# Patient Record
Sex: Male | Born: 1965 | Race: White | Hispanic: No | Marital: Single | Smoking: Former smoker
Health system: Southern US, Community
[De-identification: ages and names within clinical notes are randomized; demographics above are authoritative.]

## PROBLEM LIST (undated history)

## (undated) DIAGNOSIS — B353 Tinea pedis: Secondary | ICD-10-CM

## (undated) DIAGNOSIS — G4730 Sleep apnea, unspecified: Secondary | ICD-10-CM

## (undated) DIAGNOSIS — E785 Hyperlipidemia, unspecified: Secondary | ICD-10-CM

## (undated) DIAGNOSIS — J309 Allergic rhinitis, unspecified: Secondary | ICD-10-CM

## (undated) DIAGNOSIS — J439 Emphysema, unspecified: Secondary | ICD-10-CM

## (undated) DIAGNOSIS — B070 Plantar wart: Secondary | ICD-10-CM

## (undated) DIAGNOSIS — T7840XA Allergy, unspecified, initial encounter: Secondary | ICD-10-CM

## (undated) DIAGNOSIS — I10 Essential (primary) hypertension: Secondary | ICD-10-CM

## (undated) DIAGNOSIS — N401 Enlarged prostate with lower urinary tract symptoms: Secondary | ICD-10-CM

## (undated) DIAGNOSIS — D1801 Hemangioma of skin and subcutaneous tissue: Secondary | ICD-10-CM

## (undated) DIAGNOSIS — Z8619 Personal history of other infectious and parasitic diseases: Secondary | ICD-10-CM

## (undated) DIAGNOSIS — J45909 Unspecified asthma, uncomplicated: Secondary | ICD-10-CM

## (undated) DIAGNOSIS — C801 Malignant (primary) neoplasm, unspecified: Secondary | ICD-10-CM

## (undated) DIAGNOSIS — M6740 Ganglion, unspecified site: Secondary | ICD-10-CM

## (undated) HISTORY — DX: Personal history of other infectious and parasitic diseases: Z86.19

## (undated) HISTORY — PX: WISDOM TOOTH EXTRACTION: SHX21

## (undated) HISTORY — DX: Hyperlipidemia, unspecified: E78.5

## (undated) HISTORY — DX: Hemangioma of skin and subcutaneous tissue: D18.01

## (undated) HISTORY — DX: Benign prostatic hyperplasia with lower urinary tract symptoms: N40.1

## (undated) HISTORY — DX: Allergy, unspecified, initial encounter: T78.40XA

## (undated) HISTORY — DX: Sleep apnea, unspecified: G47.30

## (undated) HISTORY — DX: Unspecified asthma, uncomplicated: J45.909

## (undated) HISTORY — DX: Allergic rhinitis, unspecified: J30.9

## (undated) HISTORY — DX: Tinea pedis: B35.3

## (undated) HISTORY — DX: Plantar wart: B07.0

## (undated) HISTORY — DX: Ganglion, unspecified site: M67.40

---

## 1999-04-07 ENCOUNTER — Inpatient Hospital Stay (HOSPITAL_COMMUNITY): Admission: EM | Admit: 1999-04-07 | Discharge: 1999-04-09 | Payer: Self-pay | Admitting: Emergency Medicine

## 1999-04-07 ENCOUNTER — Encounter: Payer: Self-pay | Admitting: Emergency Medicine

## 2005-08-17 ENCOUNTER — Encounter: Admission: RE | Admit: 2005-08-17 | Discharge: 2005-08-17 | Payer: Self-pay | Admitting: Family Medicine

## 2005-09-09 ENCOUNTER — Encounter: Admission: RE | Admit: 2005-09-09 | Discharge: 2005-09-09 | Payer: Self-pay | Admitting: Family Medicine

## 2006-05-02 ENCOUNTER — Ambulatory Visit: Payer: Self-pay | Admitting: Family Medicine

## 2006-09-22 ENCOUNTER — Ambulatory Visit: Payer: Self-pay | Admitting: Internal Medicine

## 2006-11-08 ENCOUNTER — Ambulatory Visit: Payer: Self-pay | Admitting: Internal Medicine

## 2006-11-08 LAB — CONVERTED CEMR LAB
ALT: 28 units/L (ref 0–40)
AST: 20 units/L (ref 0–37)
Albumin: 4.3 g/dL (ref 3.5–5.2)
Alkaline Phosphatase: 53 units/L (ref 39–117)
BUN: 14 mg/dL (ref 6–23)
Basophils Absolute: 0 10*3/uL (ref 0.0–0.1)
Basophils Relative: 0.1 % (ref 0.0–1.0)
Bilirubin, Direct: 0.1 mg/dL (ref 0.0–0.3)
CO2: 30 meq/L (ref 19–32)
Calcium: 9.2 mg/dL (ref 8.4–10.5)
Chloride: 105 meq/L (ref 96–112)
Creatinine, Ser: 1 mg/dL (ref 0.4–1.5)
Eosinophils Absolute: 0.1 10*3/uL (ref 0.0–0.6)
Eosinophils Relative: 1.4 % (ref 0.0–5.0)
GFR calc Af Amer: 107 mL/min
GFR calc non Af Amer: 88 mL/min
Glucose, Bld: 85 mg/dL (ref 70–99)
HCT: 42.4 % (ref 39.0–52.0)
Hemoglobin: 14.9 g/dL (ref 13.0–17.0)
Lymphocytes Relative: 21 % (ref 12.0–46.0)
MCHC: 35.2 g/dL (ref 30.0–36.0)
MCV: 97.3 fL (ref 78.0–100.0)
Monocytes Absolute: 1.4 10*3/uL — ABNORMAL HIGH (ref 0.2–0.7)
Monocytes Relative: 17.2 % — ABNORMAL HIGH (ref 3.0–11.0)
Neutro Abs: 4.7 10*3/uL (ref 1.4–7.7)
Neutrophils Relative %: 60.3 % (ref 43.0–77.0)
PSA: 0.6 ng/mL (ref 0.10–4.00)
Platelets: 251 10*3/uL (ref 150–400)
Potassium: 4.1 meq/L (ref 3.5–5.1)
RBC: 4.36 M/uL (ref 4.22–5.81)
RDW: 12.3 % (ref 11.5–14.6)
Sodium: 139 meq/L (ref 135–145)
TSH: 3.1 microintl units/mL (ref 0.35–5.50)
Total Bilirubin: 0.8 mg/dL (ref 0.3–1.2)
Total Protein: 6.7 g/dL (ref 6.0–8.3)
WBC: 7.9 10*3/uL (ref 4.5–10.5)

## 2006-11-17 ENCOUNTER — Ambulatory Visit: Payer: Self-pay | Admitting: Internal Medicine

## 2007-02-05 DIAGNOSIS — Z8619 Personal history of other infectious and parasitic diseases: Secondary | ICD-10-CM

## 2007-02-05 DIAGNOSIS — J45909 Unspecified asthma, uncomplicated: Secondary | ICD-10-CM

## 2007-02-05 DIAGNOSIS — J4530 Mild persistent asthma, uncomplicated: Secondary | ICD-10-CM | POA: Insufficient documentation

## 2007-02-05 DIAGNOSIS — F411 Generalized anxiety disorder: Secondary | ICD-10-CM | POA: Insufficient documentation

## 2007-02-05 HISTORY — DX: Personal history of other infectious and parasitic diseases: Z86.19

## 2007-02-05 HISTORY — DX: Unspecified asthma, uncomplicated: J45.909

## 2007-03-01 ENCOUNTER — Telehealth: Payer: Self-pay | Admitting: Internal Medicine

## 2007-03-29 ENCOUNTER — Ambulatory Visit: Payer: Self-pay | Admitting: Internal Medicine

## 2007-05-22 ENCOUNTER — Ambulatory Visit: Payer: Self-pay | Admitting: Internal Medicine

## 2007-05-22 DIAGNOSIS — N138 Other obstructive and reflux uropathy: Secondary | ICD-10-CM | POA: Insufficient documentation

## 2007-05-22 DIAGNOSIS — N401 Enlarged prostate with lower urinary tract symptoms: Secondary | ICD-10-CM | POA: Insufficient documentation

## 2007-05-22 HISTORY — DX: Benign prostatic hyperplasia with lower urinary tract symptoms: N40.1

## 2007-05-22 HISTORY — DX: Other obstructive and reflux uropathy: N13.8

## 2007-08-16 ENCOUNTER — Encounter: Payer: Self-pay | Admitting: Internal Medicine

## 2007-08-17 ENCOUNTER — Ambulatory Visit: Payer: Self-pay | Admitting: Internal Medicine

## 2007-08-17 DIAGNOSIS — L723 Sebaceous cyst: Secondary | ICD-10-CM | POA: Insufficient documentation

## 2007-09-17 ENCOUNTER — Encounter: Payer: Self-pay | Admitting: Internal Medicine

## 2007-11-14 ENCOUNTER — Ambulatory Visit: Payer: Self-pay | Admitting: Internal Medicine

## 2007-12-27 ENCOUNTER — Telehealth: Payer: Self-pay | Admitting: Internal Medicine

## 2008-01-10 ENCOUNTER — Ambulatory Visit: Payer: Self-pay | Admitting: Internal Medicine

## 2008-01-10 LAB — CONVERTED CEMR LAB
ALT: 36 units/L (ref 0–53)
AST: 22 units/L (ref 0–37)
Albumin: 4.3 g/dL (ref 3.5–5.2)
Alkaline Phosphatase: 59 units/L (ref 39–117)
BUN: 20 mg/dL (ref 6–23)
Basophils Absolute: 0.1 10*3/uL (ref 0.0–0.1)
Basophils Relative: 0.9 % (ref 0.0–3.0)
Bilirubin Urine: NEGATIVE
Bilirubin, Direct: 0.1 mg/dL (ref 0.0–0.3)
Blood in Urine, dipstick: NEGATIVE
CO2: 30 meq/L (ref 19–32)
Calcium: 9.4 mg/dL (ref 8.4–10.5)
Chloride: 104 meq/L (ref 96–112)
Cholesterol: 193 mg/dL (ref 0–200)
Creatinine, Ser: 1.1 mg/dL (ref 0.4–1.5)
Eosinophils Absolute: 0.1 10*3/uL (ref 0.0–0.7)
Eosinophils Relative: 1.6 % (ref 0.0–5.0)
GFR calc Af Amer: 95 mL/min
GFR calc non Af Amer: 79 mL/min
Glucose, Bld: 88 mg/dL (ref 70–99)
Glucose, Urine, Semiquant: NEGATIVE
HCT: 46 % (ref 39.0–52.0)
HDL: 41.2 mg/dL (ref >39.0)
Hemoglobin: 16 g/dL (ref 13.0–17.0)
Ketones, urine, test strip: NEGATIVE
LDL Cholesterol: 137 mg/dL — ABNORMAL HIGH (ref 0–99)
Lymphocytes Relative: 28.1 % (ref 12.0–46.0)
MCHC: 34.7 g/dL (ref 30.0–36.0)
MCV: 100.2 fL — ABNORMAL HIGH (ref 78.0–100.0)
Monocytes Absolute: 1 10*3/uL (ref 0.1–1.0)
Monocytes Relative: 15.4 % — ABNORMAL HIGH (ref 3.0–12.0)
Neutro Abs: 3.3 10*3/uL (ref 1.4–7.7)
Neutrophils Relative %: 54 % (ref 43.0–77.0)
Nitrite: NEGATIVE
PSA: 0.45 ng/mL (ref 0.10–4.00)
Platelets: 293 10*3/uL (ref 150–400)
Potassium: 4.1 meq/L (ref 3.5–5.1)
Protein, U semiquant: NEGATIVE
RBC: 4.59 M/uL (ref 4.22–5.81)
RDW: 12.7 % (ref 11.5–14.6)
Sodium: 141 meq/L (ref 135–145)
Specific Gravity, Urine: 1.015
TSH: 1.76 microintl units/mL (ref 0.35–5.50)
Total Bilirubin: 1 mg/dL (ref 0.3–1.2)
Total CHOL/HDL Ratio: 4.7
Total Protein: 6.7 g/dL (ref 6.0–8.3)
Triglycerides: 76 mg/dL (ref 0–149)
Urobilinogen, UA: 1
VLDL: 15 mg/dL (ref 0–40)
WBC: 6.3 10*3/uL (ref 4.5–10.5)
pH: 7

## 2008-01-17 ENCOUNTER — Ambulatory Visit: Payer: Self-pay | Admitting: Internal Medicine

## 2008-03-27 ENCOUNTER — Ambulatory Visit: Payer: Self-pay | Admitting: Internal Medicine

## 2008-04-15 ENCOUNTER — Ambulatory Visit: Payer: Self-pay | Admitting: Internal Medicine

## 2008-04-15 LAB — CONVERTED CEMR LAB
Cholesterol: 184 mg/dL (ref 0–200)
HDL: 42.1 mg/dL (ref >39.0)
LDL Cholesterol: 130 mg/dL — ABNORMAL HIGH (ref 0–99)
Total CHOL/HDL Ratio: 4.4
Triglycerides: 62 mg/dL (ref 0–149)
VLDL: 12 mg/dL (ref 0–40)

## 2008-04-21 ENCOUNTER — Ambulatory Visit: Payer: Self-pay | Admitting: Internal Medicine

## 2008-04-21 DIAGNOSIS — I10 Essential (primary) hypertension: Secondary | ICD-10-CM | POA: Insufficient documentation

## 2008-04-21 DIAGNOSIS — E785 Hyperlipidemia, unspecified: Secondary | ICD-10-CM

## 2008-04-21 HISTORY — DX: Hyperlipidemia, unspecified: E78.5

## 2008-08-13 ENCOUNTER — Ambulatory Visit: Payer: Self-pay | Admitting: Internal Medicine

## 2008-08-13 LAB — CONVERTED CEMR LAB
ALT: 36 units/L (ref 0–53)
AST: 23 units/L (ref 0–37)
Albumin: 4.1 g/dL (ref 3.5–5.2)
Alkaline Phosphatase: 53 units/L (ref 39–117)
Bilirubin, Direct: 0.2 mg/dL (ref 0.0–0.3)
Cholesterol: 164 mg/dL (ref 0–200)
HDL: 41.2 mg/dL (ref >39.0)
LDL Cholesterol: 112 mg/dL — ABNORMAL HIGH (ref 0–99)
Total Bilirubin: 0.7 mg/dL (ref 0.3–1.2)
Total CHOL/HDL Ratio: 4
Total Protein: 6.4 g/dL (ref 6.0–8.3)
Triglycerides: 56 mg/dL (ref 0–149)
VLDL: 11 mg/dL (ref 0–40)

## 2008-08-20 ENCOUNTER — Telehealth: Payer: Self-pay | Admitting: Internal Medicine

## 2008-08-20 ENCOUNTER — Ambulatory Visit: Payer: Self-pay | Admitting: Internal Medicine

## 2009-01-16 ENCOUNTER — Ambulatory Visit: Payer: Self-pay | Admitting: Internal Medicine

## 2009-01-16 LAB — CONVERTED CEMR LAB
ALT: 29 units/L (ref 0–53)
AST: 19 units/L (ref 0–37)
Albumin: 4.3 g/dL (ref 3.5–5.2)
Alkaline Phosphatase: 55 units/L (ref 39–117)
BUN: 16 mg/dL (ref 6–23)
Basophils Absolute: 0 10*3/uL (ref 0.0–0.1)
Basophils Relative: 0 % (ref 0.0–3.0)
Bilirubin Urine: NEGATIVE
Bilirubin, Direct: 0 mg/dL (ref 0.0–0.3)
Blood in Urine, dipstick: NEGATIVE
CO2: 33 meq/L — ABNORMAL HIGH (ref 19–32)
Calcium: 9.3 mg/dL (ref 8.4–10.5)
Chloride: 106 meq/L (ref 96–112)
Cholesterol: 177 mg/dL (ref 0–200)
Creatinine, Ser: 1.1 mg/dL (ref 0.4–1.5)
Eosinophils Absolute: 0.1 10*3/uL (ref 0.0–0.7)
Eosinophils Relative: 0.9 % (ref 0.0–5.0)
GFR calc non Af Amer: 78.27 mL/min (ref >60)
Glucose, Bld: 92 mg/dL (ref 70–99)
Glucose, Urine, Semiquant: NEGATIVE
HCT: 44.2 % (ref 39.0–52.0)
HDL: 42.6 mg/dL (ref >39.00)
Hemoglobin: 15.4 g/dL (ref 13.0–17.0)
Ketones, urine, test strip: NEGATIVE
LDL Cholesterol: 118 mg/dL — ABNORMAL HIGH (ref 0–99)
Lymphocytes Relative: 24.3 % (ref 12.0–46.0)
Lymphs Abs: 1.5 10*3/uL (ref 0.7–4.0)
MCHC: 34.8 g/dL (ref 30.0–36.0)
MCV: 98.8 fL (ref 78.0–100.0)
Monocytes Absolute: 0.8 10*3/uL (ref 0.1–1.0)
Monocytes Relative: 12.9 % — ABNORMAL HIGH (ref 3.0–12.0)
Neutro Abs: 3.8 10*3/uL (ref 1.4–7.7)
Neutrophils Relative %: 61.9 % (ref 43.0–77.0)
Nitrite: NEGATIVE
PSA: 0.25 ng/mL (ref 0.10–4.00)
Platelets: 235 10*3/uL (ref 150.0–400.0)
Potassium: 4.3 meq/L (ref 3.5–5.1)
Protein, U semiquant: NEGATIVE
RBC: 4.47 M/uL (ref 4.22–5.81)
RDW: 12 % (ref 11.5–14.6)
Sodium: 144 meq/L (ref 135–145)
Specific Gravity, Urine: 1.02
TSH: 2.88 microintl units/mL (ref 0.35–5.50)
Total Bilirubin: 1 mg/dL (ref 0.3–1.2)
Total CHOL/HDL Ratio: 4
Total Protein: 6.8 g/dL (ref 6.0–8.3)
Triglycerides: 83 mg/dL (ref 0.0–149.0)
Urobilinogen, UA: 0.2
VLDL: 16.6 mg/dL (ref 0.0–40.0)
WBC Urine, dipstick: NEGATIVE
WBC: 6.2 10*3/uL (ref 4.5–10.5)
pH: 7

## 2009-01-23 ENCOUNTER — Ambulatory Visit: Payer: Self-pay | Admitting: Internal Medicine

## 2009-01-23 DIAGNOSIS — B070 Plantar wart: Secondary | ICD-10-CM | POA: Insufficient documentation

## 2009-01-23 HISTORY — DX: Plantar wart: B07.0

## 2009-02-24 ENCOUNTER — Ambulatory Visit: Payer: Self-pay | Admitting: Internal Medicine

## 2009-02-24 DIAGNOSIS — J309 Allergic rhinitis, unspecified: Secondary | ICD-10-CM

## 2009-02-24 HISTORY — DX: Allergic rhinitis, unspecified: J30.9

## 2009-03-25 ENCOUNTER — Ambulatory Visit: Payer: Self-pay | Admitting: Internal Medicine

## 2009-03-25 DIAGNOSIS — M6740 Ganglion, unspecified site: Secondary | ICD-10-CM | POA: Insufficient documentation

## 2009-03-25 HISTORY — DX: Ganglion, unspecified site: M67.40

## 2009-04-20 ENCOUNTER — Ambulatory Visit: Payer: Self-pay | Admitting: Internal Medicine

## 2009-06-30 ENCOUNTER — Ambulatory Visit: Payer: Self-pay | Admitting: Internal Medicine

## 2009-06-30 ENCOUNTER — Telehealth: Payer: Self-pay | Admitting: Internal Medicine

## 2009-06-30 DIAGNOSIS — D1801 Hemangioma of skin and subcutaneous tissue: Secondary | ICD-10-CM

## 2009-06-30 HISTORY — DX: Hemangioma of skin and subcutaneous tissue: D18.01

## 2009-08-03 ENCOUNTER — Ambulatory Visit: Payer: Self-pay | Admitting: Internal Medicine

## 2009-08-17 ENCOUNTER — Ambulatory Visit: Payer: Self-pay | Admitting: Internal Medicine

## 2009-08-18 ENCOUNTER — Encounter: Payer: Self-pay | Admitting: Internal Medicine

## 2009-09-07 ENCOUNTER — Ambulatory Visit: Payer: Self-pay | Admitting: Internal Medicine

## 2009-10-13 ENCOUNTER — Ambulatory Visit: Payer: Self-pay | Admitting: Internal Medicine

## 2009-11-02 ENCOUNTER — Ambulatory Visit: Payer: Self-pay | Admitting: Internal Medicine

## 2009-11-02 DIAGNOSIS — B353 Tinea pedis: Secondary | ICD-10-CM | POA: Insufficient documentation

## 2009-11-02 HISTORY — DX: Tinea pedis: B35.3

## 2009-11-30 ENCOUNTER — Ambulatory Visit: Payer: Self-pay | Admitting: Internal Medicine

## 2009-12-25 ENCOUNTER — Telehealth: Payer: Self-pay | Admitting: Internal Medicine

## 2010-01-01 ENCOUNTER — Ambulatory Visit: Payer: Self-pay | Admitting: Internal Medicine

## 2010-01-18 ENCOUNTER — Ambulatory Visit: Payer: Self-pay | Admitting: Internal Medicine

## 2010-01-18 LAB — CONVERTED CEMR LAB
ALT: 29 units/L (ref 0–53)
AST: 21 units/L (ref 0–37)
Albumin: 4.1 g/dL (ref 3.5–5.2)
Alkaline Phosphatase: 57 units/L (ref 39–117)
BUN: 19 mg/dL (ref 6–23)
Basophils Absolute: 0 10*3/uL (ref 0.0–0.1)
Basophils Relative: 0.5 % (ref 0.0–3.0)
Bilirubin, Direct: 0.1 mg/dL (ref 0.0–0.3)
Blood in Urine, dipstick: NEGATIVE
CO2: 32 meq/L (ref 19–32)
Calcium: 9 mg/dL (ref 8.4–10.5)
Chloride: 104 meq/L (ref 96–112)
Cholesterol: 184 mg/dL (ref 0–200)
Creatinine, Ser: 1.1 mg/dL (ref 0.4–1.5)
Eosinophils Absolute: 0.1 10*3/uL (ref 0.0–0.7)
Eosinophils Relative: 1.1 % (ref 0.0–5.0)
GFR calc non Af Amer: 77.08 mL/min (ref >60)
Glucose, Bld: 86 mg/dL (ref 70–99)
Glucose, Urine, Semiquant: NEGATIVE
HCT: 42.4 % (ref 39.0–52.0)
HDL: 47.1 mg/dL (ref >39.00)
Hemoglobin: 14.6 g/dL (ref 13.0–17.0)
Ketones, urine, test strip: NEGATIVE
LDL Cholesterol: 125 mg/dL — ABNORMAL HIGH (ref 0–99)
Lymphocytes Relative: 26.4 % (ref 12.0–46.0)
Lymphs Abs: 1.7 10*3/uL (ref 0.7–4.0)
MCHC: 34.6 g/dL (ref 30.0–36.0)
MCV: 99.3 fL (ref 78.0–100.0)
Monocytes Absolute: 0.9 10*3/uL (ref 0.1–1.0)
Monocytes Relative: 14.3 % — ABNORMAL HIGH (ref 3.0–12.0)
Neutro Abs: 3.7 10*3/uL (ref 1.4–7.7)
Neutrophils Relative %: 57.7 % (ref 43.0–77.0)
Nitrite: NEGATIVE
PSA: 0.53 ng/mL (ref 0.10–4.00)
Platelets: 253 10*3/uL (ref 150.0–400.0)
Potassium: 3.9 meq/L (ref 3.5–5.1)
Protein, U semiquant: NEGATIVE
RBC: 4.27 M/uL (ref 4.22–5.81)
RDW: 13.5 % (ref 11.5–14.6)
Sodium: 143 meq/L (ref 135–145)
Specific Gravity, Urine: >=1.03
TSH: 3.38 microintl units/mL (ref 0.35–5.50)
Total Bilirubin: 0.4 mg/dL (ref 0.3–1.2)
Total CHOL/HDL Ratio: 4
Total Protein: 6.5 g/dL (ref 6.0–8.3)
Triglycerides: 58 mg/dL (ref 0.0–149.0)
Urobilinogen, UA: 1
VLDL: 11.6 mg/dL (ref 0.0–40.0)
WBC Urine, dipstick: NEGATIVE
WBC: 6.4 10*3/uL (ref 4.5–10.5)
pH: 5.5

## 2010-01-21 LAB — CONVERTED CEMR LAB

## 2010-01-25 ENCOUNTER — Ambulatory Visit: Payer: Self-pay | Admitting: Internal Medicine

## 2010-07-11 LAB — CONVERTED CEMR LAB

## 2010-07-13 ENCOUNTER — Encounter: Payer: Self-pay | Admitting: Internal Medicine

## 2010-07-13 NOTE — Progress Notes (Signed)
Summary: additional lab test  Phone Note Call from Patient Call back at [phone]   Caller: Patient Call For: dr Lovell Sheehan Summary of Call: pt is sch for cpx labs on 01-10-08 pt would like to have hiv test included with labs.Can I sch ? Initial call taken by: Heron Sabins,  December 27, 2007 1:31 PM  Follow-up for Phone Call        yes per dr Lovell Sheehan Follow-up by: Willy Eddy, LPN,  December 27, 2007 1:59 PM  Additional Follow-up for Phone Call Additional follow up Details #1::        pt is aware Additional Follow-up by: Heron Sabins,  December 28, 2007 7:59 AM    0

## 2010-07-13 NOTE — Progress Notes (Signed)
Summary: additional lab test  Phone Note Call from Patient Call back at Home Phone [phone]   Caller: Patient Call For: jenkins Summary of Call: pt is sch for cpx labs 01-16-2009 pt would like hiv added to bloodwork can i add? Initial call taken by: Heron Sabins,  August 20, 2008 4:11 PM  Follow-up for Phone Call        yes you may per dr Lovell Sheehan Follow-up by: Willy Eddy, LPN,  August 20, 2008 4:21 PM  Additional Follow-up for Phone Call Additional follow up Details #1::        pt is aware Additional Follow-up by: Heron Sabins,  August 20, 2008 5:11 PM

## 2010-07-13 NOTE — Assessment & Plan Note (Signed)
Summary: 4 week fup//ccm   Vital Signs:  Patient profile:   45 year old male Height:      68 inches Weight:      161 pounds BMI:     24.57 Temp:     98.2 degrees F oral Pulse rate:   72 / minute Resp:     14 per minute BP sitting:   130 / 80  (left arm)  Vitals Entered By: Willy Eddy, LPN (November 30, 2009 12:10 PM) CC: check feet   CC:  check feet.  History of Present Illness: wart therapy  Preventive Screening-Counseling & Management  Alcohol-Tobacco     Smoking Status: current  Current Problems (verified): 1)  Dermatophytosis of Foot  (ICD-110.4) 2)  Hemangioma of Skin and Subcutaneous Tissue  (ICD-228.01) 3)  Ganglion Cyst, Wrist, Left  (ICD-727.41) 4)  Allergic Rhinitis Cause Unspecified  (ICD-477.9) 5)  Plantar Wart, Right  (ICD-078.12) 6)  Hyperlipidemia, Borderline  (ICD-272.4) 7)  Physical Examination  (ICD-V70.0) 8)  Uri  (ICD-465.9) 9)  Sebaceous Cyst, Scalp  (ICD-706.2) 10)  Benign Prostatic Hypertrophy, With Urinary Obstruction  (ICD-600.01) 11)  Hepatitis B, Hx of  (ICD-V12.09) 12)  Depression  (ICD-311) 13)  Asthma  (ICD-493.90)  Current Medications (verified): 1)  Wellbutrin Sr 150 Mg Tb12 (Bupropion Hcl) .... Take 2 Tablet By Mouth Once A Day 2)  Xanax 0.25 Mg Tabs (Alprazolam) .... 1/2 Tab As Needed 3)  Eq Ibuprofen 200 Mg  Caps (Ibuprofen) .... As Needed 4)  Aldara 5 % Crea (Imiquimod) .... To Research 5)  Xyzal 5 Mg Tabs (Levocetirizine Dihydrochloride) .... One  By Mouth Two Times A Day As Needed Sever Allergies ( Please Give Generic Drug) 6)  Lotrisone 1-0.05 % Lotn (Clotrimazole-Betamethasone) .... Generic Apply To Foot Bid  Allergies (verified): No Known Drug Allergies   Impression & Recommendations:  Problem # 1:  PLANTAR WART, RIGHT (ICD-078.12)  the lesion was identifies as a        plantar r warts  and 40 seconds of cryotherapy with the liguid nitrogen gun was apllied to the site. The pt tolerated the procedure and post  procedure care was discussed  Orders: No Charge Patient Arrived (NCPA0) (NCPA0) Wart Destruct <14 (17110)  Complete Medication List: 1)  Wellbutrin Sr 150 Mg Tb12 (Bupropion hcl) .... Take 2 tablet by mouth once a day 2)  Xanax 0.25 Mg Tabs (Alprazolam) .... 1/2 tab as needed 3)  Eq Ibuprofen 200 Mg Caps (Ibuprofen) .... As needed 4)  Aldara 5 % Crea (Imiquimod) .... To research 5)  Xyzal 5 Mg Tabs (Levocetirizine dihydrochloride) .... One  by mouth two times a day as needed sever allergies ( please give generic drug) 6)  Lotrisone 1-0.05 % Lotn (Clotrimazole-betamethasone) .... Generic apply to foot bid  Patient Instructions: 1)  Please schedule a follow-up appointment in 1 month.

## 2010-07-13 NOTE — Assessment & Plan Note (Signed)
Summary: 1 month rov/njr/check finger/add flu shot//ccm   Vital Signs:  Patient profile:   45 year old male Height:      68 inches Weight:      156 pounds BMI:     23.81 Temp:     98.2 degrees F oral Pulse rate:   72 / minute Resp:     14 per minute BP sitting:   110 / 80  (left arm)  Vitals Entered By: Willy Eddy, LPN (March 25, 2009 10:04 AM)  CC:  cryo therapy toe-pt tetanus vaccine was given in 2008-son has only been seen by dr Lovell Sheehan one time and there is no tetanus shot showing ) checked  registry and he was not listed.  History of Present Illness: Presents for follow up visit for medication management for asthma and lipids as well as acute complaits of ganglion cyst on wrist and a recurrent wart. New problems as noted. Use of medications inclused allergy and asthma rescue meds was stable   Follow-Up Visit      This is a 45 year old man who presents for Follow-up visit.  The patient denies chest pain, palpitations, dizziness, syncope, low blood sugar symptoms, high blood sugar symptoms, edema, SOB, DOE, PND, and orthopnea.  The patient reports taking meds as prescribed and dietary compliance.  When questioned about possible medication side effects, the patient notes none.    Problems Prior to Update: 1)  Ganglion Cyst, Wrist, Left  (ICD-727.41) 2)  Allergic Rhinitis Cause Unspecified  (ICD-477.9) 3)  Plantar Wart, Right  (ICD-078.12) 4)  Hyperlipidemia, Borderline  (ICD-272.4) 5)  Physical Examination  (ICD-V70.0) 6)  Uri  (ICD-465.9) 7)  Sebaceous Cyst, Scalp  (ICD-706.2) 8)  Benign Prostatic Hypertrophy, With Urinary Obstruction  (ICD-600.01) 9)  Hepatitis B, Hx of  (ICD-V12.09) 10)  Depression  (ICD-311) 11)  Asthma  (ICD-493.90)  Medications Prior to Update: 1)  Wellbutrin Sr 150 Mg Tb12 (Bupropion Hcl) .... Take 2 Tablet By Mouth Once A Day 2)  Xanax 0.25 Mg Tabs (Alprazolam) .... 1/2 Tab As Needed 3)  Avodart 0.5 Mg  Caps (Dutasteride) .... One By  Mouth Daily 4)  Eq Ibuprofen 200 Mg  Caps (Ibuprofen) .... As Needed 5)  Allegra 180 Mg Tabs (Fexofenadine Hcl) .Marland Kitchen.. 1 Tablet By Mouth Once Daily  Current Medications (verified): 1)  Wellbutrin Sr 150 Mg Tb12 (Bupropion Hcl) .... Take 2 Tablet By Mouth Once A Day 2)  Xanax 0.25 Mg Tabs (Alprazolam) .... 1/2 Tab As Needed 3)  Avodart 0.5 Mg  Caps (Dutasteride) .... One By Mouth Daily 4)  Eq Ibuprofen 200 Mg  Caps (Ibuprofen) .... As Needed  Allergies (verified): No Known Drug Allergies  Past History:  Family History: Last updated: 01/17/2008 Family History of Arthritis Family History Hypertension Family History of Prostate CA 1st degree relative <50 Family History of Stroke F 1st degree relative <60 pancreatic cancer  Social History: Last updated: 11/14/2007 Occupation: Divorced Current Smoker---episodic  Risk Factors: Smoking Status: current (02/05/2007)  Past medical, surgical, family and social histories (including risk factors) reviewed, and no changes noted (except as noted below).  Past Medical History: Reviewed history from 01/17/2008 and no changes required. 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 Denies surgical history  Family History: Reviewed history from 01/17/2008 and no changes required. Family History of Arthritis Family History Hypertension Family History of Prostate CA 1st degree  relative <50 Family History of Stroke F 1st degree relative <60 pancreatic cancer  Social History: Reviewed history from 11/14/2007 and no changes required. Occupation: Divorced Current Smoker---episodic  Review of Systems  The patient denies anorexia, fever, weight loss, weight gain, vision loss, decreased hearing, hoarseness, chest pain, syncope, dyspnea on exertion, peripheral edema, prolonged cough, headaches, hemoptysis, abdominal pain, melena,  hematochezia, severe indigestion/heartburn, hematuria, incontinence, genital sores, muscle weakness, suspicious skin lesions, transient blindness, difficulty walking, depression, unusual weight change, abnormal bleeding, enlarged lymph nodes, angioedema, breast masses, and testicular masses.         Flu Vaccine Consent Questions     Do you have a history of severe allergic reactions to this vaccine? no    Any prior history of allergic reactions to egg and/or gelatin? no    Do you have a sensitivity to the preservative Thimersol? no    Do you have a past history of Guillan-Barre Syndrome? no    Do you currently have an acute febrile illness? no    Have you ever had a severe reaction to latex? no    Vaccine information given and explained to patient? yes    Are you currently pregnant? no    Lot [id number]   Exp Date:09/10/2009   Manufacturer: [employer]    Site Given  Left Deltoid IM   Physical Exam  General:  Well-developed,well-nourished,in no acute distress; alert,appropriate and cooperative throughout examination Head:  normocephalic and no abnormalities observed.   Eyes:  pupils equal and pupils round.   Ears:  R ear normal and L ear normal.   Nose:  nasal dischargemucosal pallor, mucosal erythema, mucosal edema, and airflow obstruction.   Neck:  No deformities, masses, or tenderness noted. Lungs:  normal respiratory effort and no wheezes.   Heart:  normal rate and regular rhythm.   Abdomen:  soft and non-tender.   Msk:  normal ROM, joint tenderness, and joint swelling.  at site of cyst  Neurologic:  alert & oriented X3 and cranial nerves II-XII intact.     Impression & Recommendations:  Problem # 1:  PLANTAR WART, RIGHT (ICD-078.12) wart on right foot recurrent Orders:the lesion was identifies as a wart          and 40 seconds of cryotherapy with the liguid nitrogen gun was apllied to the site. The pt tolerated the procedure and post procedure care was discussed  Wart  Destruct <14 (17110)  Problem # 2:  GANGLION CYST, WRIST, LEFT (ICD-727.41)  Informed consent obtained and the site prepped with betadine, using a  18 guage nedle and syringe the cyst was drained  Orders: Joint Aspirate / Injection, Small (81191)  Problem # 3:  ASTHMA (ICD-493.90) stable reveiwd use of rescue inhailer  Problem # 4:  ALLERGIC RHINITIS CAUSE UNSPECIFIED (ICD-477.9) stable  Complete Medication List: 1)  Wellbutrin Sr 150 Mg Tb12 (Bupropion hcl) .... Take 2 tablet by mouth once a day 2)  Xanax 0.25 Mg Tabs (Alprazolam) .... 1/2 tab as needed 3)  Avodart 0.5 Mg Caps (Dutasteride) .... One by mouth daily 4)  Eq Ibuprofen 200 Mg Caps (Ibuprofen) .... As needed  Other Orders: Flu Vaccine 75yrs + (47829) Administration Flu vaccine - MCR (F6213)  Patient Instructions: 1)  Please schedule a follow-up appointment in 3 weeks.

## 2010-07-13 NOTE — Assessment & Plan Note (Signed)
Summary: 3-4 wk plantar wart therapy/njr   Vital Signs:  Patient profile:   45 year old male Weight:      154 pounds Temp:     98.0 degrees F Pulse rate:   77 / minute BP sitting:   110 / 70  CC:  URI symptoms.  History of Present Illness: Allergies with significnat vasomotor allergies  URI Symptoms      This is a 45 year old man who presents with URI symptoms.  The patient reports nasal congestion and clear nasal discharge.  The patient denies fever, low-grade fever (<100.5 degrees), fever of 100.5-103 degrees, fever of 103.1-104 degrees, fever to >104 degrees, stiff neck, dyspnea, wheezing, rash, vomiting, diarrhea, use of an antipyretic, and response to antipyretic.  The patient also reports itchy watery eyes, itchy throat, and sneezing.  The patient denies the following risk factors for Strep sinusitis: unilateral facial pain, unilateral nasal discharge, poor response to decongestant, double sickening, tooth pain, Strep exposure, tender adenopathy, and absence of cough.    Problems Prior to Update: 1)  Plantar Wart, Right  (ICD-078.12) 2)  Hyperlipidemia, Borderline  (ICD-272.4) 3)  Physical Examination  (ICD-V70.0) 4)  Uri  (ICD-465.9) 5)  Sebaceous Cyst, Scalp  (ICD-706.2) 6)  Benign Prostatic Hypertrophy, With Urinary Obstruction  (ICD-600.01) 7)  Hepatitis B, Hx of  (ICD-V12.09) 8)  Depression  (ICD-311) 9)  Asthma  (ICD-493.90)  Medications Prior to Update: 1)  Wellbutrin Sr 150 Mg Tb12 (Bupropion Hcl) .... Take 2 Tablet By Mouth Once A Day 2)  Xanax 0.25 Mg Tabs (Alprazolam) .... 1/2 Tab As Needed 3)  Avodart 0.5 Mg  Caps (Dutasteride) .... One By Mouth Daily 4)  Eq Ibuprofen 200 Mg  Caps (Ibuprofen) .... As Needed  Current Medications (verified): 1)  Wellbutrin Sr 150 Mg Tb12 (Bupropion Hcl) .... Take 2 Tablet By Mouth Once A Day 2)  Xanax 0.25 Mg Tabs (Alprazolam) .... 1/2 Tab As Needed 3)  Avodart 0.5 Mg  Caps (Dutasteride) .... One By Mouth Daily 4)  Eq Ibuprofen  200 Mg  Caps (Ibuprofen) .... As Needed  Allergies (verified): No Known Drug Allergies  Past History:  Family History: Last updated: 01/17/2008 Family History of Arthritis Family History Hypertension Family History of Prostate CA 1st degree relative <50 Family History of Stroke F 1st degree relative <60 pancreatic cancer  Social History: Last updated: 11/14/2007 Occupation: Divorced Current Smoker---episodic  Risk Factors: Smoking Status: current (02/05/2007)  Past medical, surgical, family and social histories (including risk factors) reviewed, and no changes noted (except as noted below).  Past Medical History: Reviewed history from 01/17/2008 and no changes required. 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 Denies surgical history  Family History: Reviewed history from 01/17/2008 and no changes required. Family History of Arthritis Family History Hypertension Family History of Prostate CA 1st degree relative <50 Family History of Stroke F 1st degree relative <60 pancreatic cancer  Social History: Reviewed history from 11/14/2007 and no changes required. Occupation: Divorced Current Smoker---episodic  Review of Systems  The patient denies anorexia, fever, weight loss, weight gain, vision loss, decreased hearing, hoarseness, chest pain, syncope, dyspnea on exertion, peripheral edema, prolonged cough, headaches, hemoptysis, abdominal pain, melena, hematochezia, hematuria, incontinence, genital sores, muscle weakness, suspicious skin lesions, transient blindness, difficulty walking, depression, unusual weight change, abnormal bleeding, enlarged lymph nodes, angioedema, and breast masses.    Physical Exam  General:  Well-developed,well-nourished,in no acute distress; alert,appropriate and cooperative throughout examination Ears:  External ear exam  shows no significant lesions or deformities.  Otoscopic examination reveals clear canals, tympanic membranes are intact bilaterally without bulging, retraction, inflammation or discharge. Hearing is grossly normal bilaterally. Nose:  nasal dischargemucosal pallor, mucosal erythema, mucosal edema, and airflow obstruction.   Mouth:  Oral mucosa and oropharynx without lesions or exudates.  Teeth in good repair. Neck:  No deformities, masses, or tenderness noted. Lungs:  Normal respiratory effort, chest expands symmetrically. Lungs are clear to auscultation, no crackles or wheezes. Heart:  Normal rate and regular rhythm. S1 and S2 normal without gallop, murmur, click, rub or other extra sounds. Skin:  wart on great to of rght fot   Impression & Recommendations:  Problem # 1:  ALLERGIC RHINITIS CAUSE UNSPECIFIED (ICD-477.9) Assessment Unchanged increased seasonla allergies Discussed use of allergy medications and environmental measures.   His updated medication list for this problem includes:    Allegra 180 Mg Tabs (Fexofenadine hcl) .Marland Kitchen... 1 tablet by mouth once daily  Problem # 2:  PLANTAR WART, RIGHT (ICD-078.12)  the lesion was identifies as a   planter wart       and 40 seconds of cryotherapy with the liguid nitrogen gun was apllied to the site. The pt tolerated the procedure and post procedure care was discussed  Orders: Cryotherapy/Destruction benign or premalignant lesion (1st lesion)  (17000)  Complete Medication List: 1)  Wellbutrin Sr 150 Mg Tb12 (Bupropion hcl) .... Take 2 tablet by mouth once a day 2)  Xanax 0.25 Mg Tabs (Alprazolam) .... 1/2 tab as needed 3)  Avodart 0.5 Mg Caps (Dutasteride) .... One by mouth daily 4)  Eq Ibuprofen 200 Mg Caps (Ibuprofen) .... As needed 5)  Allegra 180 Mg Tabs (Fexofenadine hcl) .Marland Kitchen.. 1 tablet by mouth once daily  Patient Instructions: 1)  Please schedule a follow-up appointment in 1 month. Prescriptions: ALLEGRA 180 MG TABS (FEXOFENADINE  HCL) 1 tablet by mouth once daily  #30 x 11   Entered and Authorized by:   Stacie Glaze MD   Signed by:   Stacie Glaze MD on 02/24/2009   Method used:   Electronically to        [facility] [address] [phone]* (retail)       [address]       [place], [place]  [address]       Ph: [phone] or [phone]       Fax: [phone]   RxID:   [id number]

## 2010-07-13 NOTE — Progress Notes (Signed)
Summary: ADDITIONAL LABS  Phone Note Call from Patient Call back at Home Phone [phone]   Caller: Patient Call For: Stacie Glaze MD Complaint: Urinary/GYN Problems Summary of Call: Pt is sch for cpx labs on 01-18-2010 pt would like psa and hiv added to lab work can I sch? Initial call taken by: Heron Sabins,  June 30, 2009 10:45 AM  Follow-up for Phone Call        yes Follow-up by: Willy Eddy, LPN,  June 30, 2009 10:46 AM  Additional Follow-up for Phone Call Additional follow up Details #1::        LABS HAS BEEN ADDED Additional Follow-up by: Heron Sabins,  June 30, 2009 11:00 AM

## 2010-07-13 NOTE — Assessment & Plan Note (Signed)
Summary: cpx/njr   Vital Signs:  Patient profile:   45 year old male Height:      68 inches Weight:      156 pounds BMI:     23.81 Temp:     98.3 degrees F oral Pulse rate:   76 / minute Resp:     12 per minute BP sitting:   120 / 80  (left arm)  Vitals Entered By: Willy Eddy, LPN (January 25, 2010 8:42 AM) CC: cpx Is Patient Diabetic? No   CC:  cpx.  History of Present Illness: The pt was asked about all immunizations, health maint. services that are appropriate to their age and was given guidance on diet exercize  and weight management   Preventive Screening-Counseling & Management  Alcohol-Tobacco     Smoking Status: current     Packs/Day: <1 pack a week     Tobacco Counseling: to quit use of tobacco products  Comments: pt thinks that episodic smoking is OK since he smokes less than one pack a week  Current Problems (verified): 1)  Dermatophytosis of Foot  (ICD-110.4) 2)  Hemangioma of Skin and Subcutaneous Tissue  (ICD-228.01) 3)  Ganglion Cyst, Wrist, Left  (ICD-727.41) 4)  Allergic Rhinitis Cause Unspecified  (ICD-477.9) 5)  Plantar Wart, Right  (ICD-078.12) 6)  Hyperlipidemia, Borderline  (ICD-272.4) 7)  Physical Examination  (ICD-V70.0) 8)  Uri  (ICD-465.9) 9)  Sebaceous Cyst, Scalp  (ICD-706.2) 10)  Benign Prostatic Hypertrophy, With Urinary Obstruction  (ICD-600.01) 11)  Hepatitis B, Hx of  (ICD-V12.09) 12)  Depression  (ICD-311) 13)  Asthma  (ICD-493.90)  Current Medications (verified): 1)  Wellbutrin Sr 150 Mg Tb12 (Bupropion Hcl) .... Take 2 Tablet By Mouth Once A Day 2)  Xanax 0.25 Mg Tabs (Alprazolam) .... 1/2 Tab As Needed 3)  Eq Ibuprofen 200 Mg  Caps (Ibuprofen) .... As Needed 4)  Aldara 5 % Crea (Imiquimod) .... To Research 5)  Xyzal 5 Mg Tabs (Levocetirizine Dihydrochloride) .... One  By Mouth Two Times A Day As Needed Sever Allergies ( Please Give Generic Drug) 6)  Lotrisone 1-0.05 % Lotn (Clotrimazole-Betamethasone) .... Generic  Apply To Foot Bid  Allergies (verified): No Known Drug Allergies  Past History:  Family History: Last updated: 01/17/2008 Family History of Arthritis Family History Hypertension Family History of Prostate CA 1st degree relative <50 Family History of Stroke F 1st degree relative <60 pancreatic cancer  Social History: Last updated: 11/14/2007 Occupation: Divorced Current Smoker---episodic  Risk Factors: Smoking Status: current (01/25/2010) Packs/Day: <1 pack a week (01/25/2010)  Past medical, surgical, family and social histories (including risk factors) reviewed, and no changes noted (except as noted below).  Past Medical History: Reviewed history from 01/17/2008 and no changes required. 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 Denies surgical history  Family History: Reviewed history from 01/17/2008 and no changes required. Family History of Arthritis Family History Hypertension Family History of Prostate CA 1st degree relative <50 Family History of Stroke F 1st degree relative <60 pancreatic cancer  Social History: Reviewed history from 11/14/2007 and no changes required. Occupation: Divorced Current Smoker---episodic Packs/Day:  <1 pack a week  Review of Systems  The patient denies anorexia, fever, weight loss, weight gain, vision loss, decreased hearing, hoarseness, chest pain, syncope, dyspnea on exertion, peripheral edema, prolonged cough, headaches, hemoptysis, abdominal pain, melena, hematochezia, severe indigestion/heartburn, hematuria, incontinence, genital sores, muscle weakness,  suspicious skin lesions, transient blindness, difficulty walking, depression, unusual weight change, abnormal bleeding, enlarged lymph nodes, angioedema, and breast masses.    Physical Exam  General:  Well-developed,well-nourished,in no acute distress;  alert,appropriate and cooperative throughout examination Head:  normocephalic and no abnormalities observed.   Eyes:  pupils equal and pupils round.   Ears:  R ear normal and no external deformities.   Nose:  nasal dischargemucosal pallor.   Mouth:  pharynx pink and moist and postnasal drip.   Neck:  No deformities, masses, or tenderness noted. Lungs:  normal respiratory effort and no wheezes.   Heart:  normal rate and regular rhythm.   Abdomen:  soft and non-tender.   Rectal:  no masses and external hemorrhoid(s).   Genitalia:  circumcised and no urethral discharge.   Prostate:  no gland enlargement and no asymmetry.   Msk:  No deformity or scoliosis noted of thoracic or lumbar spine.   Pulses:  R and L carotid,radial,femoral,dorsalis pedis and posterior tibial pulses are full and equal bilaterally Extremities:  No clubbing, cyanosis, edema, or deformity noted with normal full range of motion of all joints.     Impression & Recommendations:  Problem # 1:  PHYSICAL EXAMINATION (ICD-V70.0)  Td Booster: Historical (11/17/2006)   Flu Vax: Fluvax 3+ (03/25/2009)   Chol: 184 (01/18/2010)   HDL: 47.10 (01/18/2010)   LDL: 125 (01/18/2010)   TG: 58.0 (01/18/2010) TSH: 3.38 (01/18/2010)   PSA: 0.53 (01/18/2010)  Discussed using sunscreen, use of alcohol, drug use, self testicular exam, routine dental care, routine eye care, routine physical exam, seat belts, multiple vitamins, osteoporosis prevention, adequate calcium intake in diet, and recommendations for immunizations.  Discussed exercise and checking cholesterol.  Discussed gun safety, safe sex, and contraception. Also recommend checking PSA.  Problem # 2:  DEPRESSION (ICD-311) Assessment: Unchanged  His updated medication list for this problem includes:    Wellbutrin Sr 150 Mg Tb12 (Bupropion hcl) .Marland Kitchen... Take 2 tablet by mouth once a day    Xanax 0.25 Mg Tabs (Alprazolam) .Marland Kitchen... 1/2 tab as needed  Discussed treatment options, including  trial of antidpressant medication. Will refer to behavioral health. Follow-up call in in 24-48 hours and recheck in 2 weeks, sooner as needed. Patient agrees to call if any worsening of symptoms or thoughts of doing harm arise. Verified that the patient has no suicidal ideation at this time.   Complete Medication List: 1)  Wellbutrin Sr 150 Mg Tb12 (Bupropion hcl) .... Take 2 tablet by mouth once a day 2)  Xanax 0.25 Mg Tabs (Alprazolam) .... 1/2 tab as needed 3)  Eq Ibuprofen 200 Mg Caps (Ibuprofen) .... As needed 4)  Xyzal 5 Mg Tabs (Levocetirizine dihydrochloride) .... One  by mouth two times a day as needed sever allergies ( please give generic drug) 5)  Lotrisone 1-0.05 % Lotn (Clotrimazole-betamethasone) .... Generic apply to foot bid  Patient Instructions: 1)  Please schedule a follow-up appointment in 6 months.

## 2010-07-13 NOTE — Assessment & Plan Note (Signed)
Summary: 4 WK ROV // RS   Vital Signs:  Patient profile:   45 year old male Height:      68 inches Weight:      161 pounds BMI:     24.57 Temp:     98.1 degrees F oral Pulse rate:   72 / minute Resp:     14 per minute BP sitting:   136 / 80  (left arm)  Vitals Entered By: Willy Eddy, LPN (Oct 13, 1608 1:53 PM) CC: cryo rt foot   CC:  cryo rt foot.  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) 2)  Ganglion Cyst, Wrist, Left  (ICD-727.41) 3)  Allergic Rhinitis Cause Unspecified  (ICD-477.9) 4)  Plantar Wart, Right  (ICD-078.12) 5)  Hyperlipidemia, Borderline  (ICD-272.4) 6)  Physical Examination  (ICD-V70.0) 7)  Uri  (ICD-465.9) 8)  Sebaceous Cyst, Scalp  (ICD-706.2) 9)  Benign Prostatic Hypertrophy, With Urinary Obstruction  (ICD-600.01) 10)  Hepatitis B, Hx of  (ICD-V12.09) 11)  Depression  (ICD-311) 12)  Asthma  (ICD-493.90)  Current Problems (verified): 1)  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 Take 2 Tablet By Mouth Once A Day 2)  Xanax 0.25 Mg Tabs (Alprazolam) .... 1/2 Tab As Needed 3)  Eq Ibuprofen 200 Mg  Caps (Ibuprofen) .... As Needed 4)  Aldara 5 % Crea (Imiquimod) .... To Research 5)  Xyzal 5 Mg  Tabs (Levocetirizine Dihydrochloride) .... One  By Mouth Two Times A Day As Needed Sever Allergies ( Please Give Generic Drug)  Current Medications (verified): 1)  Wellbutrin Sr 150 Mg Tb12 (Bupropion Hcl) .... Take 2 Tablet By Mouth Once A Day 2)  Xanax 0.25 Mg Tabs (Alprazolam) .... 1/2 Tab As Needed 3)  Eq Ibuprofen 200 Mg  Caps (Ibuprofen) .... As Needed 4)  Aldara 5 % Crea (Imiquimod) .... To Research 5)  Xyzal 5 Mg Tabs (Levocetirizine Dihydrochloride) .... One  By Mouth Two Times A Day As Needed Sever Allergies ( Please Give Generic Drug)  Allergies (verified): No Known Drug Allergies  Past History:  Family History: Last updated: 01/17/2008 Family History of Arthritis Family History Hypertension Family History of Prostate CA 1st degree relative <50 Family History of Stroke F 1st degree relative <60 pancreatic cancer  Social History: Last updated: 11/14/2007 Occupation: Divorced Current Smoker---episodic  Risk Factors: Smoking Status: current (10/13/2009)  Past medical, surgical, family and social histories (including risk factors) reviewed for relevance to current acute and chronic problems.  Past Medical History: Reviewed history from 01/17/2008 and no changes required. 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 Denies surgical history  Family History: Reviewed history from 01/17/2008 and no  changes required. Family History of Arthritis Family History Hypertension Family History of Prostate CA 1st degree relative <50 Family History of Stroke F 1st degree relative <60 pancreatic cancer  Social History: Reviewed history from 11/14/2007 and no changes required. Occupation: Divorced Current Smoker---episodic  Review of Systems  The patient denies anorexia, fever, weight loss, weight gain, vision loss, decreased hearing, hoarseness, chest  pain, syncope, dyspnea on exertion, peripheral edema, prolonged cough, headaches, hemoptysis, abdominal pain, melena, hematochezia, severe indigestion/heartburn, hematuria, incontinence, genital sores, muscle weakness, suspicious skin lesions, transient blindness, difficulty walking, depression, unusual weight change, abnormal bleeding, enlarged lymph nodes, angioedema, and breast masses.    Physical Exam  General:  Well-developed,well-nourished,in no acute distress; alert,appropriate and cooperative throughout examination Ears:  R ear normal and no external deformities.   Mouth:  pharynx pink and moist and postnasal drip.   Neck:  No deformities, masses, or tenderness noted. Lungs:  normal respiratory effort and no wheezes.   Heart:  normal rate and regular rhythm.   Skin:   multiple plantar warts  2cm on great toe   Impression & Recommendations:  Problem # 1:  PLANTAR WART, RIGHT (ICD-078.12)  the lesion was identifies as a  multiple plantar ward on the right great toe        and 40 seconds of cryotherapy with the liguid nitrogen gun was apllied to the site. The pt tolerated the procedure and post procedure care was discussed  Orders: No Charge Patient Arrived (NCPA0) (NCPA0) Wart Destruct <14 (17110)  Complete Medication List: 1)  Wellbutrin Sr 150 Mg Tb12 (Bupropion hcl) .... Take 2 tablet by mouth once a day 2)  Xanax 0.25 Mg Tabs (Alprazolam) .... 1/2 tab as needed 3)  Eq Ibuprofen 200 Mg Caps (Ibuprofen) .... As needed 4)  Aldara 5 % Crea (Imiquimod) .... To research 5)  Xyzal 5 Mg Tabs (Levocetirizine dihydrochloride) .... One  by mouth two times a day as needed sever allergies ( please give generic drug)  Patient Instructions: 1)  Please schedule a follow-up appointment in 3 weeks.

## 2010-07-13 NOTE — Assessment & Plan Note (Signed)
Summary: roa/bmw   Vital Signs:  Patient profile:   45 year old male Height:      68 inches Weight:      158 pounds BMI:     24.11 Temp:     98.2 degrees F oral Pulse rate:   72 / minute Resp:     14 per minute BP sitting:   110 / 70  (left arm)  Vitals Entered By: Willy Eddy, LPN (April 20, 2009 4:34 PM)  CC:  roa- check wart on toe.  History of Present Illness: follow up wart treatment  Problems Prior to Update: 1)  Ganglion Cyst, Wrist, Left  (ICD-727.41) 2)  Allergic Rhinitis Cause Unspecified  (ICD-477.9) 3)  Plantar Wart, Right  (ICD-078.12) 4)  Hyperlipidemia, Borderline  (ICD-272.4) 5)  Physical Examination  (ICD-V70.0) 6)  Uri  (ICD-465.9) 7)  Sebaceous Cyst, Scalp  (ICD-706.2) 8)  Benign Prostatic Hypertrophy, With Urinary Obstruction  (ICD-600.01) 9)  Hepatitis B, Hx of  (ICD-V12.09) 10)  Depression  (ICD-311) 11)  Asthma  (ICD-493.90)  Medications Prior to Update: 1)  Wellbutrin Sr 150 Mg Tb12 (Bupropion Hcl) .... Take 2 Tablet By Mouth Once A Day 2)  Xanax 0.25 Mg Tabs (Alprazolam) .... 1/2 Tab As Needed 3)  Avodart 0.5 Mg  Caps (Dutasteride) .... One By Mouth Daily 4)  Eq Ibuprofen 200 Mg  Caps (Ibuprofen) .... As Needed  Current Medications (verified): 1)  Wellbutrin Sr 150 Mg Tb12 (Bupropion Hcl) .... Take 2 Tablet By Mouth Once A Day 2)  Xanax 0.25 Mg Tabs (Alprazolam) .... 1/2 Tab As Needed 3)  Avodart 0.5 Mg  Caps (Dutasteride) .... One By Mouth Daily 4)  Eq Ibuprofen 200 Mg  Caps (Ibuprofen) .... As Needed  Allergies (verified): No Known Drug Allergies  Past History:  Family History: Last updated: 01/17/2008 Family History of Arthritis Family History Hypertension Family History of Prostate CA 1st degree relative <50 Family History of Stroke F 1st degree relative <60 pancreatic cancer  Social History: Last updated: 11/14/2007 Occupation: Divorced Current Smoker---episodic  Risk Factors: Smoking Status: current  (02/05/2007)  Past medical, surgical, family and social histories (including risk factors) reviewed, and no changes noted (except as noted below). Past medical, surgical, family and social histories (including risk factors) reviewed for relevance to current acute and chronic problems.  Past Medical History: Reviewed history from 01/17/2008 and no changes required. 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 Denies surgical history  Family History: Reviewed history from 01/17/2008 and no changes required. Family History of Arthritis Family History Hypertension Family History of Prostate CA 1st degree relative <50 Family History of Stroke F 1st degree relative <60 pancreatic cancer  Social History: Reviewed history from 11/14/2007 and no changes required. Occupation: Divorced Current Smoker---episodic  Review of Systems  The patient denies anorexia, fever, weight loss, weight gain, vision loss, decreased hearing, hoarseness, chest pain, syncope, dyspnea on exertion, peripheral edema, prolonged cough, headaches, hemoptysis, abdominal pain, melena, hematochezia, severe indigestion/heartburn, hematuria, incontinence, genital sores, muscle weakness, suspicious skin lesions, transient blindness, difficulty walking, depression, unusual weight change, abnormal bleeding, enlarged lymph nodes, angioedema, breast masses, and testicular masses.    Physical Exam  General:  Well-developed,well-nourished,in no acute distress; alert,appropriate and cooperative throughout examination Eyes:  pupils equal and pupils round.   Nose:  nasal dischargemucosal pallor, mucosal erythema, mucosal edema, and airflow obstruction.   Mouth:  Oral mucosa and oropharynx without lesions or exudates.  Teeth in good repair. Lungs:  normal respiratory effort and no wheezes.   Heart:  normal rate and  regular rhythm.   Msk:  No deformity or scoliosis noted of thoracic or lumbar spine.   Skin:  plantar wart(s).   Cervical Nodes:  No lymphadenopathy noted Axillary Nodes:  No palpable lymphadenopathy   Impression & Recommendations:  Problem # 1:  GANGLION CYST, WRIST, LEFT (ICD-727.41) persistant  Problem # 2:  PLANTAR WART, RIGHT (ICD-078.12)  the lesion was identifies as a     plantar wart     and 40 seconds of cryotherapy with the liguid nitrogen gun was apllied to the site. The pt tolerated the procedure and post procedure care was discussed  Orders: Wart Destruct <14 (17110)  Complete Medication List: 1)  Wellbutrin Sr 150 Mg Tb12 (Bupropion hcl) .... Take 2 tablet by mouth once a day 2)  Xanax 0.25 Mg Tabs (Alprazolam) .... 1/2 tab as needed 3)  Avodart 0.5 Mg Caps (Dutasteride) .... One by mouth daily 4)  Eq Ibuprofen 200 Mg Caps (Ibuprofen) .... As needed  Patient Instructions: 1)  Please schedule a follow-up appointment in 1 month.  Appended Document: roa/bmw     History of Present Illness: the pt notes a painfull gaingion cyst on his wrist and wants to discuss treatment obtions while he is here for wart treatments. the cyst did not respond well to the attempted extraction and he is considering a referral to a hand surgeon. We discussed the criterior for a referal and the surgical procedure needed to extract the cyst. he will consider this and contact our office for the referral.  Allergies: No Known Drug Allergies   Complete Medication List: 1)  Wellbutrin Sr 150 Mg Tb12 (Bupropion hcl) .... Take 2 tablet by mouth once a day 2)  Xanax 0.25 Mg Tabs (Alprazolam) .... 1/2 tab as needed 3)  Avodart 0.5 Mg Caps (Dutasteride) .... One by mouth daily 4)  Eq Ibuprofen 200 Mg Caps (Ibuprofen) .... As needed

## 2010-07-13 NOTE — Assessment & Plan Note (Signed)
Summary: flu shot/njr  Nurse Visit    Prior Medications: MOBIC 15 MG TABS (MELOXICAM) Take 1 tablet by mouth once a day WELLBUTRIN SR 150 MG TB12 (BUPROPION HCL) Take 2 tablet by mouth once a day XANAX 0.25 MG TABS (ALPRAZOLAM)  FLEXERIL 10 MG  TABS (CYCLOBENZAPRINE HCL)  MOBIC 15 MG TABS (MELOXICAM) Take 1 tablet by mouth once a day WELLBUTRIN SR 150 MG TB12 (BUPROPION HCL) Take 2 tablet by mouth once a day XANAX 0.25 MG TABS (ALPRAZOLAM)  MOBIC 15 MG TABS (MELOXICAM) Take 1 tablet by mouth once a day WELLBUTRIN SR 150 MG TB12 (BUPROPION HCL) Take 2 tablet by mouth once a day XANAX 0.25 MG TABS (ALPRAZOLAM) as needed Current Allergies: No known allergies    Influenza Vaccine    Vaccine Type: Fluvax Non-MCR    Given by: Romualdo Bolk, CMA  Flu Vaccine Consent Questions    Do you have a history of severe allergic reactions to this vaccine? no    Any prior history of allergic reactions to egg and/or gelatin? no    Do you have a sensitivity to the preservative Thimersol? no    Do you have a past history of Guillan-Barre Syndrome? no    Do you currently have an acute febrile illness? no    Have you ever had a severe reaction to latex? no    Vaccine information given and explained to patient? yes   Orders Added: 1)  Influenza Vaccine NON MCR [00028]   Impression & Recommendations:  lot [id number], EXP 30 jun 09, sanofi pasteur left deltoid IM, 0.5 cc. ..................................................................Marland KitchenRomualdo Bolk, CMA  March 29, 2007 4:10 PM   Complete Medication List: 1)  Mobic 15 Mg Tabs (Meloxicam) .... Take 1 tablet by mouth once a day 2)  Wellbutrin Sr 150 Mg Tb12 (Bupropion hcl) .... Take 2 tablet by mouth once a day 3)  Xanax 0.25 Mg Tabs (Alprazolam) 4)  Flexeril 10 Mg Tabs (Cyclobenzaprine hcl) 5)  Mobic 15 Mg Tabs (Meloxicam) .... Take 1 tablet by mouth once a day 6)  Wellbutrin Sr 150 Mg Tb12 (Bupropion hcl) .... Take 2 tablet by  mouth once a day 7)  Xanax 0.25 Mg Tabs (Alprazolam) 8)  Mobic 15 Mg Tabs (Meloxicam) .... Take 1 tablet by mouth once a day 9)  Wellbutrin Sr 150 Mg Tb12 (Bupropion hcl) .... Take 2 tablet by mouth once a day 10)  Xanax 0.25 Mg Tabs (Alprazolam) .... As needed    ]

## 2010-07-13 NOTE — Assessment & Plan Note (Signed)
Summary: COUGH AND CONGESTION/MHF   Vital Signs:  Patient Profile:   45 Years Old Male Temp:     98.7 degrees F Pulse rate:   80 / minute BP sitting:   128 / 86  (left arm)  Vitals Entered By: Gladis Riffle, RN (November 14, 2007 2:49 PM)                 Chief Complaint:  c/o cold x 1 1/2 weeks, cough and post nasal drip, and URI symptoms.  History of Present Illness:  URI Symptoms ("same thing happens every year")      This is a 45 year old man who presents with URI symptoms.  The symptoms began 10 days ago.  The severity is described as mild.  The patient reports nasal congestion and dry cough, but denies clear nasal discharge, purulent nasal discharge, sore throat, productive cough, earache, and sick contacts.  The patient denies fever, stiff neck, dyspnea, and wheezing.  The patient denies itchy watery eyes, itchy throat, sneezing, seasonal symptoms, response to antihistamine, headache, muscle aches, and severe fatigue.  The patient denies the following risk factors for Strep sinusitis: unilateral facial pain, unilateral nasal discharge, poor response to decongestant, double sickening, tooth pain, Strep exposure, tender adenopathy, and absence of cough.    Past Medical History: Asthma Depression Hepatitis A and B, hx of  Past Surgical History: Denies surgical history  Social History: Occupation: Divorced Current Smoker  Family History: Family History of Arthritis Family History Hypertension Family History of Prostate CA 1st degree relative <50 Family History of Stroke F 1st degree relative <60   Current Meds:  WELLBUTRIN SR 150 MG TB12 (BUPROPION HCL) Take 2 tablet by mouth once a day XANAX 0.25 MG TABS (ALPRAZOLAM) 1/2 tab as needed AVODART 0.5 MG  CAPS (DUTASTERIDE) one by mouth daily       Current Allergies (reviewed today): No known allergies    Social History:    Occupation:    Divorced    Current Smoker---episodic    Review of Systems       no other  complaints in a complete ROS    Physical Exam  General:     Well-developed,well-nourished,in no acute distress; alert,appropriate and cooperative throughout examination Ears:     External ear exam shows no significant lesions or deformities.  Otoscopic examination reveals clear canals, tympanic membranes are intact bilaterally without bulging, retraction, inflammation or discharge. Hearing is grossly normal bilaterally. Nose:     External nasal examination shows no deformity or inflammation. Nasal mucosa are pink and moist without lesions or exudates. Mouth:     Oral mucosa and oropharynx without lesions or exudates.  Teeth in good repair. Neck:     No deformities, masses, or tenderness noted. Chest Wall:     No deformities, masses, tenderness or gynecomastia noted. Lungs:     Normal respiratory effort, chest expands symmetrically. Lungs are clear to auscultation, no crackles or wheezes.    Impression & Recommendations:  Problem # 1:  URI (ICD-465.9) no evidence of bacterial infection. call for any concerns, increased sxs, fever, persistence of sxs, wheeze, SOB.   His updated medication list for this problem includes:    Maxichlor Dm 4-20 Mg Tabs (Chlorpheniramine-dm) .Marland Kitchen... Take 1 tablet by mouth two times a day   Complete Medication List: 1)  Wellbutrin Sr 150 Mg Tb12 (Bupropion hcl) .... Take 2 tablet by mouth once a day 2)  Xanax 0.25 Mg Tabs (Alprazolam) .... 1/2 tab  as needed 3)  Avodart 0.5 Mg Caps (Dutasteride) .... One by mouth daily 4)  Omnaris 50 Mcg/act Susp (Ciclesonide) .Marland Kitchen.. 1 spray each nostril once daily 5)  Maxichlor Dm 4-20 Mg Tabs (Chlorpheniramine-dm) .... Take 1 tablet by mouth two times a day  Anticoagulation Management Assessment/Plan:        Prescriptions: MAXICHLOR DM 4-20 MG  TABS (CHLORPHENIRAMINE-DM) Take 1 tablet by mouth two times a day  #20 x 0   Entered and Authorized by:   Birdie Sons MD   Signed by:   Birdie Sons MD on 11/14/2007    Method used:   Electronically sent to ...       [facility]       [address]       [place], [place]  [address]       Ph: [phone]       Fax: [phone]   RxID:   [id number]  ]  Appended Document: COUGH AND CONGESTION/MHF

## 2010-07-13 NOTE — Assessment & Plan Note (Signed)
Summary: cyst removal/mhf rsc per pt/njr   Vital Signs:  Patient Profile:   45 Years Old Male Temp:     98.2 degrees F Pulse rate:   76 / minute BP sitting:   110 / 70  (left arm)  Vitals Entered By: Willy Eddy, LPN (August 17, 2023 8:33 AM)                 Chief Complaint:  removal of cyst in scalp.  History of Present Illness: Current Problems:  presents for cyst removal SEBACEOUS CYST, SCALP (ICD-706.2) BENIGN PROSTATIC HYPERTROPHY, WITH URINARY OBSTRUCTION (ICD-600.01) HEPATITIS B, HX OF (ICD-V12.09) DEPRESSION (ICD-311) ASTHMA (ICD-493.90)      Current Allergies: No known allergies   Past Medical History:    Reviewed history from 02/05/2007 and no changes required:       Asthma       Depression       Hepatitis A and B, hx of  Past Surgical History:    Reviewed history from 02/05/2007 and no changes required:       Denies surgical history   Family History:    Reviewed history from 02/05/2007 and no changes required:       Family History of Arthritis       Family History Hypertension       Family History of Prostate CA 1st degree relative <50       Family History of Stroke F 1st degree relative <60  Social History:    Reviewed history from 02/05/2007 and no changes required:       Occupation:       Divorced       Current Smoker    Review of Systems  The patient denies anorexia, fever, weight loss, weight gain, vision loss, decreased hearing, hoarseness, chest pain, syncope, dyspnea on exhertion, peripheral edema, prolonged cough, hemoptysis, abdominal pain, melena, hematochezia, severe indigestion/heartburn, hematuria, incontinence, genital sores, muscle weakness, suspicious skin lesions, transient blindness, difficulty walking, depression, unusual weight change, abnormal bleeding, enlarged lymph nodes, angioedema, breast masses, and testicular masses.     Physical Exam  Head:     cyst about 2 cm on scaplp    Impression &  Recommendations:  Problem # 1:  SEBACEOUS CYST, SCALP (ICD-706.2) remaove and close ith sutures Orders: Surgical Suture Tray (K2706) I&D Abscess, Simple / Single (10060)   Problem # 2:  BENIGN PROSTATIC HYPERTROPHY, WITH URINARY OBSTRUCTION (ICD-600.01) avodart rx  Complete Medication List: 1)  Wellbutrin Sr 150 Mg Tb12 (Bupropion hcl) .... Take 2 tablet by mouth once a day 2)  Xanax 0.25 Mg Tabs (Alprazolam) .... 1/2 tab as needed 3)  Avodart 0.5 Mg Caps (Dutasteride) .... One by mouth daily     Prescriptions: AVODART 0.5 MG  CAPS (DUTASTERIDE) one by mouth daily  #30 x 11   Entered and Authorized by:   Stacie Glaze MD   Signed by:   Stacie Glaze MD on 08/17/2007   Method used:   Print then Give to Patient   RxID:   [id number]  ]

## 2010-07-13 NOTE — Assessment & Plan Note (Signed)
Summary: 3 WK ROV/NJR   Vital Signs:  Patient profile:   45 year old male Height:      68 inches Weight:      161 pounds BMI:     24.57 Temp:     98.2 degrees F oral Pulse rate:   72 / minute Resp:     14 per minute BP sitting:   136 / 80  (left arm)  Vitals Entered By: Willy Eddy, LPN (September 07, 2009 8:26 AM) CC: roa- cryo foot   CC:  roa- cryo foot.  Preventive Screening-Counseling & Management  Alcohol-Tobacco     Smoking Status: current  Current Problems (verified): 1)  Hemangioma of Skin and Subcutaneous Tissue  (ICD-228.01) 2)  Ganglion Cyst, Wrist, Left  (ICD-727.41) 3)  Allergic Rhinitis Cause Unspecified  (ICD-477.9) 4)  Plantar Wart, Right  (ICD-078.12) 5)  Hyperlipidemia, Borderline  (ICD-272.4) 6)  Physical Examination  (ICD-V70.0) 7)  Uri  (ICD-465.9) 8)  Sebaceous Cyst, Scalp  (ICD-706.2) 9)  Benign Prostatic Hypertrophy, With Urinary Obstruction  (ICD-600.01) 10)  Hepatitis B, Hx of  (ICD-V12.09) 11)  Depression  (ICD-311) 12)  Asthma  (ICD-493.90)  Current Medications (verified): 1)  Wellbutrin Sr 150 Mg Tb12 (Bupropion Hcl) .... Take 2 Tablet By Mouth Once A Day 2)  Xanax 0.25 Mg Tabs (Alprazolam) .... 1/2 Tab As Needed 3)  Eq Ibuprofen 200 Mg  Caps (Ibuprofen) .... As Needed 4)  Aldara 5 % Crea (Imiquimod) .... To Research 5)  Xyzal 5 Mg Tabs (Levocetirizine Dihydrochloride) .... One  By Mouth Two Times A Day As Needed Sever Allergies ( Please Give Generic Drug)  Allergies (verified): No Known Drug Allergies  Past History:  Family History: Last updated: 01/17/2008 Family History of Arthritis Family History Hypertension Family History of Prostate CA 1st degree relative <50 Family History of Stroke F 1st degree relative <60 pancreatic cancer  Social History: Last updated: 11/14/2007 Occupation: Divorced Current Smoker---episodic  Risk Factors: Smoking Status: current (09/07/2009)  Past medical, surgical, family and social  histories (including risk factors) reviewed, and no changes noted (except as noted below).  Past Medical History: Reviewed history from 01/17/2008 and no changes required. 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 Denies surgical history  Family History: Reviewed history from 01/17/2008 and no changes required. Family History of Arthritis Family History Hypertension Family History of Prostate CA 1st degree relative <50 Family History of Stroke F 1st degree relative <60 pancreatic cancer  Social History: Reviewed history from 11/14/2007 and no changes required. Occupation: Divorced Current Smoker---episodic  Review of Systems  The patient denies anorexia, fever, weight loss, weight gain, vision loss, decreased hearing, hoarseness, chest pain, syncope, dyspnea on exertion, peripheral edema, prolonged cough, headaches, hemoptysis, abdominal pain, melena, hematochezia, severe indigestion/heartburn, hematuria, incontinence, genital sores, muscle weakness, suspicious skin lesions, transient blindness, difficulty walking, depression, unusual weight change, abnormal bleeding, enlarged lymph nodes, angioedema, breast masses, and testicular masses.    Physical Exam  General:  Well-developed,well-nourished,in no acute distress; alert,appropriate and cooperative throughout examination Head:  normocephalic and no abnormalities observed.   Eyes:  pupils equal and pupils round.   Ears:  R ear normal and no external deformities.   Nose:  nasal dischargemucosal pallor.   Neck:  No deformities, masses, or tenderness noted. Lungs:  normal respiratory effort and no wheezes.   Heart:  normal rate and regular rhythm.  Abdomen:  soft and non-tender.   Msk:  No deformity or scoliosis noted of thoracic or lumbar spine.   Pulses:  R and L carotid,radial,femoral,dorsalis pedis and  posterior tibial pulses are full and equal bilaterally Skin:  plantar ward 2cm on great toe   Impression & Recommendations:  Problem # 1:  PLANTAR WART, RIGHT (ICD-078.12)  follow up in four weeks  the lesion was identifies as a  platar wart(s)        and 40 seconds of cryotherapy with the liguid nitrogen gun was apllied to the site. The pt tolerated the procedure and post procedure care was discussed foru warts on right great toe  Orders: No Charge Patient Arrived (NCPA0) (NCPA0) Wart Destruct <14 (17110)  Problem # 2:  ASTHMA (ICD-493.90) stable  Complete Medication List: 1)  Wellbutrin Sr 150 Mg Tb12 (Bupropion hcl) .... Take 2 tablet by mouth once a day 2)  Xanax 0.25 Mg Tabs (Alprazolam) .... 1/2 tab as needed 3)  Eq Ibuprofen 200 Mg Caps (Ibuprofen) .... As needed 4)  Aldara 5 % Crea (Imiquimod) .... To research 5)  Xyzal 5 Mg Tabs (Levocetirizine dihydrochloride) .... One  by mouth two times a day as needed sever allergies ( please give generic drug)  Patient Instructions: 1)  Please schedule a follow-up appointment in 4 weeks.

## 2010-07-13 NOTE — Medication Information (Signed)
Summary: Coverage Approval for Levocetirizi  Coverage Approval for Levocetirizi   Imported By: Maryln Gottron 08/21/2009 09:41:46  _____________________________________________________________________  External Attachment:    Type:   Image     Comment:   External Document

## 2010-07-13 NOTE — Assessment & Plan Note (Signed)
Summary: 3 wk rov/njr   Vital Signs:  Patient profile:   45 year old male Height:      68 inches Weight:      161 pounds BMI:     24.57 Temp:     98.2 degrees F oral Pulse rate:   721 / minute Resp:     14 per minute BP sitting:   130 / 80  (left arm)  Vitals Entered By: Willy Eddy, LPN (Nov 02, 2009 12:33 PM) CC: check foot   CC:  check foot.  History of Present Illness: skin irrtated on heal consistant with dermatosis  Preventive Screening-Counseling & Management  Alcohol-Tobacco     Smoking Status: current  Current Problems (verified): 1)  Hemangioma of Skin and Subcutaneous Tissue  (ICD-228.01) 2)  Ganglion Cyst, Wrist, Left  (ICD-727.41) 3)  Allergic Rhinitis Cause Unspecified  (ICD-477.9) 4)  Plantar Wart, Right  (ICD-078.12) 5)  Hyperlipidemia, Borderline  (ICD-272.4) 6)  Physical Examination  (ICD-V70.0) 7)  Uri  (ICD-465.9) 8)  Sebaceous Cyst, Scalp  (ICD-706.2) 9)  Benign Prostatic Hypertrophy, With Urinary Obstruction  (ICD-600.01) 10)  Hepatitis B, Hx of  (ICD-V12.09) 11)  Depression  (ICD-311) 12)  Asthma  (ICD-493.90)  Current Medications (verified): 1)  Wellbutrin Sr 150 Mg Tb12 (Bupropion Hcl) .... Take 2 Tablet By Mouth Once A Day 2)  Xanax 0.25 Mg Tabs (Alprazolam) .... 1/2 Tab As Needed 3)  Eq Ibuprofen 200 Mg  Caps (Ibuprofen) .... As Needed 4)  Aldara 5 % Crea (Imiquimod) .... To Research 5)  Xyzal 5 Mg Tabs (Levocetirizine Dihydrochloride) .... One  By Mouth Two Times A Day As Needed Sever Allergies ( Please Give Generic Drug)  Allergies (verified): No Known Drug Allergies  Past History:  Family History: Last updated: 01/17/2008 Family History of Arthritis Family History Hypertension Family History of Prostate CA 1st degree relative <50 Family History of Stroke F 1st degree relative <60 pancreatic cancer  Social History: Last updated: 11/14/2007 Occupation: Divorced Current Smoker---episodic  Risk Factors: Smoking  Status: current (11/02/2009)  Past medical, surgical, family and social histories (including risk factors) reviewed, and no changes noted (except as noted below).  Past Medical History: Reviewed history from 01/17/2008 and no changes required. 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 Denies surgical history  Family History: Reviewed history from 01/17/2008 and no changes required. Family History of Arthritis Family History Hypertension Family History of Prostate CA 1st degree relative <50 Family History of Stroke F 1st degree relative <60 pancreatic cancer  Social History: Reviewed history from 11/14/2007 and no changes required. Occupation: Divorced Current Smoker---episodic  Review of Systems  The patient denies anorexia, fever, weight loss, weight gain, vision loss, decreased hearing, hoarseness, chest pain, syncope, dyspnea on exertion, peripheral edema, prolonged cough, headaches, hemoptysis, abdominal pain, melena, hematochezia, severe indigestion/heartburn, hematuria, incontinence, genital sores, muscle weakness, suspicious skin lesions, transient blindness, difficulty walking, depression, unusual weight change, abnormal bleeding, enlarged lymph nodes, angioedema, and breast masses.    Physical Exam  General:  Well-developed,well-nourished,in no acute distress; alert,appropriate and cooperative throughout examination Head:  normocephalic and no abnormalities observed.   Eyes:  pupils equal and pupils round.   Ears:  R ear normal and no external deformities.   Nose:  nasal dischargemucosal pallor.   Mouth:  pharynx pink and moist and postnasal drip.   Neck:  No deformities, masses, or tenderness noted.  Lungs:  normal respiratory effort and no wheezes.   Heart:  normal rate and regular rhythm.   Abdomen:  soft and non-tender.   Msk:  No deformity or  scoliosis noted of thoracic or lumbar spine.   Pulses:  R and L carotid,radial,femoral,dorsalis pedis and posterior tibial pulses are full and equal bilaterally Extremities:  No clubbing, cyanosis, edema, or deformity noted with normal full range of motion of all joints.   Neurologic:  alert & oriented X3 and cranial nerves II-XII intact.     Impression & Recommendations:  Problem # 1:  PLANTAR WART, RIGHT (ICD-078.12)  Problem # 2:  DERMATOPHYTOSIS OF FOOT (ICD-110.4)  His updated medication list for this problem includes:    Lotrisone 1-0.05 % Lotn (Clotrimazole-betamethasone) .Marland Kitchen... Generic apply to foot bid  Complete Medication List: 1)  Wellbutrin Sr 150 Mg Tb12 (Bupropion hcl) .... Take 2 tablet by mouth once a day 2)  Xanax 0.25 Mg Tabs (Alprazolam) .... 1/2 tab as needed 3)  Eq Ibuprofen 200 Mg Caps (Ibuprofen) .... As needed 4)  Aldara 5 % Crea (Imiquimod) .... To research 5)  Xyzal 5 Mg Tabs (Levocetirizine dihydrochloride) .... One  by mouth two times a day as needed sever allergies ( please give generic drug) 6)  Lotrisone 1-0.05 % Lotn (Clotrimazole-betamethasone) .... Generic apply to foot bid  Patient Instructions: 1)  Please schedule a follow-up appointment in 4 months. Prescriptions: LOTRISONE 1-0.05 % LOTN (CLOTRIMAZOLE-BETAMETHASONE) generic apply to foot BID  #60cc x 0   Entered and Authorized by:   Stacie Glaze MD   Signed by:   Stacie Glaze MD on 11/02/2009   Method used:   Electronically to        [facility] [address] [phone]* (retail)       [address]       [place], [place]  [address]       Ph: [phone] or [phone]       Fax: [phone]   RxID:   [id number] LOTRISONE 1-0.05 % LOTN (CLOTRIMAZOLE-BETAMETHASONE) generic apply to foot BID  #60cc x 0   Entered and Authorized by:   Stacie Glaze MD   Signed by:   Stacie Glaze MD on 11/02/2009   Method used:   Print then Give to Patient   RxID:   [id number]

## 2010-07-13 NOTE — Assessment & Plan Note (Signed)
Summary: CPX/NJR   Vital Signs:  Patient Profile:   45 Years Old Male Height:     68 inches Weight:      154 pounds BMI:     23.50 Temp:     98.5 degrees F oral Pulse rate:   72 / minute Resp:     14 per minute BP sitting:   120 / 80  (left arm)  Vitals Entered By: Willy Eddy, LPN (January 17, 2008 2:26 PM)                 Chief Complaint:  cpx-dt in 2008.  History of Present Illness:  Follow-Up Visit      This is a 45 year old man who presents for Follow-up visit.  CPX disciusson of the avodart and  prostate symptoms.  The patient denies chest pain, palpitations, dizziness, syncope, low blood sugar symptoms, high blood sugar symptoms, edema, SOB, DOE, PND, and orthopnea.  Since the last visit the patient notes no new problems or concerns.  The patient reports taking meds as prescribed.      Current Allergies: No known allergies   Past Medical History:    Reviewed history from 02/05/2007 and no changes required:       Asthma       Depression       Hepatitis A and B, hx of  immunization       enlarger prostate  Past Surgical History:    Reviewed history from 02/05/2007 and no changes required:       Denies surgical history   Family History:    Reviewed history from 02/05/2007 and no changes required:       Family History of Arthritis       Family History Hypertension       Family History of Prostate CA 1st degree relative <50       Family History of Stroke F 1st degree relative <60       pancreatic cancer  Social History:    Reviewed history from 11/14/2007 and no changes required:       Occupation:       Divorced       Current Smoker---episodic    Review of Systems  The patient denies anorexia, fever, weight loss, weight gain, vision loss, decreased hearing, hoarseness, chest pain, syncope, dyspnea on exertion, peripheral edema, prolonged cough, headaches, hemoptysis, abdominal pain, melena, hematochezia, severe indigestion/heartburn, hematuria,  incontinence, genital sores, muscle weakness, suspicious skin lesions, transient blindness, difficulty walking, depression, unusual weight change, abnormal bleeding, enlarged lymph nodes, angioedema, breast masses, and testicular masses.     Physical Exam  General:     Well-developed,well-nourished,in no acute distress; alert,appropriate and cooperative throughout examination Head:     normocephalic and no abnormalities observed.   Eyes:     No corneal or conjunctival inflammation noted. EOMI. Perrla. Funduscopic exam benign, without hemorrhages, exudates or papilledema. Vision grossly normal. Ears:     External ear exam shows no significant lesions or deformities.  Otoscopic examination reveals clear canals, tympanic membranes are intact bilaterally without bulging, retraction, inflammation or discharge. Hearing is grossly normal bilaterally. Nose:     External nasal examination shows no deformity or inflammation. Nasal mucosa are pink and moist without lesions or exudates. Mouth:     Oral mucosa and oropharynx without lesions or exudates.  Teeth in good repair. Neck:     No deformities, masses, or tenderness noted. Breasts:     No masses or  gynecomastia noted Lungs:     Normal respiratory effort, chest expands symmetrically. Lungs are clear to auscultation, no crackles or wheezes. Heart:     Normal rate and regular rhythm. S1 and S2 normal without gallop, murmur, click, rub or other extra sounds. Abdomen:     Bowel sounds positive,abdomen soft and non-tender without masses, organomegaly or hernias noted. Rectal:     No external abnormalities noted. Normal sphincter tone. No rectal masses or tenderness. Genitalia:     Testes bilaterally descended without nodularity, tenderness or masses. No scrotal masses or lesions. No penis lesions or urethral discharge. Msk:     No deformity or scoliosis noted of thoracic or lumbar spine.   Pulses:     R and L carotid,radial,femoral,dorsalis  pedis and posterior tibial pulses are full and equal bilaterally Extremities:     No clubbing, cyanosis, edema, or deformity noted with normal full range of motion of all joints.   Neurologic:     No cranial nerve deficits noted. Station and gait are normal. Plantar reflexes are down-going bilaterally. DTRs are symmetrical throughout. Sensory, motor and coordinative functions appear intact.    Impression & Recommendations:  Problem # 1:  PHYSICAL EXAMINATION (ICD-V70.0)  Reviewed preventive care protocols, scheduled due services, and updated immunizations.   Problem # 2:  ASTHMA (ICD-493.90)  Problem # 3:  ASTHMA (ICD-493.90)  Complete Medication List: 1)  Wellbutrin Sr 150 Mg Tb12 (Bupropion hcl) .... Take 2 tablet by mouth once a day 2)  Xanax 0.25 Mg Tabs (Alprazolam) .... 1/2 tab as needed 3)  Avodart 0.5 Mg Caps (Dutasteride) .... One by mouth daily 4)  Eq Ibuprofen 200 Mg Caps (Ibuprofen) .... As needed   Patient Instructions: 1)  Please schedule a follow-up appointment in 3 months. 2)  Lipid Panel prior to visit, ICD-9:   ]

## 2010-07-13 NOTE — Assessment & Plan Note (Signed)
Summary: 6 MOLNTH ROA/JLS rsc per doc/njr   Vital Signs:  Patient Profile:   45 Years Old Male Weight:      158.75 pounds Temp:     98.4 degrees F BP sitting:   100 / 74  Vitals Entered By: Sindy Guadeloupe RN (May 22, 2007 8:36 AM)                 Chief Complaint:  fu.  History of Present Illness: stopped proscar due to ingrown hairs on chest.... was using the proscar for the prostate.    Current Allergies (reviewed today): No known allergies   Past Medical History:    Reviewed history from 02/05/2007 and no changes required:       Asthma       Depression       Hepatitis A and B, hx of  Past Surgical History:    Reviewed history from 02/05/2007 and no changes required:       Denies surgical history   Family History:    Reviewed history from 02/05/2007 and no changes required:       Family History of Arthritis       Family History Hypertension       Family History of Prostate CA 1st degree relative <50       Family History of Stroke F 1st degree relative <60  Social History:    Reviewed history from 02/05/2007 and no changes required:       Occupation:       Divorced       Current Smoker    Review of Systems  The patient denies anorexia, fever, weight loss, vision loss, decreased hearing, hoarseness, chest pain, syncope, dyspnea on exhertion, peripheral edema, prolonged cough, hemoptysis, abdominal pain, melena, hematochezia, severe indigestion/heartburn, hematuria, incontinence, genital sores, muscle weakness, suspicious skin lesions, transient blindness, difficulty walking, depression, unusual weight change, abnormal bleeding, enlarged lymph nodes, angioedema, breast masses, and testicular masses.     Physical Exam  General:     Well-developed,well-nourished,in no acute distress; alert,appropriate and cooperative throughout examination Msk:     No deformity or scoliosis noted of thoracic or lumbar spine.   Skin:     cyst on crown of  scalp Cervical Nodes:     No lymphadenopathy noted Axillary Nodes:     No palpable lymphadenopathy    Impression & Recommendations:  Problem # 1:  BENIGN PROSTATIC HYPERTROPHY, WITH URINARY OBSTRUCTION (ICD-600.01) avodart trial with hx of response to [proscar  Problem # 2:  DEPRESSION (ICD-311)  The following medications were removed from the medication list:    Wellbutrin Sr 150 Mg Tb12 (Bupropion hcl) .Marland Kitchen... Take 2 tablet by mouth once a day    Xanax 0.25 Mg Tabs (Alprazolam)    Wellbutrin Sr 150 Mg Tb12 (Bupropion hcl) .Marland Kitchen... Take 2 tablet by mouth once a day    Xanax 0.25 Mg Tabs (Alprazolam) .Marland Kitchen... As needed  His updated medication list for this problem includes:    Wellbutrin Sr 150 Mg Tb12 (Bupropion hcl) .Marland Kitchen... Take 2 tablet by mouth once a day    Xanax 0.25 Mg Tabs (Alprazolam) .Marland Kitchen... 1/2 tab as needed Discussed treatment options, including trial of antidpressant medication. Will refer to behavioral health. Follow-up call in in 24-48 hours and recheck in 2 weeks, sooner as needed. Patient agrees to call if any worsening of symptoms or thoughts of doing harm arise. Verified that the patient has no suicidal ideation at this time.   Complete  Medication List: 1)  Wellbutrin Sr 150 Mg Tb12 (Bupropion hcl) .... Take 2 tablet by mouth once a day 2)  Xanax 0.25 Mg Tabs (Alprazolam) .... 1/2 tab as needed 3)  Avodart 0.5 Mg Caps (Dutasteride) .... One by mouth daily   Patient Instructions: 1)  Please schedule a follow-up appointment in 2 months. 2)  scalp bx and cyst removal    Prescriptions: AVODART 0.5 MG  CAPS (DUTASTERIDE) one by mouth daily  #30 x 11   Entered and Authorized by:   Stacie Glaze MD   Signed by:   Stacie Glaze MD on 05/22/2007   Method used:   Print then Give to Patient   RxID:   [id number]  ]

## 2010-07-13 NOTE — Assessment & Plan Note (Signed)
Summary: ROA/RCD   Vital Signs:  Patient Profile:   45 Years Old Male Height:     68 inches Weight:      158 pounds Temp:     98.5 degrees F oral Pulse rate:   72 / minute Resp:     14 per minute BP sitting:   120 / 80  (left arm)  Vitals Entered By: Willy Eddy, LPN (April 21, 2008 8:19 AM)                 Chief Complaint:  roa labs.    Current Allergies: No known allergies   Past Medical History:    Reviewed history from 01/17/2008 and no changes required:       Asthma       Depression       Hepatitis A and B, hx of  immunization       enlarger prostate  Past Surgical History:    Reviewed history from 02/05/2007 and no changes required:       Denies surgical history   Family History:    Reviewed history from 01/17/2008 and no changes required:       Family History of Arthritis       Family History Hypertension       Family History of Prostate CA 1st degree relative <50       Family History of Stroke F 1st degree relative <60       pancreatic cancer  Social History:    Reviewed history from 11/14/2007 and no changes required:       Occupation:       Divorced       Current Smoker---episodic    Review of Systems  The patient denies anorexia, fever, weight loss, weight gain, vision loss, decreased hearing, hoarseness, chest pain, syncope, dyspnea on exertion, peripheral edema, prolonged cough, headaches, hemoptysis, abdominal pain, melena, hematochezia, severe indigestion/heartburn, hematuria, incontinence, genital sores, muscle weakness, suspicious skin lesions, transient blindness, difficulty walking, depression, unusual weight change, abnormal bleeding, enlarged lymph nodes, angioedema, and breast masses.     Physical Exam  General:     Well-developed,well-nourished,in no acute distress; alert,appropriate and cooperative throughout examination Head:     normocephalic and no abnormalities observed.   Eyes:     No corneal or conjunctival  inflammation noted. EOMI. Perrla. Funduscopic exam benign, without hemorrhages, exudates or papilledema. Vision grossly normal. Ears:     External ear exam shows no significant lesions or deformities.  Otoscopic examination reveals clear canals, tympanic membranes are intact bilaterally without bulging, retraction, inflammation or discharge. Hearing is grossly normal bilaterally. Nose:     External nasal examination shows no deformity or inflammation. Nasal mucosa are pink and moist without lesions or exudates. Mouth:     Oral mucosa and oropharynx without lesions or exudates.  Teeth in good repair. Neck:     No deformities, masses, or tenderness noted. Lungs:     Normal respiratory effort, chest expands symmetrically. Lungs are clear to auscultation, no crackles or wheezes. Heart:     Normal rate and regular rhythm. S1 and S2 normal without gallop, murmur, click, rub or other extra sounds. Abdomen:     Bowel sounds positive,abdomen soft and non-tender without masses, organomegaly or hernias noted. Rectal:     No external abnormalities noted. Normal sphincter tone. No rectal masses or tenderness. Genitalia:     Testes bilaterally descended without nodularity, tenderness or masses. No scrotal masses or lesions. No penis lesions  or urethral discharge.    Impression & Recommendations:  Problem # 1:  HYPERLIPIDEMIA, BORDERLINE (ICD-272.4)  Labs Reviewed: Chol: 184 (04/15/2008)   HDL: 42.1 (04/15/2008)   LDL: 130 (04/15/2008)   TG: 62 (04/15/2008) SGOT: 22 (01/10/2008)   SGPT: 36 (01/10/2008)   Problem # 2:  DEPRESSION (ICD-311)  His updated medication list for this problem includes:    Wellbutrin Sr 150 Mg Tb12 (Bupropion hcl) .Marland Kitchen... Take 2 tablet by mouth once a day    Xanax 0.25 Mg Tabs (Alprazolam) .Marland Kitchen... 1/2 tab as needed Discussed treatment options, including trial of antidpressant medication. Will refer to behavioral health. Follow-up call in in 24-48 hours and recheck in 2 weeks,  sooner as needed. Patient agrees to call if any worsening of symptoms or thoughts of doing harm arise. Verified that the patient has no suicidal ideation at this time.   Problem # 3:  ASTHMA (ICD-493.90) stable  Complete Medication List: 1)  Wellbutrin Sr 150 Mg Tb12 (Bupropion hcl) .... Take 2 tablet by mouth once a day 2)  Xanax 0.25 Mg Tabs (Alprazolam) .... 1/2 tab as needed 3)  Avodart 0.5 Mg Caps (Dutasteride) .... One by mouth daily 4)  Eq Ibuprofen 200 Mg Caps (Ibuprofen) .... As needed   Patient Instructions: 1)  consider omega 3, flax seed oil and or kril oil 2)  watch the saturated fats in meats 3)  Please schedule a follow-up appointment in 4 months. 4)  Hepatic Panel prior to visit, ICD-9:995.20 5)  Lipid Panel prior to visit, ICD-9:272.4   ]

## 2010-07-13 NOTE — Medication Information (Signed)
Summary: authorization  authorization   Imported By: Kassie Mends 08/17/2007 11:09:31  _____________________________________________________________________  External Attachment:    Type:   Image     Comment:   authorization

## 2010-07-13 NOTE — Medication Information (Signed)
Summary: authorization  authorization   Imported By: Kassie Mends 09/17/2007 15:43:40  _____________________________________________________________________  External Attachment:    Type:   Image     Comment:   authorization

## 2010-07-13 NOTE — Assessment & Plan Note (Signed)
Summary: cpx/njr   Vital Signs:  Patient profile:   45 year old male Height:      68 inches Weight:      154 pounds BMI:     23.50 Temp:     98.2 degrees F oral Pulse rate:   72 / minute Resp:     14 per minute BP sitting:   110 / 70  (left arm)  Vitals Entered By: Willy Eddy, LPN (January 23, 2009 8:48 AM)  CC:  cpx.  History of Present Illness: CPX The pt was asked about all immunizations, health maint. services that are appropriate to their age and was given guidance on diet exercize  and weight management   Problems Prior to Update: 1)  Hyperlipidemia, Borderline  (ICD-272.4) 2)  Physical Examination  (ICD-V70.0) 3)  Uri  (ICD-465.9) 4)  Sebaceous Cyst, Scalp  (ICD-706.2) 5)  Benign Prostatic Hypertrophy, With Urinary Obstruction  (ICD-600.01) 6)  Hepatitis B, Hx of  (ICD-V12.09) 7)  Depression  (ICD-311) 8)  Asthma  (ICD-493.90)  Medications Prior to Update: 1)  Wellbutrin Sr 150 Mg Tb12 (Bupropion Hcl) .... Take 2 Tablet By Mouth Once A Day 2)  Xanax 0.25 Mg Tabs (Alprazolam) .... 1/2 Tab As Needed 3)  Avodart 0.5 Mg  Caps (Dutasteride) .... One By Mouth Daily 4)  Eq Ibuprofen 200 Mg  Caps (Ibuprofen) .... As Needed  Current Medications (verified): 1)  Wellbutrin Sr 150 Mg Tb12 (Bupropion Hcl) .... Take 2 Tablet By Mouth Once A Day 2)  Xanax 0.25 Mg Tabs (Alprazolam) .... 1/2 Tab As Needed 3)  Avodart 0.5 Mg  Caps (Dutasteride) .... One By Mouth Daily 4)  Eq Ibuprofen 200 Mg  Caps (Ibuprofen) .... As Needed  Allergies (verified): No Known Drug Allergies  Past History:  Family History: Last updated: 01/17/2008 Family History of Arthritis Family History Hypertension Family History of Prostate CA 1st degree relative <50 Family History of Stroke F 1st degree relative <60 pancreatic cancer  Social History: Last updated: 11/14/2007 Occupation: Divorced Current Smoker---episodic  Risk Factors: Smoking Status: current (02/05/2007)  Past medical,  surgical, family and social histories (including risk factors) reviewed, and no changes noted (except as noted below).  Past Medical History: Reviewed history from 01/17/2008 and no changes required. 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 Denies surgical history  Family History: Reviewed history from 01/17/2008 and no changes required. Family History of Arthritis Family History Hypertension Family History of Prostate CA 1st degree relative <50 Family History of Stroke F 1st degree relative <60 pancreatic cancer  Social History: Reviewed history from 11/14/2007 and no changes required. Occupation: Divorced Current Smoker---episodic  Review of Systems  The patient denies anorexia, fever, weight loss, weight gain, vision loss, decreased hearing, hoarseness, chest pain, syncope, dyspnea on exertion, peripheral edema, prolonged cough, headaches, hemoptysis, abdominal pain, melena, hematochezia, severe indigestion/heartburn, hematuria, incontinence, genital sores, muscle weakness, suspicious skin lesions, transient blindness, difficulty walking, depression, unusual weight change, abnormal bleeding, enlarged lymph nodes, angioedema, breast masses, and testicular masses.         possible hemmorhiod  Physical Exam  General:  Well-developed,well-nourished,in no acute distress; alert,appropriate and cooperative throughout examination Head:  normocephalic and no abnormalities observed.   Eyes:  No corneal or conjunctival inflammation noted. EOMI. Perrla. Funduscopic exam benign, without hemorrhages, exudates or papilledema. Vision grossly normal. Ears:  External ear exam shows no significant lesions  or deformities.  Otoscopic examination reveals clear canals, tympanic membranes are intact bilaterally without bulging, retraction, inflammation or discharge. Hearing is grossly  normal bilaterally. Nose:  External nasal examination shows no deformity or inflammation. Nasal mucosa are pink and moist without lesions or exudates. Mouth:  Oral mucosa and oropharynx without lesions or exudates.  Teeth in good repair. Neck:  No deformities, masses, or tenderness noted. Chest Wall:  No deformities, masses, tenderness or gynecomastia noted. Lungs:  Normal respiratory effort, chest expands symmetrically. Lungs are clear to auscultation, no crackles or wheezes. Heart:  Normal rate and regular rhythm. S1 and S2 normal without gallop, murmur, click, rub or other extra sounds. Abdomen:  Bowel sounds positive,abdomen soft and non-tender without masses, organomegaly or hernias noted. Rectal:  no masses and external hemorrhoid(s).   Genitalia:  circumcised and no urethral discharge.   Prostate:  no gland enlargement and no asymmetry.   Msk:  No deformity or scoliosis noted of thoracic or lumbar spine.   Pulses:  R and L carotid,radial,femoral,dorsalis pedis and posterior tibial pulses are full and equal bilaterally Extremities:  No clubbing, cyanosis, edema, or deformity noted with normal full range of motion of all joints.   Neurologic:  No cranial nerve deficits noted. Station and gait are normal. Plantar reflexes are down-going bilaterally. DTRs are symmetrical throughout. Sensory, motor and coordinative functions appear intact. Skin:  turgor normal and no suspicious lesions.   Cervical Nodes:  No lymphadenopathy noted Axillary Nodes:  No palpable lymphadenopathy Inguinal Nodes:  No significant adenopathy Psych:  Cognition and judgment appear intact. Alert and cooperative with normal attention span and concentration. No apparent delusions, illusions, hallucinations   Impression & Recommendations:  Problem # 1:  PHYSICAL EXAMINATION (ICD-V70.0) Assessment Unchanged The pt was asked about all immunizations, health maint. services that are appropriate to their age and was given  guidance on diet exercize  and weight management  Td Booster: Historical (11/17/2006)   Flu Vax: Fluvax 3+ (03/27/2008)   Chol: 177 (01/16/2009)   HDL: 42.60 (01/16/2009)   LDL: 118 (01/16/2009)   TG: 83.0 (01/16/2009) TSH: 2.88 (01/16/2009)   PSA: 0.25 (01/16/2009)  Discussed using sunscreen, use of alcohol, drug use, self testicular exam, routine dental care, routine eye care, routine physical exam, seat belts, multiple vitamins, osteoporosis prevention, adequate calcium intake in diet, and recommendations for immunizations.  Discussed exercise and checking cholesterol.  Discussed gun safety, safe sex, and contraception. Also recommend checking PSA.  Problem # 2:  PLANTAR WART, RIGHT (EAV-409.81) Assessment: New  the lesion was identifies as a   plantar wart       and 40 seconds of cryotherapy with the liguid nitrogen gun was apllied to the site. The pt tolerated the procedure and post procedure care was discussed  Orders: Wart Destruct <14 (17110)  Complete Medication List: 1)  Wellbutrin Sr 150 Mg Tb12 (Bupropion hcl) .... Take 2 tablet by mouth once a day 2)  Xanax 0.25 Mg Tabs (Alprazolam) .... 1/2 tab as needed 3)  Avodart 0.5 Mg Caps (Dutasteride) .... One by mouth daily 4)  Eq Ibuprofen 200 Mg Caps (Ibuprofen) .... As needed  Patient Instructions: 1)  kril  oil  1000 mg two times a day 2)  return for plantar wart therapy in 3-4 weeks Prescriptions: XANAX 0.25 MG TABS (ALPRAZOLAM) 1/2 tab as needed  #30 x 6   Entered and Authorized by:   Stacie Glaze MD   Signed by:   Stacie Glaze MD on  01/23/2009   Method used:   Print then Give to Patient   RxID:   [id number]

## 2010-07-13 NOTE — Progress Notes (Signed)
  Medications Added MOBIC 15 MG TABS (MELOXICAM) Take 1 tablet by mouth once a day WELLBUTRIN SR 150 MG TB12 (BUPROPION HCL) Take 2 tablet by mouth once a day XANAX 0.25 MG TABS (ALPRAZOLAM) as needed       Phone Note Call from Patient Call back at [phone]   Caller: Patient Call For: [facility] Summary of Call: PT NEED A WRITTEN RX FOR WELLBUTRIN SR 150 MG (GENERIC) 1 TAB B.I.D BY MOUTH & XANAX 25 MG Q4h p.r.n. [facility] [address] Initial call taken by: Shan Levans,  March 01, 2007 9:05 AM  Follow-up for Phone Call        ok to give Follow-up by: Stacie Glaze MD,  March 01, 2007 11:55 AM    New/Updated Medications: MOBIC 15 MG TABS (MELOXICAM) Take 1 tablet by mouth once a day WELLBUTRIN SR 150 MG TB12 (BUPROPION HCL) Take 2 tablet by mouth once a day XANAX 0.25 MG TABS (ALPRAZOLAM) as needed   Prescriptions: XANAX 0.25 MG TABS (ALPRAZOLAM) as needed  #60 x 1   Entered by:   Willy Eddy, LPN   Authorized by:   Stacie Glaze MD   Signed by:   Willy Eddy, LPN on 09/81/1914   Method used:   Print then Give to Patient   RxID:   [id number] WELLBUTRIN SR 150 MG TB12 (BUPROPION HCL) Take 2 tablet by mouth once a day  #60 x 3   Entered and Authorized by:   Willy Eddy, LPN   Signed by:   Willy Eddy, LPN on 69/62/9528   Method used:   Print then Give to Patient   RxID:   [id number]     Appended Document:  Robin at [facility] [phone] called to verify that Xanax0.25mg  is q 4hr.  Usually it is given three times a day or q 6 - 8 hrs.  Returned call.  Dr. York Ram order is q4hr as needed #60. ...................................................................{USER.REALNAME}  {DATETIMESTAMP()}

## 2010-07-13 NOTE — Assessment & Plan Note (Signed)
Summary: FU ON TOE/NJR   Vital Signs:  Patient profile:   45 year old male Height:      68 inches Weight:      160 pounds BMI:     24.42 Temp:     98.2 degrees F oral Pulse rate:   72 / minute Resp:     14 per minute BP sitting:   136 / 80  (left arm)  Vitals Entered By: Willy Eddy, LPN (June 30, 2009 10:07 AM) CC: cryo rt foot -wart-pt states he stopped all meds   CC:  cryo rt foot -wart-pt states he stopped all meds.  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 Take 2 Tablet By Mouth Once A Day 2)  Xanax 0.25 Mg Tabs (Alprazolam) .... 1/2 Tab As Needed 3)  Avodart 0.5 Mg  Caps (Dutasteride) .... One By Mouth Daily 4)  Eq Ibuprofen 200 Mg  Caps (Ibuprofen) .... As Needed  Current Medications (verified): 1)  Wellbutrin Sr 150 Mg Tb12 (Bupropion Hcl) .... Take 2 Tablet By Mouth Once A Day 2)  Xanax 0.25 Mg Tabs (Alprazolam) .... 1/2 Tab As Needed 3)  Eq Ibuprofen 200 Mg  Caps (Ibuprofen) .... As Needed  Allergies  (verified): No Known Drug Allergies  Past History:  Family History: Last updated: 01/17/2008 Family History of Arthritis Family History Hypertension Family History of Prostate CA 1st degree relative <50 Family History of Stroke F 1st degree relative <60 pancreatic cancer  Social History: Last updated: 11/14/2007 Occupation: Divorced Current Smoker---episodic  Risk Factors: Smoking Status: current (06/30/2009)  Past medical, surgical, family and social histories (including risk factors) reviewed, and no changes noted (except as noted below).  Past Medical History: Reviewed history from 01/17/2008 and no changes required. 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 Denies surgical history  Family History: Reviewed history from 01/17/2008 and no changes required. Family History of Arthritis Family History Hypertension Family History of Prostate CA 1st degree relative <50 Family History of Stroke F 1st degree relative <60 pancreatic cancer  Social History: Reviewed history from 11/14/2007 and no changes required. Occupation: Divorced Current Smoker---episodic  Review of Systems  The patient denies anorexia, fever, weight loss, weight gain, vision loss, decreased hearing, hoarseness, chest pain, syncope, dyspnea on exertion, peripheral edema, prolonged cough, headaches, hemoptysis, abdominal pain, melena, hematochezia, severe indigestion/heartburn, hematuria, incontinence, genital sores, muscle weakness, suspicious skin lesions, transient blindness, difficulty walking, depression, unusual weight change,  abnormal bleeding, enlarged lymph nodes, angioedema, breast masses, and testicular masses.    Physical Exam  General:  Well-developed,well-nourished,in no acute distress; alert,appropriate and cooperative throughout examination Head:  normocephalic and no  abnormalities observed.   Ears:  R ear normal and L ear normal.   Nose:  nasal dischargemucosal pallor, mucosal erythema, mucosal edema, and airflow obstruction.   Mouth:  Oral mucosa and oropharynx without lesions or exudates.  Teeth in good repair. Neck:  No deformities, masses, or tenderness noted. Lungs:  normal respiratory effort and no wheezes.   Heart:  normal rate and regular rhythm.   Abdomen:  soft and non-tender.   Skin:  plantar ward 2cm on great toe Cervical Nodes:  No lymphadenopathy noted Axillary Nodes:  No palpable lymphadenopathy   Impression & Recommendations:  Problem # 1:  HEMANGIOMA OF SKIN AND SUBCUTANEOUS TISSUE (ICD-228.01) hemagioma of skin referral to derm for laser therapy  Problem # 2:  PLANTAR WART, RIGHT (ICD-078.12)  I have spent grethe pt gave informed consent at 80% acetic acid was applied to the wart. after care discussed and follow up visit made  Orders: Wart Destruct <14 (17110)  Problem # 3:  ALLERGIC RHINITIS CAUSE UNSPECIFIED (ICD-477.9)  Discussed use of allergy medications and environmental measures.   Complete Medication List: 1)  Wellbutrin Sr 150 Mg Tb12 (Bupropion hcl) .... Take 2 tablet by mouth once a day 2)  Xanax 0.25 Mg Tabs (Alprazolam) .... 1/2 tab as needed 3)  Eq Ibuprofen 200 Mg Caps (Ibuprofen) .... As needed  Patient Instructions: 1)  two small hemagioma on tip of nose consider laser therapy 2)  CPX in July 3)  follow up in 30 days for wart check

## 2010-07-13 NOTE — Assessment & Plan Note (Signed)
Summary: 30 days wart check/njr   Vital Signs:  Patient profile:   45 year old male Height:      68 inches Weight:      161 pounds BMI:     24.57 Temp:     98.2 degrees F oral Pulse rate:   72 / minute Resp:     14 per minute BP sitting:   136 / 80  (left arm)  Vitals Entered By: Willy Eddy, LPN (August 03, 2009 9:40 AM) CC: f/u on wart on toe-  healed from last cryo tx   CC:  f/u on wart on toe-  healed from last cryo tx.  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) 2)  Ganglion Cyst, Wrist, Left  (ICD-727.41) 3)  Allergic Rhinitis Cause Unspecified  (ICD-477.9) 4)  Plantar Wart, Right  (ICD-078.12) 5)  Hyperlipidemia, Borderline  (ICD-272.4) 6)  Physical Examination  (ICD-V70.0) 7)  Uri  (ICD-465.9) 8)  Sebaceous Cyst, Scalp  (ICD-706.2) 9)  Benign Prostatic Hypertrophy, With Urinary Obstruction  (ICD-600.01) 10)  Hepatitis B, Hx of  (ICD-V12.09) 11)  Depression  (ICD-311) 12)  Asthma  (ICD-493.90)  Current Problems (verified): 1)  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 Take 2 Tablet By Mouth Once A Day 2)  Xanax 0.25 Mg Tabs (Alprazolam) .... 1/2 Tab As Needed 3)  Eq Ibuprofen 200 Mg   Caps (Ibuprofen) .... As Needed  Current Medications (verified): 1)  Wellbutrin Sr 150 Mg Tb12 (Bupropion Hcl) .... Take 2 Tablet By Mouth Once A Day 2)  Xanax 0.25 Mg Tabs (Alprazolam) .... 1/2 Tab As Needed 3)  Eq Ibuprofen 200 Mg  Caps (Ibuprofen) .... As Needed 4)  Aldara 5 % Crea (Imiquimod) .... To Research  Allergies (verified): No Known Drug Allergies  Past History:  Family History: Last updated: 01/17/2008 Family History of Arthritis Family History Hypertension Family History of Prostate CA 1st degree relative <50 Family History of Stroke F 1st degree relative <60 pancreatic cancer  Social History: Last updated: 11/14/2007 Occupation: Divorced Current Smoker---episodic  Risk Factors: Smoking Status: current (08/03/2009)  Past medical, surgical, family and social histories (including risk factors) reviewed, and no changes noted (except as noted below).  Past Medical History: Reviewed history from 01/17/2008 and no changes required. 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 Denies surgical history  Family History: Reviewed history from 01/17/2008 and no changes required. Family History of Arthritis Family History Hypertension Family History of Prostate CA 1st degree relative <50 Family History of Stroke F 1st degree relative <60 pancreatic cancer  Social History: Reviewed history from 11/14/2007 and no changes required. Occupation: Divorced Current Smoker---episodic  Review  of Systems  The patient denies anorexia, fever, weight loss, weight gain, vision loss, decreased hearing, hoarseness, chest pain, syncope, dyspnea on exertion, peripheral edema, prolonged cough, headaches, hemoptysis, abdominal pain, melena, hematochezia, severe indigestion/heartburn, hematuria, incontinence, genital sores, muscle weakness, suspicious skin lesions,  transient blindness, difficulty walking, depression, unusual weight change, abnormal bleeding, enlarged lymph nodes, angioedema, and breast masses.    Physical Exam  General:  Well-developed,well-nourished,in no acute distress; alert,appropriate and cooperative throughout examination Head:  normocephalic and no abnormalities observed.   Ears:  R ear normal and no external deformities.   Nose:  no external deformity and no nasal discharge.   Neck:  No deformities, masses, or tenderness noted. Lungs:  normal respiratory effort and no wheezes.   Heart:  normal rate and regular rhythm.     Impression & Recommendations:  Problem # 1:  PLANTAR WART, RIGHT (ICD-078.12) the pt was had a goofd reposnse but the site ids too tender to refreeze today has a discussion of aldara as added therapy  Complete Medication List: 1)  Wellbutrin Sr 150 Mg Tb12 (Bupropion hcl) .... Take 2 tablet by mouth once a day 2)  Xanax 0.25 Mg Tabs (Alprazolam) .... 1/2 tab as needed 3)  Eq Ibuprofen 200 Mg Caps (Ibuprofen) .... As needed 4)  Aldara 5 % Crea (Imiquimod) .... To research  Patient Instructions: 1)  Please schedule a follow-up appointment in 2 weeks.

## 2010-07-13 NOTE — Assessment & Plan Note (Signed)
Summary: ONE MTH ROV // RS   Vital Signs:  Patient profile:   45 year old male Height:      68 inches Weight:      161 pounds BMI:     24.57 Temp:     98.2 degrees F rectal Pulse rate:   72 / minute BP sitting:   130 / 80  (left arm)  Vitals Entered By: Willy Eddy, LPN (January 01, 2010 11:40 AM) CC: check feet   CC:  check feet.  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 Take 2 Tablet By Mouth Once A Day 2)  Xanax 0.25 Mg Tabs (Alprazolam) .... 1/2 Tab As Needed 3)  Eq Ibuprofen 200 Mg  Caps (Ibuprofen) .... As Needed 4)  Aldara 5 % Crea (Imiquimod) .... To Research 5)  Xyzal 5 Mg Tabs (Levocetirizine Dihydrochloride) .... One  By Mouth Two Times A Day As Needed Sever Allergies ( Please Give Generic Drug) 6)  Lotrisone 1-0.05 % Lotn (Clotrimazole-Betamethasone) .... Generic Apply To Foot Bid  Allergies (verified): No Known Drug Allergies   Impression & Recommendations:  Problem # 1:  PLANTAR WART, RIGHT (ICD-078.12)  the lesion was identifies as a   plantar wart       and 40 seconds of cryotherapy with the liguid nitrogen gun was apllied to the site. The pt tolerated the procedure and post procedure  care was discussed  Orders: No Charge Patient Arrived (NCPA0) (NCPA0) Cryotherapy/Destruction benign or premalignant lesion (1st lesion)  (17000)  Complete Medication List: 1)  Wellbutrin Sr 150 Mg Tb12 (Bupropion hcl) .... Take 2 tablet by mouth once a day 2)  Xanax 0.25 Mg Tabs (Alprazolam) .... 1/2 tab as needed 3)  Eq Ibuprofen 200 Mg Caps (Ibuprofen) .... As needed 4)  Aldara 5 % Crea (Imiquimod) .... To research 5)  Xyzal 5 Mg Tabs (Levocetirizine dihydrochloride) .... One  by mouth two times a day as needed sever allergies ( please give generic drug) 6)  Lotrisone 1-0.05 % Lotn (Clotrimazole-betamethasone) .... Generic apply to foot bid

## 2010-07-13 NOTE — Assessment & Plan Note (Signed)
Summary: flu shot/jls  Nurse Visit    Prior Medications: WELLBUTRIN SR 150 MG TB12 (BUPROPION HCL) Take 2 tablet by mouth once a day XANAX 0.25 MG TABS (ALPRAZOLAM) 1/2 tab as needed AVODART 0.5 MG  CAPS (DUTASTERIDE) one by mouth daily EQ IBUPROFEN 200 MG  CAPS (IBUPROFEN) as needed Current Allergies: No known allergies     Orders Added: 1)  Admin 1st Vaccine [90471] 2)  Flu Vaccine 43yrs + [62831]  Flu Vaccine Consent Questions     Do you have a history of severe allergic reactions to this vaccine? no    Any prior history of allergic reactions to egg and/or gelatin? no    Do you have a sensitivity to the preservative Thimersol? no    Do you have a past history of Guillan-Barre Syndrome? no    Do you currently have an acute febrile illness? no    Have you ever had a severe reaction to latex? no    Vaccine information given and explained to patient? yes    Are you currently pregnant? no    Lot Number:[id number]   Site Given  Left Deltoid [email or web address]

## 2010-07-13 NOTE — Assessment & Plan Note (Signed)
Summary: 4 month rov/mm   Vital Signs:  Patient profile:   45 year old male Height:      68 inches Weight:      152 pounds BMI:     23.20 Temp:     97.4 degrees F oral Pulse rate:   72 / minute Resp:     14 per minute BP sitting:   120 / 80  (left arm)  Vitals Entered By: Willy Eddy, LPN (August 20, 2008 8:00 AM) CC: roa labs Is Patient Diabetic? No Pain Assessment Patient in pain? no        CC:  roa labs.  History of Present Illness: the pt did not start fish pil and need to begin  Hyperlipidemia Follow-Up      This is a 45 year old man who presents for Hyperlipidemia follow-up.  The patient denies muscle aches, GI upset, abdominal pain, flushing, itching, constipation, diarrhea, and fatigue.  The patient denies the following symptoms: chest pain/pressure, exercise intolerance, dypsnea, palpitations, syncope, and pedal edema.  Dietary compliance has been good.  The patient reports exercising 3-4X per week.  Adjunctive measures currently used by the patient include fish oil supplements.    Problems Prior to Update: 1)  Hyperlipidemia, Borderline  (ICD-272.4) 2)  Physical Examination  (ICD-V70.0) 3)  Uri  (ICD-465.9) 4)  Sebaceous Cyst, Scalp  (ICD-706.2) 5)  Benign Prostatic Hypertrophy, With Urinary Obstruction  (ICD-600.01) 6)  Hepatitis B, Hx of  (ICD-V12.09) 7)  Depression  (ICD-311) 8)  Asthma  (ICD-493.90)  Medications Prior to Update: 1)  Wellbutrin Sr 150 Mg Tb12 (Bupropion Hcl) .... Take 2 Tablet By Mouth Once A Day 2)  Xanax 0.25 Mg Tabs (Alprazolam) .... 1/2 Tab As Needed 3)  Avodart 0.5 Mg  Caps (Dutasteride) .... One By Mouth Daily 4)  Eq Ibuprofen 200 Mg  Caps (Ibuprofen) .... 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  (01/17/2008)  Past Surgical History: Denies surgical history  (02/05/2007)  Family History: Family  History of Arthritis Family History Hypertension Family History of Prostate CA 1st degree relative <50 Family History of Stroke F 1st degree relative <60 pancreatic cancer  (01/17/2008)  Social History: Occupation: Divorced Current Smoker---episodic  (11/14/2007)  Risk Factors: Alcohol Use: N/A >5 drinks/d w/in last 3 months: N/A Caffeine Use: N/A Diet: N/A Exercise: N/A  Risk Factors: Smoking Status: current (02/05/2007) Packs/Day: N/A Cigars/wk: N/A Pipe Use/wk: N/A Cans of tobacco/wk: N/A Passive Smoke Exposure: N/A  Review of Systems  The patient denies anorexia, fever, weight loss, weight gain, vision loss, decreased hearing, hoarseness, chest pain, syncope, dyspnea on exertion, peripheral edema, prolonged cough, headaches, hemoptysis, abdominal pain, melena, hematochezia, severe indigestion/heartburn, hematuria, incontinence, genital sores, muscle weakness, suspicious skin lesions, transient blindness, difficulty walking, depression, unusual weight change, abnormal bleeding, enlarged lymph nodes, angioedema, and breast masses.    Physical Exam  General:  Well-developed,well-nourished,in no acute distress; alert,appropriate and cooperative throughout examination Head:  normocephalic and no abnormalities observed.   Eyes:  No corneal or conjunctival inflammation noted. EOMI. Perrla. Funduscopic exam benign, without hemorrhages, exudates or papilledema. Vision grossly normal. Ears:  External ear exam shows no significant lesions or deformities.  Otoscopic examination reveals clear canals, tympanic membranes are intact bilaterally without bulging, retraction, inflammation or discharge. Hearing is grossly normal bilaterally. Mouth:  Oral mucosa and oropharynx without lesions or exudates.  Teeth in  good repair. Neck:  No deformities, masses, or tenderness noted. Heart:  Normal rate and regular rhythm. S1 and S2 normal without gallop, murmur, click, rub or other extra  sounds. Abdomen:  Bowel sounds positive,abdomen soft and non-tender without masses, organomegaly or hernias noted.   Impression & Recommendations:  Problem # 1:  HYPERLIPIDEMIA, BORDERLINE (ICD-272.4) discussion the role of fish oil diet control  Labs Reviewed: Chol: 164 (08/13/2008)   HDL: 41.2 (08/13/2008)   LDL: 112 (08/13/2008)   TG: 56 (08/13/2008) SGOT: 23 (08/13/2008)   SGPT: 36 (08/13/2008)  Problem # 2:  BENIGN PROSTATIC HYPERTROPHY, WITH URINARY OBSTRUCTION (ICD-600.01)  His updated medication list for this problem includes:    Avodart 0.5 Mg Caps (Dutasteride) ..... One by mouth daily  Problem # 3:  ASTHMA (ICD-493.90) sample of rescue inhaler rare use stage 1  Complete Medication List: 1)  Wellbutrin Sr 150 Mg Tb12 (Bupropion hcl) .... Take 2 tablet by mouth once a day 2)  Xanax 0.25 Mg Tabs (Alprazolam) .... 1/2 tab as needed 3)  Avodart 0.5 Mg Caps (Dutasteride) .... One by mouth daily 4)  Eq Ibuprofen 200 Mg Caps (Ibuprofen) .... As needed  Patient Instructions: 1)  Pt 's son may be scheduled for a camp physical and to establish ( no required labs) 2)  july for cpx with labs prior 3)  The medication list was reviewed and reconciled.  All changed / newly prescribed medications were explained.  A complete medication list was provided to the patient / caregiver.

## 2010-07-13 NOTE — Assessment & Plan Note (Signed)
Summary: 2 week fup//ccm   Vital Signs:  Patient profile:   45 year old male Height:      68 inches Weight:      161 pounds BMI:     24.57 Temp:     98.2 degrees F oral Pulse rate:   72 / minute Resp:     14 per minute BP sitting:   136 / 80  (left arm)  Vitals Entered By: Willy Eddy, LPN (August 18, 2954 11:53 AM) CC: roa- cryo foot   CC:  roa- cryo foot.  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) 2)  Ganglion Cyst, Wrist, Left  (ICD-727.41) 3)  Allergic Rhinitis Cause Unspecified  (ICD-477.9) 4)  Plantar Wart, Right  (ICD-078.12) 5)  Hyperlipidemia, Borderline  (ICD-272.4) 6)  Physical Examination  (ICD-V70.0) 7)  Uri  (ICD-465.9) 8)  Sebaceous Cyst, Scalp  (ICD-706.2) 9)  Benign Prostatic Hypertrophy, With Urinary Obstruction  (ICD-600.01) 10)  Hepatitis B, Hx of  (ICD-V12.09) 11)  Depression  (ICD-311) 12)  Asthma  (ICD-493.90)  Current Problems (verified): 1)  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 Take 2 Tablet By Mouth Once A Day 2)  Xanax 0.25 Mg Tabs (Alprazolam) .... 1/2 Tab As Needed 3)  Eq Ibuprofen 200 Mg  Caps (Ibuprofen) .... As Needed 4)   Aldara 5 % Crea (Imiquimod) .... To Research  Current Medications (verified): 1)  Wellbutrin Sr 150 Mg Tb12 (Bupropion Hcl) .... Take 2 Tablet By Mouth Once A Day 2)  Xanax 0.25 Mg Tabs (Alprazolam) .... 1/2 Tab As Needed 3)  Eq Ibuprofen 200 Mg  Caps (Ibuprofen) .... As Needed 4)  Aldara 5 % Crea (Imiquimod) .... To Research 5)  Xyzal 5 Mg Tabs (Levocetirizine Dihydrochloride) .... One  By Mouth Two Times A Day As Needed Sever Allergies ( Please Give Generic Drug)  Allergies (verified): No Known Drug Allergies  Past History:  Family History: Last updated: 01/17/2008 Family History of Arthritis Family History Hypertension Family History of Prostate CA 1st degree relative <50 Family History of Stroke F 1st degree relative <60 pancreatic cancer  Social History: Last updated: 11/14/2007 Occupation: Divorced Current Smoker---episodic  Risk Factors: Smoking Status: current (08/17/2009)  Past medical, surgical, family and social histories (including risk factors) reviewed, and no changes noted (except as noted below).  Past Medical History: Reviewed history from 01/17/2008 and no changes required. 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 Denies surgical history  Family History: Reviewed history from 01/17/2008 and no changes required. Family History of Arthritis Family History Hypertension Family History of Prostate CA 1st degree relative <50 Family History  of Stroke F 1st degree relative <60 pancreatic cancer  Social History: Reviewed history from 11/14/2007 and no changes required. Occupation: Divorced Current Smoker---episodic  Review of Systems       The patient complains of hoarseness.  The patient denies anorexia, fever, weight loss, weight gain, vision loss, decreased hearing, chest pain, syncope, dyspnea on exertion, peripheral edema,  prolonged cough, headaches, hemoptysis, abdominal pain, melena, hematochezia, severe indigestion/heartburn, hematuria, incontinence, genital sores, muscle weakness, suspicious skin lesions, transient blindness, difficulty walking, depression, unusual weight change, abnormal bleeding, enlarged lymph nodes, angioedema, breast masses, and testicular masses.         rhinitis  Physical Exam  General:  Well-developed,well-nourished,in no acute distress; alert,appropriate and cooperative throughout examination Eyes:  pupils equal and pupils round.   Ears:  R ear normal and no external deformities.   Nose:  nasal dischargemucosal pallor.   Mouth:  pharynx pink and moist and postnasal drip.   Neck:  No deformities, masses, or tenderness noted. Lungs:  normal respiratory effort and no wheezes.   Heart:  normal rate and regular rhythm.   Abdomen:  soft and non-tender.   Msk:  No deformity or scoliosis noted of thoracic or lumbar spine.   Pulses:  R and L carotid,radial,femoral,dorsalis pedis and posterior tibial pulses are full and equal bilaterally Extremities:  No clubbing, cyanosis, edema, or deformity noted with normal full range of motion of all joints.   Neurologic:  alert & oriented X3 and cranial nerves II-XII intact.     Impression & Recommendations:  Problem # 1:  PLANTAR WART, RIGHT (ICD-078.12) the lesion was identifies as a  planar wart        and 40 seconds of cryotherapy with the liguid nitrogen gun was apllied to the site. The pt tolerated the procedure and post procedure care was discussed  Orders: Cryotherapy/Destruction benign or premalignant lesion (1st lesion)  (17000)  Problem # 2:  ALLERGIC RHINITIS CAUSE UNSPECIFIED (ICD-477.9) discussion of failure of allegra His updated medication list for this problem includes:    Xyzal 5 Mg Tabs (Levocetirizine dihydrochloride) ..... One  by mouth two times a day as needed sever allergies ( please give generic drug)  Discussed use  of allergy medications and environmental measures.   Complete Medication List: 1)  Wellbutrin Sr 150 Mg Tb12 (Bupropion hcl) .... Take 2 tablet by mouth once a day 2)  Xanax 0.25 Mg Tabs (Alprazolam) .... 1/2 tab as needed 3)  Eq Ibuprofen 200 Mg Caps (Ibuprofen) .... As needed 4)  Aldara 5 % Crea (Imiquimod) .... To research 5)  Xyzal 5 Mg Tabs (Levocetirizine dihydrochloride) .... One  by mouth two times a day as needed sever allergies ( please give generic drug)  Patient Instructions: 1)  Please schedule a follow-up appointment in 3weeks.  add the cpx labs HIV  screening, PSA family hx of prostate cancer Prescriptions: XYZAL 5 MG TABS (LEVOCETIRIZINE DIHYDROCHLORIDE) one  by mouth two times a day as needed sever allergies ( please give generic drug)  #60 x 3   Entered and Authorized by:   Stacie Glaze MD   Signed by:   Stacie Glaze MD on 08/17/2009   Method used:   Electronically to        [facility] [address] [phone]* (retail)       [address]       [place], [place]  [address]  Ph: [phone] or [phone]       Fax: [phone]   RxID:   [id number]

## 2010-07-13 NOTE — Progress Notes (Signed)
Summary: please return call  Phone Note Call from Patient Call back at [phone]   Caller: Patient--live call Summary of Call: need the name of the program for his son. Wants Dr Lovell Sheehan to return call. Initial call taken by: Warnell Forester,  December 25, 2009 1:35 PM  Follow-up for Phone Call        [facility] [place]. number given  [phone] Follow-up by: Willy Eddy, LPN,  December 28, 2009 10:09 AM

## 2010-07-20 ENCOUNTER — Telehealth: Payer: Self-pay | Admitting: Internal Medicine

## 2010-07-20 NOTE — Telephone Encounter (Signed)
Pt wants HIV and PSA added to his cpx labs in August.... Ok to order?

## 2010-07-20 NOTE — Telephone Encounter (Signed)
Ok to order 

## 2010-07-26 ENCOUNTER — Ambulatory Visit: Payer: Self-pay | Admitting: Internal Medicine

## 2010-08-14 ENCOUNTER — Other Ambulatory Visit: Payer: Self-pay | Admitting: Internal Medicine

## 2010-10-04 ENCOUNTER — Ambulatory Visit (INDEPENDENT_AMBULATORY_CARE_PROVIDER_SITE_OTHER): Payer: Commercial Managed Care - PPO | Admitting: Internal Medicine

## 2010-10-04 DIAGNOSIS — B070 Plantar wart: Secondary | ICD-10-CM

## 2010-10-06 ENCOUNTER — Encounter: Payer: Self-pay | Admitting: Internal Medicine

## 2010-10-06 NOTE — Progress Notes (Signed)
  Subjective:    Patient ID: Caleb Obrien, male    DOB: [birth year], 45 y.o.   MRN: [id number]  HPI The patient is a 45 year old white male who has a history of plantar warts that have been successfully treated in the past there is a new plantar wart in a different location on his great toe on his right foot.  He denies any warts in any other locations he has no fever chills nausea vomiting no immunosuppression that would put him at risk for pelvic excessive warts   Review of Systems  Constitutional: Negative for fever and fatigue.  HENT: Negative for hearing loss, congestion, neck pain and postnasal drip.   Eyes: Negative for discharge, redness and visual disturbance.  Respiratory: Negative for cough, shortness of breath and wheezing.   Cardiovascular: Negative for leg swelling.  Gastrointestinal: Negative for abdominal pain, constipation and abdominal distention.  Genitourinary: Negative for urgency and frequency.  Musculoskeletal: Negative for joint swelling and arthralgias.  Skin: Negative for color change and rash.  Neurological: Negative for weakness and light-headedness.  Hematological: Negative for adenopathy.  Psychiatric/Behavioral: Negative for behavioral problems.       Past Medical History  Diagnosis Date  . PLANTAR WART, RIGHT 01/23/2009  . Dermatophytosis of foot 11/02/2009  . Hemangioma of skin and subcutaneous tissue 06/30/2009  . HYPERLIPIDEMIA, BORDERLINE 04/21/2008  . ALLERGIC RHINITIS CAUSE UNSPECIFIED 02/24/2009  . ASTHMA 02/05/2007  . BENIGN PROSTATIC HYPERTROPHY, WITH URINARY OBSTRUCTION 05/22/2007  . GANGLION CYST, WRIST, LEFT 03/25/2009  . HEPATITIS B, HX OF 02/05/2007   No past surgical history on file.  reports that he has been smoking.  He does not have any smokeless tobacco history on file. His alcohol and drug histories not on file. family history includes Arthritis in an unspecified family member; Cancer in an unspecified family member; Hypertension  in an unspecified family member; and Stroke in an unspecified family member. Not on File  Objective:   Physical Exam  Constitutional: He appears well-developed and well-nourished.  HENT:  Head: Normocephalic and atraumatic.  Eyes: Conjunctivae are normal. Pupils are equal, round, and reactive to light.  Neck: Normal range of motion. Neck supple.  Cardiovascular: Normal rate and regular rhythm.   Pulmonary/Chest: Effort normal and breath sounds normal.  Abdominal: Soft. Bowel sounds are normal.  Skin:       Elevation of the right great toe reveals a 0.3 cm plantar wart at the base of the right          Assessment & Plan:  The patient had a thorough examination of his feet only one plantar wart was detected in form consent was obtained and cryotherapy was applied for 60 seconds to the wart post treatment therapy was discussed with the patient as well as what to expect from the cryotherapy he will present in 4 weeks for reexamination of the side patient expressed understanding of these instructions

## 2010-11-01 ENCOUNTER — Encounter: Payer: Self-pay | Admitting: Internal Medicine

## 2010-11-01 ENCOUNTER — Ambulatory Visit (INDEPENDENT_AMBULATORY_CARE_PROVIDER_SITE_OTHER): Payer: Commercial Managed Care - PPO | Admitting: Internal Medicine

## 2010-11-01 DIAGNOSIS — B070 Plantar wart: Secondary | ICD-10-CM

## 2010-11-01 DIAGNOSIS — Z0000 Encounter for general adult medical examination without abnormal findings: Secondary | ICD-10-CM

## 2010-11-01 NOTE — Progress Notes (Signed)
  Subjective:    Patient ID: Caleb Obrien, male    DOB: 1965/07/26, 45 y.o.   MRN: [id number]  HPI  Here for treatment of plantar warts  Review of Systems     Objective:   Physical Exam        Assessment & Plan:   Informed consent was obtained in the lesion was treated for 60 seconds of liquid nitrogen application the patient tolerated the procedure well as procedural care was discussed with the patient and instructions should the lesion reappears contact our office immediately

## 2010-11-02 ENCOUNTER — Other Ambulatory Visit: Payer: Self-pay | Admitting: Internal Medicine

## 2010-12-02 ENCOUNTER — Ambulatory Visit (INDEPENDENT_AMBULATORY_CARE_PROVIDER_SITE_OTHER): Payer: Commercial Managed Care - PPO | Admitting: Internal Medicine

## 2010-12-02 VITALS — BP 110/70 | HR 72 | Temp 98.2°F | Resp 14 | Ht 68.0 in | Wt 155.0 lb

## 2010-12-02 DIAGNOSIS — F39 Unspecified mood [affective] disorder: Secondary | ICD-10-CM

## 2010-12-22 ENCOUNTER — Ambulatory Visit (INDEPENDENT_AMBULATORY_CARE_PROVIDER_SITE_OTHER): Payer: Commercial Managed Care - PPO | Admitting: Licensed Clinical Social Worker

## 2010-12-22 DIAGNOSIS — F4322 Adjustment disorder with anxiety: Secondary | ICD-10-CM

## 2010-12-27 ENCOUNTER — Ambulatory Visit (INDEPENDENT_AMBULATORY_CARE_PROVIDER_SITE_OTHER): Payer: Commercial Managed Care - PPO | Admitting: Licensed Clinical Social Worker

## 2010-12-27 DIAGNOSIS — F4322 Adjustment disorder with anxiety: Secondary | ICD-10-CM

## 2011-01-05 ENCOUNTER — Ambulatory Visit (INDEPENDENT_AMBULATORY_CARE_PROVIDER_SITE_OTHER): Payer: Commercial Managed Care - PPO | Admitting: Licensed Clinical Social Worker

## 2011-01-05 DIAGNOSIS — F4322 Adjustment disorder with anxiety: Secondary | ICD-10-CM

## 2011-01-19 ENCOUNTER — Ambulatory Visit (INDEPENDENT_AMBULATORY_CARE_PROVIDER_SITE_OTHER): Payer: Commercial Managed Care - PPO | Admitting: Licensed Clinical Social Worker

## 2011-01-19 DIAGNOSIS — F4322 Adjustment disorder with anxiety: Secondary | ICD-10-CM

## 2011-01-28 ENCOUNTER — Other Ambulatory Visit (INDEPENDENT_AMBULATORY_CARE_PROVIDER_SITE_OTHER): Payer: Commercial Managed Care - PPO

## 2011-01-28 DIAGNOSIS — Z0000 Encounter for general adult medical examination without abnormal findings: Secondary | ICD-10-CM

## 2011-01-28 LAB — CBC WITH DIFFERENTIAL/PLATELET
Basophils Absolute: 0 10*3/uL (ref 0.0–0.1)
Basophils Relative: 0.6 % (ref 0.0–3.0)
Eosinophils Absolute: 0.1 10*3/uL (ref 0.0–0.7)
Eosinophils Relative: 1 % (ref 0.0–5.0)
HCT: 44.6 % (ref 39.0–52.0)
Hemoglobin: 14.9 g/dL (ref 13.0–17.0)
Lymphocytes Relative: 25.9 % (ref 12.0–46.0)
Lymphs Abs: 1.8 10*3/uL (ref 0.7–4.0)
MCHC: 33.5 g/dL (ref 30.0–36.0)
MCV: 101 fl — ABNORMAL HIGH (ref 78.0–100.0)
Monocytes Absolute: 0.9 10*3/uL (ref 0.1–1.0)
Monocytes Relative: 13.5 % — ABNORMAL HIGH (ref 3.0–12.0)
Neutro Abs: 4.1 10*3/uL (ref 1.4–7.7)
Neutrophils Relative %: 59 % (ref 43.0–77.0)
Platelets: 268 10*3/uL (ref 150.0–400.0)
RBC: 4.42 Mil/uL (ref 4.22–5.81)
RDW: 12.9 % (ref 11.5–14.6)
WBC: 7 10*3/uL (ref 4.5–10.5)

## 2011-01-28 LAB — LIPID PANEL
Cholesterol: 178 mg/dL (ref 0–200)
HDL: 57.5 mg/dL (ref >39.00)
LDL Cholesterol: 107 mg/dL — ABNORMAL HIGH (ref 0–99)
Total CHOL/HDL Ratio: 3
Triglycerides: 68 mg/dL (ref 0.0–149.0)
VLDL: 13.6 mg/dL (ref 0.0–40.0)

## 2011-01-28 LAB — HEPATIC FUNCTION PANEL
ALT: 27 U/L (ref 0–53)
AST: 21 U/L (ref 0–37)
Albumin: 4.2 g/dL (ref 3.5–5.2)
Alkaline Phosphatase: 60 U/L (ref 39–117)
Bilirubin, Direct: 0.1 mg/dL (ref 0.0–0.3)
Total Bilirubin: 0.7 mg/dL (ref 0.3–1.2)
Total Protein: 6.5 g/dL (ref 6.0–8.3)

## 2011-01-28 LAB — PSA: PSA: 0.75 ng/mL (ref 0.10–4.00)

## 2011-01-28 LAB — POCT URINALYSIS DIPSTICK
Bilirubin, UA: NEGATIVE
Blood, UA: NEGATIVE
Glucose, UA: NEGATIVE
Ketones, UA: NEGATIVE
Leukocytes, UA: NEGATIVE
Nitrite, UA: NEGATIVE
Protein, UA: NEGATIVE
Spec Grav, UA: 1.02
Urobilinogen, UA: 0.2
pH, UA: 7

## 2011-01-28 LAB — BASIC METABOLIC PANEL
BUN: 14 mg/dL (ref 6–23)
CO2: 30 mEq/L (ref 19–32)
Calcium: 9.2 mg/dL (ref 8.4–10.5)
Chloride: 105 mEq/L (ref 96–112)
Creatinine, Ser: 1 mg/dL (ref 0.4–1.5)
GFR: 84.56 mL/min (ref >60.00)
Glucose, Bld: 94 mg/dL (ref 70–99)
Potassium: 4.4 mEq/L (ref 3.5–5.1)
Sodium: 142 mEq/L (ref 135–145)

## 2011-01-28 LAB — TSH: TSH: 2.83 u[IU]/mL (ref 0.35–5.50)

## 2011-01-29 LAB — HIV ANTIBODY (ROUTINE TESTING W REFLEX): HIV: NONREACTIVE

## 2011-02-01 ENCOUNTER — Ambulatory Visit (INDEPENDENT_AMBULATORY_CARE_PROVIDER_SITE_OTHER): Payer: Commercial Managed Care - PPO | Admitting: Licensed Clinical Social Worker

## 2011-02-01 DIAGNOSIS — F4322 Adjustment disorder with anxiety: Secondary | ICD-10-CM

## 2011-02-04 ENCOUNTER — Ambulatory Visit: Payer: Self-pay | Admitting: Internal Medicine

## 2011-02-08 ENCOUNTER — Ambulatory Visit (INDEPENDENT_AMBULATORY_CARE_PROVIDER_SITE_OTHER): Payer: Commercial Managed Care - PPO | Admitting: Internal Medicine

## 2011-02-08 ENCOUNTER — Encounter: Payer: Self-pay | Admitting: Internal Medicine

## 2011-02-08 DIAGNOSIS — Z23 Encounter for immunization: Secondary | ICD-10-CM

## 2011-02-08 DIAGNOSIS — F17200 Nicotine dependence, unspecified, uncomplicated: Secondary | ICD-10-CM

## 2011-02-08 DIAGNOSIS — Z0000 Encounter for general adult medical examination without abnormal findings: Secondary | ICD-10-CM

## 2011-02-09 NOTE — Progress Notes (Signed)
  Subjective:    Patient ID: Caleb Obrien, male    DOB: [birth year], 45 y.o.   MRN: [id number]  HPI  Patient is a 45 year old white male presents for complete physical examination.  He is generally doing well the plantar wart on his right foot and we have been treating appears to be in remission.  He has questions about cardiovascular risk factors for early heart disease.  We counseled him for 10 minutes about smoking cessation otherwise blood pressure lipids exercise and weight appears to be in his favor there is no strong family history of early heart disease  Review of Systems  Constitutional: Negative for fever and fatigue.  HENT: Negative for hearing loss, congestion, neck pain and postnasal drip.   Eyes: Negative for discharge, redness and visual disturbance.  Respiratory: Negative for cough, shortness of breath and wheezing.   Cardiovascular: Negative for leg swelling.  Gastrointestinal: Negative for abdominal pain, constipation and abdominal distention.  Genitourinary: Negative for urgency and frequency.  Musculoskeletal: Negative for joint swelling and arthralgias.  Skin: Negative for color change and rash.  Neurological: Negative for weakness and light-headedness.  Hematological: Negative for adenopathy.  Psychiatric/Behavioral: Negative for behavioral problems.   Past Medical History  Diagnosis Date  . PLANTAR WART, RIGHT 01/23/2009  . Dermatophytosis of foot 11/02/2009  . Hemangioma of skin and subcutaneous tissue 06/30/2009  . HYPERLIPIDEMIA, BORDERLINE 04/21/2008  . ALLERGIC RHINITIS CAUSE UNSPECIFIED 02/24/2009  . ASTHMA 02/05/2007  . BENIGN PROSTATIC HYPERTROPHY, WITH URINARY OBSTRUCTION 05/22/2007  . GANGLION CYST, WRIST, LEFT 03/25/2009  . HEPATITIS B, HX OF 02/05/2007   History reviewed. No pertinent past surgical history.  reports that he has been smoking.  He does not have any smokeless tobacco history on file. He reports that he drinks alcohol. He reports that  he does not use illicit drugs. family history includes Arthritis in his father; Hypertension in his mother; and Stroke in his mother. No Known Allergies     Objective:   Physical Exam  Constitutional: He appears well-developed and well-nourished.  HENT:  Head: Normocephalic and atraumatic.  Eyes: Conjunctivae are normal. Pupils are equal, round, and reactive to light.  Neck: Normal range of motion. Neck supple.  Cardiovascular: Normal rate and regular rhythm.   Pulmonary/Chest: Effort normal and breath sounds normal.  Abdominal: Soft. Bowel sounds are normal.          Assessment & Plan:   Patient presents for yearly preventative medicine examination.   all immunizations and health maintenance protocols were reviewed with the patient and they are up to date with these protocols.   screening laboratory values were reviewed with the patient including screening of hyperlipidemia PSA renal function and hepatic function.   There medications past medical history social history problem list and allergies were reviewed in detail.   Goals were established with regard to weight loss exercise diet in compliance with medications  We spent more than 10 minutes face-to-face counseling the patient about smoking cessation and reduction of cardiovascular risk factors reviewed the patient's labs and reassured him that his other cardiovascular risk factors appear to be minimal at this time primary intervention that would be most successful in lowering his risk with smoking cessation

## 2011-02-18 ENCOUNTER — Other Ambulatory Visit: Payer: Self-pay | Admitting: Internal Medicine

## 2011-03-07 ENCOUNTER — Other Ambulatory Visit: Payer: Self-pay | Admitting: Internal Medicine

## 2011-03-08 ENCOUNTER — Other Ambulatory Visit: Payer: Self-pay | Admitting: *Deleted

## 2011-03-08 MED ORDER — ALPRAZOLAM 0.25 MG PO TABS: ORAL_TABLET | ORAL | Status: DC

## 2011-05-24 ENCOUNTER — Ambulatory Visit (INDEPENDENT_AMBULATORY_CARE_PROVIDER_SITE_OTHER): Payer: Commercial Managed Care - PPO | Admitting: Internal Medicine

## 2011-05-24 DIAGNOSIS — Z23 Encounter for immunization: Secondary | ICD-10-CM

## 2011-07-20 NOTE — Progress Notes (Signed)
  Subjective:    Patient ID: Caleb Obrien, male    DOB: 12/17/1965, 46 y.o.   MRN: [id number]  HPI Plantars wart   Review of Systems     Objective:   Physical Exam        Assessment & Plan:   Informed consent was obtained in the lesion was treated for 60 seconds of liquid nitrogen application the patient tolerated the procedure well as procedural care was discussed with the patient and instructions should the lesion reappears contact our office immediately

## 2011-07-27 ENCOUNTER — Other Ambulatory Visit: Payer: Self-pay | Admitting: Internal Medicine

## 2011-09-03 ENCOUNTER — Other Ambulatory Visit: Payer: Self-pay | Admitting: Internal Medicine

## 2011-11-24 ENCOUNTER — Other Ambulatory Visit: Payer: Self-pay | Admitting: Internal Medicine

## 2011-11-24 NOTE — Telephone Encounter (Signed)
Pt is out of generic wellbutrin 150mg  please call [facility] [phone]. Please call pt once med has been sent into pharm.

## 2011-11-25 ENCOUNTER — Other Ambulatory Visit: Payer: Self-pay | Admitting: *Deleted

## 2011-11-25 MED ORDER — BUPROPION HCL ER (SR) 150 MG PO TB12: 150.0000 mg | ORAL_TABLET | Freq: Two times a day (BID) | ORAL | Status: DC

## 2011-11-25 NOTE — Telephone Encounter (Signed)
Please tell pt med sent in

## 2011-11-25 NOTE — Telephone Encounter (Signed)
lmom med has been called into pharm

## 2011-12-28 ENCOUNTER — Telehealth: Payer: Self-pay | Admitting: Internal Medicine

## 2011-12-28 NOTE — Telephone Encounter (Signed)
Thanks nicole

## 2011-12-28 NOTE — Telephone Encounter (Signed)
Pt called to reschedule cpx appt next appt is not until 03/19/12 pt does not want to wait till then states that we rescheduled on him the first time an he does not want to wait for October. I looked in pt history and did not see that the appt date was ever changed in the past. Please advise

## 2012-01-27 ENCOUNTER — Other Ambulatory Visit (INDEPENDENT_AMBULATORY_CARE_PROVIDER_SITE_OTHER): Payer: PRIVATE HEALTH INSURANCE

## 2012-01-27 DIAGNOSIS — Z0000 Encounter for general adult medical examination without abnormal findings: Secondary | ICD-10-CM

## 2012-01-27 LAB — POCT URINALYSIS DIPSTICK
Bilirubin, UA: NEGATIVE
Blood, UA: NEGATIVE
Glucose, UA: NEGATIVE
Ketones, UA: NEGATIVE
Leukocytes, UA: NEGATIVE
Nitrite, UA: NEGATIVE
Protein, UA: NEGATIVE
Spec Grav, UA: >=1.03
Urobilinogen, UA: 0.2
pH, UA: 5.5

## 2012-01-27 LAB — CBC WITH DIFFERENTIAL/PLATELET
Basophils Absolute: 0 10*3/uL (ref 0.0–0.1)
Basophils Relative: 0.5 % (ref 0.0–3.0)
Eosinophils Absolute: 0.1 10*3/uL (ref 0.0–0.7)
Eosinophils Relative: 1.4 % (ref 0.0–5.0)
HCT: 46.1 % (ref 39.0–52.0)
Hemoglobin: 15.4 g/dL (ref 13.0–17.0)
Lymphocytes Relative: 23.3 % (ref 12.0–46.0)
Lymphs Abs: 1.6 10*3/uL (ref 0.7–4.0)
MCHC: 33.5 g/dL (ref 30.0–36.0)
MCV: 99.3 fl (ref 78.0–100.0)
Monocytes Absolute: 1 10*3/uL (ref 0.1–1.0)
Monocytes Relative: 15.4 % — ABNORMAL HIGH (ref 3.0–12.0)
Neutro Abs: 4 10*3/uL (ref 1.4–7.7)
Neutrophils Relative %: 59.4 % (ref 43.0–77.0)
Platelets: 292 10*3/uL (ref 150.0–400.0)
RBC: 4.65 Mil/uL (ref 4.22–5.81)
RDW: 13.1 % (ref 11.5–14.6)
WBC: 6.8 10*3/uL (ref 4.5–10.5)

## 2012-01-27 LAB — HEPATIC FUNCTION PANEL
ALT: 31 U/L (ref 0–53)
AST: 23 U/L (ref 0–37)
Albumin: 4.3 g/dL (ref 3.5–5.2)
Alkaline Phosphatase: 65 U/L (ref 39–117)
Bilirubin, Direct: 0.1 mg/dL (ref 0.0–0.3)
Total Bilirubin: 0.8 mg/dL (ref 0.3–1.2)
Total Protein: 6.7 g/dL (ref 6.0–8.3)

## 2012-01-27 LAB — BASIC METABOLIC PANEL
BUN: 19 mg/dL (ref 6–23)
CO2: 27 mEq/L (ref 19–32)
Calcium: 9.2 mg/dL (ref 8.4–10.5)
Chloride: 104 mEq/L (ref 96–112)
Creatinine, Ser: 1.1 mg/dL (ref 0.4–1.5)
GFR: 74.79 mL/min (ref >60.00)
Glucose, Bld: 75 mg/dL (ref 70–99)
Potassium: 4 mEq/L (ref 3.5–5.1)
Sodium: 139 mEq/L (ref 135–145)

## 2012-01-27 LAB — LIPID PANEL
Cholesterol: 209 mg/dL — ABNORMAL HIGH (ref 0–200)
HDL: 54.9 mg/dL (ref >39.00)
Total CHOL/HDL Ratio: 4
Triglycerides: 78 mg/dL (ref 0.0–149.0)
VLDL: 15.6 mg/dL (ref 0.0–40.0)

## 2012-01-27 LAB — TSH: TSH: 2.9 u[IU]/mL (ref 0.35–5.50)

## 2012-01-27 LAB — PSA: PSA: 0.49 ng/mL (ref 0.10–4.00)

## 2012-01-28 LAB — HIV ANTIBODY (ROUTINE TESTING W REFLEX): HIV: NONREACTIVE

## 2012-01-30 LAB — LDL CHOLESTEROL, DIRECT: Direct LDL: 144.4 mg/dL

## 2012-02-01 ENCOUNTER — Other Ambulatory Visit: Payer: Commercial Managed Care - PPO

## 2012-02-02 ENCOUNTER — Encounter: Payer: Self-pay | Admitting: Internal Medicine

## 2012-02-02 ENCOUNTER — Ambulatory Visit (INDEPENDENT_AMBULATORY_CARE_PROVIDER_SITE_OTHER): Payer: PRIVATE HEALTH INSURANCE | Admitting: Internal Medicine

## 2012-02-02 VITALS — BP 136/80 | HR 72 | Temp 98.2°F | Resp 16 | Ht 68.0 in | Wt 160.0 lb

## 2012-02-02 DIAGNOSIS — Z0000 Encounter for general adult medical examination without abnormal findings: Secondary | ICD-10-CM

## 2012-02-02 DIAGNOSIS — E785 Hyperlipidemia, unspecified: Secondary | ICD-10-CM

## 2012-02-02 MED ORDER — RED YEAST RICE 600 MG PO TABS: 1.0000 | ORAL_TABLET | Freq: Two times a day (BID) | ORAL | Status: DC

## 2012-02-02 NOTE — Progress Notes (Signed)
Subjective:    Patient ID: Caleb Obrien, male    DOB: Oct 02, 1965, 46 y.o.   MRN: [id number]  HPI CPX    Review of Systems  Constitutional: Negative for fever and fatigue.  HENT: Negative for hearing loss, congestion, neck pain and postnasal drip.   Eyes: Negative for discharge, redness and visual disturbance.  Respiratory: Negative for cough, shortness of breath and wheezing.   Cardiovascular: Negative for leg swelling.  Gastrointestinal: Negative for abdominal pain, constipation and abdominal distention.  Genitourinary: Negative for urgency and frequency.  Musculoskeletal: Negative for joint swelling and arthralgias.  Skin: Negative for color change and rash.  Neurological: Negative for weakness and light-headedness.  Hematological: Negative for adenopathy.  Psychiatric/Behavioral: Negative for behavioral problems.   Past Medical History  Diagnosis Date  . PLANTAR WART, RIGHT 01/23/2009  . Dermatophytosis of foot 11/02/2009  . Hemangioma of skin and subcutaneous tissue 06/30/2009  . HYPERLIPIDEMIA, BORDERLINE 04/21/2008  . ALLERGIC RHINITIS CAUSE UNSPECIFIED 02/24/2009  . ASTHMA 02/05/2007  . BENIGN PROSTATIC HYPERTROPHY, WITH URINARY OBSTRUCTION 05/22/2007  . GANGLION CYST, WRIST, LEFT 03/25/2009  . HEPATITIS B, HX OF 02/05/2007    History   Social History  . Marital Status: Single    Spouse Name: N/A    Number of Children: N/A  . Years of Education: N/A   Occupational History  . Not on file.   Social History Main Topics  . Smoking status: Current Some Day Smoker  . Smokeless tobacco: Not on file   Comment: smokes 1-2 cuigarettes a day  . Alcohol Use: Yes  . Drug Use: No  . Sexually Active: Yes   Other Topics Concern  . Not on file   Social History Narrative  . No narrative on file    No past surgical history on file.  Family History  Problem Relation Age of Onset  . Hypertension Mother   . Stroke Mother   . Arthritis Father     No Known  Allergies  Current Outpatient Prescriptions on File Prior to Visit  Medication Sig Dispense Refill  . ALPRAZolam (XANAX) 0.25 MG tablet 1/2 tab as needed  30 tablet  0  . buPROPion (WELLBUTRIN SR) 150 MG 12 hr tablet Take 1 tablet (150 mg total) by mouth 2 (two) times daily.  60 tablet  3  . ibuprofen (ADVIL,MOTRIN) 200 MG tablet Take 200 mg by mouth every 6 (six) hours as needed.        Marland Kitchen levocetirizine (XYZAL) 5 MG tablet TAKE 1 TABLET BY MOUTH TWICE DAILY AS NEEDED FOR SEVERE ALLERGIES  60 tablet  2    BP 136/80  Pulse 72  Temp 98.2 F (36.8 C)  Resp 16  Ht 5\' 8"  (1.727 m)  Wt 160 lb (72.576 kg)  BMI 24.33 kg/m2       Objective:   Physical Exam  Nursing note and vitals reviewed. Constitutional: He is oriented to person, place, and time. He appears well-developed and well-nourished.  HENT:  Head: Normocephalic and atraumatic.  Eyes: Conjunctivae are normal. Pupils are equal, round, and reactive to light.  Neck: Normal range of motion. Neck supple.  Cardiovascular: Normal rate and regular rhythm.   Pulmonary/Chest: Effort normal and breath sounds normal.  Abdominal: Soft. Bowel sounds are normal.  Genitourinary: Rectum normal and prostate normal.  Musculoskeletal: Normal range of motion.  Neurological: He is alert and oriented to person, place, and time.  Skin: Skin is warm and dry.  Assessment & Plan:   Patient presents for yearly preventative medicine examination.   all immunizations and health maintenance protocols were reviewed with the patient and they are up to date with these protocols.   screening laboratory values were reviewed with the patient including screening of hyperlipidemia PSA renal function and hepatic function.   There medications past medical history social history problem list and allergies were reviewed in detail.   Goals were established with regard to weight loss exercise diet in compliance with medications   Elevated  lipids Add red rice yeast

## 2012-02-02 NOTE — Patient Instructions (Signed)
Recommend red rice yeast to continue to take it twice daily or 2 tablets at bedtime

## 2012-02-10 ENCOUNTER — Encounter: Payer: Commercial Managed Care - PPO | Admitting: Internal Medicine

## 2012-03-20 ENCOUNTER — Encounter: Payer: Self-pay | Admitting: Family Medicine

## 2012-03-20 ENCOUNTER — Ambulatory Visit (INDEPENDENT_AMBULATORY_CARE_PROVIDER_SITE_OTHER): Payer: PRIVATE HEALTH INSURANCE | Admitting: Family Medicine

## 2012-03-20 VITALS — BP 124/78 | HR 95 | Temp 99.0°F | Wt 164.0 lb

## 2012-03-20 DIAGNOSIS — L290 Pruritus ani: Secondary | ICD-10-CM

## 2012-03-20 DIAGNOSIS — Z23 Encounter for immunization: Secondary | ICD-10-CM

## 2012-03-20 DIAGNOSIS — B360 Pityriasis versicolor: Secondary | ICD-10-CM

## 2012-03-20 MED ORDER — KETOCONAZOLE 200 MG PO TABS: 200.0000 mg | ORAL_TABLET | Freq: Two times a day (BID) | ORAL | Status: DC

## 2012-03-20 MED ORDER — HYDROCORTISONE ACE-PRAMOXINE 2.5-1 % RE CREA: TOPICAL_CREAM | Freq: Three times a day (TID) | RECTAL | Status: DC

## 2012-03-20 NOTE — Addendum Note (Signed)
Addended by: Aniceto Boss A on: 03/20/2012 04:49 PM   Modules accepted: Orders

## 2012-03-20 NOTE — Progress Notes (Signed)
  Subjective:    Patient ID: Caleb Obrien, male    DOB: 07/24/1965, 46 y.o.   MRN: [id number]  HPI Here for 2 things. First for several months he has had an ithcy rash over the chest. Second for several months he has had itching around the anus. No rash or hemorrhoids. Using tea tree oil.    Review of Systems  Constitutional: Negative.   Skin: Positive for rash.       Objective:   Physical Exam  Constitutional: He appears well-developed and well-nourished.  Skin:       Patches of macular hypopigmented skin over the chest           Assessment & Plan:  Given 30 days of oral ketoconazole. Apply Selsun Blue shampoo to the chest bid and apply Analpram cream to the anal area bid.

## 2012-04-16 ENCOUNTER — Other Ambulatory Visit (INDEPENDENT_AMBULATORY_CARE_PROVIDER_SITE_OTHER): Payer: PRIVATE HEALTH INSURANCE

## 2012-04-16 DIAGNOSIS — E785 Hyperlipidemia, unspecified: Secondary | ICD-10-CM

## 2012-04-16 DIAGNOSIS — Z0000 Encounter for general adult medical examination without abnormal findings: Secondary | ICD-10-CM

## 2012-04-16 LAB — LIPID PANEL
Cholesterol: 176 mg/dL (ref 0–200)
HDL: 45.7 mg/dL (ref >39.00)
LDL Cholesterol: 115 mg/dL — ABNORMAL HIGH (ref 0–99)
Total CHOL/HDL Ratio: 4
Triglycerides: 77 mg/dL (ref 0.0–149.0)
VLDL: 15.4 mg/dL (ref 0.0–40.0)

## 2012-04-16 LAB — HEPATIC FUNCTION PANEL
ALT: 21 U/L (ref 0–53)
AST: 18 U/L (ref 0–37)
Albumin: 3.9 g/dL (ref 3.5–5.2)
Alkaline Phosphatase: 56 U/L (ref 39–117)
Bilirubin, Direct: 0.1 mg/dL (ref 0.0–0.3)
Total Bilirubin: 0.4 mg/dL (ref 0.3–1.2)
Total Protein: 6.5 g/dL (ref 6.0–8.3)

## 2012-05-01 ENCOUNTER — Telehealth: Payer: Self-pay | Admitting: Internal Medicine

## 2012-05-01 ENCOUNTER — Ambulatory Visit (INDEPENDENT_AMBULATORY_CARE_PROVIDER_SITE_OTHER): Payer: PRIVATE HEALTH INSURANCE | Admitting: Internal Medicine

## 2012-05-01 ENCOUNTER — Encounter: Payer: Self-pay | Admitting: Internal Medicine

## 2012-05-01 VITALS — BP 120/74 | HR 68 | Temp 98.2°F | Resp 16 | Ht 68.0 in | Wt 158.0 lb

## 2012-05-01 DIAGNOSIS — B360 Pityriasis versicolor: Secondary | ICD-10-CM

## 2012-05-01 DIAGNOSIS — E785 Hyperlipidemia, unspecified: Secondary | ICD-10-CM

## 2012-05-01 MED ORDER — KETOCONAZOLE 2 % EX SHAM: 1.0000 "application " | MEDICATED_SHAMPOO | CUTANEOUS | Status: DC

## 2012-05-01 NOTE — Telephone Encounter (Signed)
Pt would like to add 2 test to his CPX labs in August 2004 : PSA and HIV

## 2012-05-01 NOTE — Progress Notes (Signed)
  Subjective:    Patient ID: Caleb Obrien, male    DOB: [birth year], 46 y.o.   MRN: [id number]  Hyperlipidemia Pertinent negatives include no shortness of breath.  Asthma There is no cough, shortness of breath or wheezing. Pertinent negatives include no fever or postnasal drip. His past medical history is significant for asthma.   ED Tinea versicolor Anus puris Lipid follow up    Review of Systems  Constitutional: Negative for fever and fatigue.  HENT: Negative for hearing loss, congestion, neck pain and postnasal drip.   Eyes: Negative for discharge, redness and visual disturbance.  Respiratory: Negative for cough, shortness of breath and wheezing.   Cardiovascular: Negative for leg swelling.  Gastrointestinal: Negative for abdominal pain, constipation and abdominal distention.       Anal itching  Genitourinary: Negative for urgency and frequency.  Musculoskeletal: Negative for joint swelling and arthralgias.  Skin: Positive for color change and rash.  Neurological: Negative for weakness and light-headedness.  Hematological: Negative for adenopathy.  Psychiatric/Behavioral: Negative for behavioral problems.       Objective:   Physical Exam  Nursing note reviewed. Constitutional: He is oriented to person, place, and time. He appears well-developed and well-nourished.  HENT:  Head: Normocephalic and atraumatic.  Eyes: Conjunctivae normal are normal. Pupils are equal, round, and reactive to light.  Neck: Normal range of motion. Neck supple.  Cardiovascular: Normal rate and regular rhythm.   Pulmonary/Chest: Effort normal and breath sounds normal.  Abdominal: Soft. Bowel sounds are normal.  Musculoskeletal: Normal range of motion.  Neurological: He is alert and oriented to person, place, and time.  Skin: Skin is warm and dry.  Psychiatric: He has a normal mood and affect. His behavior is normal.          Assessment & Plan:  Lipid management review Tinea  versicolor rx for ketoconizole topical due to liver concerns    I have spent more than 30 minutes examining this patient face-to-face of which over half was spent in counseling

## 2012-05-01 NOTE — Telephone Encounter (Signed)
This is a Dr. Jenkins patient  

## 2012-06-09 ENCOUNTER — Other Ambulatory Visit: Payer: Self-pay | Admitting: Internal Medicine

## 2012-06-12 ENCOUNTER — Other Ambulatory Visit: Payer: Self-pay | Admitting: *Deleted

## 2012-06-12 MED ORDER — BUPROPION HCL ER (SR) 150 MG PO TB12: 150.0000 mg | ORAL_TABLET | Freq: Two times a day (BID) | ORAL | Status: DC

## 2012-06-18 ENCOUNTER — Encounter: Payer: Self-pay | Admitting: Family Medicine

## 2012-06-18 ENCOUNTER — Ambulatory Visit (INDEPENDENT_AMBULATORY_CARE_PROVIDER_SITE_OTHER): Payer: PRIVATE HEALTH INSURANCE | Admitting: Family Medicine

## 2012-06-18 VITALS — BP 138/88 | HR 84 | Temp 98.9°F | Wt 168.0 lb

## 2012-06-18 DIAGNOSIS — J069 Acute upper respiratory infection, unspecified: Secondary | ICD-10-CM

## 2012-06-18 MED ORDER — GUAIFENESIN-CODEINE 100-10 MG/5ML PO SYRP: 5.0000 mL | ORAL_SOLUTION | Freq: Three times a day (TID) | ORAL | Status: DC | PRN

## 2012-06-18 NOTE — Progress Notes (Signed)
Chief Complaint  Patient presents with  . head congestion    nose stopped up/ ear pain- itching, sore throat, fever     HPI:  -started: 1 week ago -symptoms:nasal congestion, scratchy throat, cough, subjective fever -denies:fever, SOB, NVD, tooth pain, strep or mono exposure -has tried: tylenol, saline spray, musinex -sick contacts: none known -Hx of: allergies and asthma - reports no issues with his asthma, hx of sinusitis  ROS: See pertinent positives and negatives per HPI.  Past Medical History  Diagnosis Date  . PLANTAR WART, RIGHT 01/23/2009  . Dermatophytosis of foot 11/02/2009  . Hemangioma of skin and subcutaneous tissue 06/30/2009  . HYPERLIPIDEMIA, BORDERLINE 04/21/2008  . ALLERGIC RHINITIS CAUSE UNSPECIFIED 02/24/2009  . ASTHMA 02/05/2007  . BENIGN PROSTATIC HYPERTROPHY, WITH URINARY OBSTRUCTION 05/22/2007  . GANGLION CYST, WRIST, LEFT 03/25/2009  . HEPATITIS B, HX OF 02/05/2007    Family History  Problem Relation Age of Onset  . Hypertension Mother   . Stroke Mother   . Arthritis Father     History   Social History  . Marital Status: Single    Spouse Name: N/A    Number of Children: N/A  . Years of Education: N/A   Social History Main Topics  . Smoking status: Former Games developer  . Smokeless tobacco: Never Used     Comment: quit as of September 2013  . Alcohol Use: 1.5 oz/week    3 drink(s) per week  . Drug Use: No  . Sexually Active: Yes   Other Topics Concern  . None   Social History Narrative  . None    Current outpatient prescriptions:ALPRAZolam (XANAX) 0.25 MG tablet, 1/2 tab as needed, Disp: 30 tablet, Rfl: 0;  buPROPion (WELLBUTRIN SR) 150 MG 12 hr tablet, Take 1 tablet (150 mg total) by mouth 2 (two) times daily., Disp: 60 tablet, Rfl: 6;  guaiFENesin-codeine (ROBITUSSIN AC) 100-10 MG/5ML syrup, Take 5 mLs by mouth 3 (three) times daily as needed for cough., Disp: 120 mL, Rfl: 0 hydrocortisone-pramoxine (ANALPRAM-HC) 2.5-1 % rectal cream, Place  rectally 3 (three) times daily., Disp: 30 g, Rfl: 2;  ketoconazole (NIZORAL) 2 % shampoo, Apply 1 application topically 2 (two) times a week., Disp: 120 mL, Rfl: 1  EXAM:  Filed Vitals:   06/18/12 1039  BP: 138/88  Pulse: 84  Temp: 98.9 F (37.2 C)    There is no height on file to calculate BMI.  GENERAL: vitals reviewed and listed above, alert, oriented, appears well hydrated and in no acute distress  HEENT: atraumatic, conjunttiva clear, no obvious abnormalities on inspection of external nose and ears, normal appearance of ear canals and TMs, clear nasal congestion, mild post oropharyngeal erythema with PND, no tonsillar edema or exudate, no sinus TTP  NECK: no obvious masses on inspection  LUNGS: clear to auscultation bilaterally, no wheezes, rales or rhonchi, good air movement  CV: HRRR, no peripheral edema  MS: moves all extremities without noticeable abnormality  PSYCH: pleasant and cooperative, no obvious depression or anxiety  ASSESSMENT AND PLAN:  Discussed the following assessment and plan:  1. Upper respiratory infection  guaiFENesin-codeine (ROBITUSSIN AC) 100-10 MG/5ML syrup   -likely viral, discussed tx options - supportive and return precuations -Patient advised to return or notify a doctor immediately if symptoms worsen or persist or new concerns arise.  Patient Instructions  INSTRUCTIONS FOR UPPER RESPIRATORY INFECTION:  -plenty of rest and fluids  -nasal saline wash 2-3 times daily (NEIL MED) (use prepackaged nasal saline or bottled/distilled water  if making your own)   -clean nose with nasal saline before using the nasal steroid or sinex  -can use sinex nasal spray for drainage and nasal congestion - but do NOT use longer then 3-4 days  -can use tylenol or ibuprofen as directed for aches and sorethroat  -in the winter time, using a humidifier at night is helpful (please follow cleaning instructions)  -if you are taking a cough medication - use  only as directed, may also try a teaspoon of honey to coat the throat and throat lozenges  -for sore throat, salt water gargles can help  -follow up if you have fevers, facial pain, tooth pain, difficulty breathing or are worsening or not getting better in 5-7 days      Maks Cavallero R.

## 2012-06-18 NOTE — Patient Instructions (Addendum)
INSTRUCTIONS FOR UPPER RESPIRATORY INFECTION:  -plenty of rest and fluids  -nasal saline wash 2-3 times daily (NEIL MED) (use prepackaged nasal saline or bottled/distilled water if making your own)   -clean nose with nasal saline before using the nasal steroid or sinex  -can use sinex nasal spray for drainage and nasal congestion - but do NOT use longer then 3-4 days  -can use tylenol or ibuprofen as directed for aches and sorethroat  -in the winter time, using a humidifier at night is helpful (please follow cleaning instructions)  -if you are taking a cough medication - use only as directed, may also try a teaspoon of honey to coat the throat and throat lozenges  -for sore throat, salt water gargles can help  -follow up if you have fevers, facial pain, tooth pain, difficulty breathing or are worsening or not getting better in 5-7 days

## 2012-07-08 ENCOUNTER — Other Ambulatory Visit: Payer: Self-pay | Admitting: Internal Medicine

## 2013-01-28 ENCOUNTER — Other Ambulatory Visit (INDEPENDENT_AMBULATORY_CARE_PROVIDER_SITE_OTHER): Payer: PRIVATE HEALTH INSURANCE

## 2013-01-28 DIAGNOSIS — Z0000 Encounter for general adult medical examination without abnormal findings: Secondary | ICD-10-CM

## 2013-01-28 LAB — BASIC METABOLIC PANEL
BUN: 17 mg/dL (ref 6–23)
CO2: 28 mEq/L (ref 19–32)
Calcium: 9.1 mg/dL (ref 8.4–10.5)
Chloride: 105 mEq/L (ref 96–112)
Creatinine, Ser: 1.1 mg/dL (ref 0.4–1.5)
GFR: 78.45 mL/min (ref >60.00)
Glucose, Bld: 79 mg/dL (ref 70–99)
Potassium: 4.2 mEq/L (ref 3.5–5.1)
Sodium: 139 mEq/L (ref 135–145)

## 2013-01-28 LAB — POCT URINALYSIS DIPSTICK
Bilirubin, UA: NEGATIVE
Blood, UA: NEGATIVE
Glucose, UA: NEGATIVE
Ketones, UA: NEGATIVE
Leukocytes, UA: NEGATIVE
Nitrite, UA: NEGATIVE
Protein, UA: NEGATIVE
Spec Grav, UA: >=1.03
Urobilinogen, UA: 0.2
pH, UA: 5.5

## 2013-01-28 LAB — PSA: PSA: 0.56 ng/mL (ref 0.10–4.00)

## 2013-01-28 LAB — HEPATIC FUNCTION PANEL
ALT: 25 U/L (ref 0–53)
AST: 18 U/L (ref 0–37)
Albumin: 4.1 g/dL (ref 3.5–5.2)
Alkaline Phosphatase: 68 U/L (ref 39–117)
Bilirubin, Direct: 0.1 mg/dL (ref 0.0–0.3)
Total Bilirubin: 0.8 mg/dL (ref 0.3–1.2)
Total Protein: 6.7 g/dL (ref 6.0–8.3)

## 2013-01-28 LAB — CBC WITH DIFFERENTIAL/PLATELET
Basophils Absolute: 0 10*3/uL (ref 0.0–0.1)
Basophils Relative: 0.4 % (ref 0.0–3.0)
Eosinophils Absolute: 0.1 10*3/uL (ref 0.0–0.7)
Eosinophils Relative: 0.9 % (ref 0.0–5.0)
HCT: 45 % (ref 39.0–52.0)
Hemoglobin: 15.3 g/dL (ref 13.0–17.0)
Lymphocytes Relative: 26.5 % (ref 12.0–46.0)
Lymphs Abs: 1.9 10*3/uL (ref 0.7–4.0)
MCHC: 34 g/dL (ref 30.0–36.0)
MCV: 98.1 fl (ref 78.0–100.0)
Monocytes Absolute: 1.1 10*3/uL — ABNORMAL HIGH (ref 0.1–1.0)
Monocytes Relative: 14.4 % — ABNORMAL HIGH (ref 3.0–12.0)
Neutro Abs: 4.2 10*3/uL (ref 1.4–7.7)
Neutrophils Relative %: 57.8 % (ref 43.0–77.0)
Platelets: 273 10*3/uL (ref 150.0–400.0)
RBC: 4.58 Mil/uL (ref 4.22–5.81)
RDW: 13.5 % (ref 11.5–14.6)
WBC: 7.3 10*3/uL (ref 4.5–10.5)

## 2013-01-28 LAB — LIPID PANEL
Cholesterol: 180 mg/dL (ref 0–200)
HDL: 44.9 mg/dL (ref >39.00)
LDL Cholesterol: 119 mg/dL — ABNORMAL HIGH (ref 0–99)
Total CHOL/HDL Ratio: 4
Triglycerides: 83 mg/dL (ref 0.0–149.0)
VLDL: 16.6 mg/dL (ref 0.0–40.0)

## 2013-01-28 LAB — TSH: TSH: 3.93 u[IU]/mL (ref 0.35–5.50)

## 2013-01-29 LAB — HIV ANTIBODY (ROUTINE TESTING W REFLEX): HIV: NONREACTIVE

## 2013-02-04 ENCOUNTER — Encounter: Payer: Self-pay | Admitting: Internal Medicine

## 2013-02-04 ENCOUNTER — Ambulatory Visit (INDEPENDENT_AMBULATORY_CARE_PROVIDER_SITE_OTHER): Payer: BC Managed Care – PPO | Admitting: Internal Medicine

## 2013-02-04 VITALS — BP 124/76 | HR 84 | Temp 98.2°F | Resp 16 | Ht 68.0 in | Wt 162.0 lb

## 2013-02-04 DIAGNOSIS — Z0000 Encounter for general adult medical examination without abnormal findings: Secondary | ICD-10-CM

## 2013-02-04 DIAGNOSIS — R197 Diarrhea, unspecified: Secondary | ICD-10-CM

## 2013-02-04 DIAGNOSIS — A09 Infectious gastroenteritis and colitis, unspecified: Secondary | ICD-10-CM

## 2013-02-04 MED ORDER — METRONIDAZOLE 500 MG PO TABS: 500.0000 mg | ORAL_TABLET | Freq: Three times a day (TID) | ORAL | Status: DC

## 2013-02-04 NOTE — Patient Instructions (Signed)
The patient is instructed to continue all medications as prescribed. Schedule followup with check out clerk upon leaving the clinic  

## 2013-02-04 NOTE — Addendum Note (Signed)
Addended by: Stacie Glaze on: 02/04/2013 09:40 AM   Modules accepted: Orders

## 2013-02-04 NOTE — Progress Notes (Signed)
Subjective:    Patient ID: Caleb Obrien, male    DOB: August 23, 1965, 47 y.o.   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  HENT: Negative for hearing loss, congestion, neck pain and postnasal drip.   Eyes: Negative for discharge, redness and visual disturbance.  Respiratory: Negative for cough, shortness of breath and wheezing.   Cardiovascular: Negative for leg swelling.  Gastrointestinal: Negative for abdominal pain, constipation and abdominal distention.  Genitourinary: Negative for urgency and frequency.  Musculoskeletal: Negative for joint swelling and arthralgias.  Skin: Negative for color change and rash.  Neurological: Negative for weakness and light-headedness.  Hematological: Negative for adenopathy.  Psychiatric/Behavioral: Negative for behavioral problems.   Past Medical History  Diagnosis Date  . PLANTAR WART, RIGHT 01/23/2009  . Dermatophytosis of foot 11/02/2009  . Hemangioma of skin and subcutaneous tissue 06/30/2009  . HYPERLIPIDEMIA, BORDERLINE 04/21/2008  . ALLERGIC RHINITIS CAUSE UNSPECIFIED 02/24/2009  . ASTHMA 02/05/2007  . BENIGN PROSTATIC HYPERTROPHY, WITH URINARY OBSTRUCTION 05/22/2007  . GANGLION CYST, WRIST, LEFT 03/25/2009  . HEPATITIS B, HX OF 02/05/2007    History   Social History  . Marital Status: Single    Spouse Name: N/A    Number of Children: N/A  . Years of Education: N/A   Occupational History  . Not on file.   Social History Main Topics  . Smoking status: Former Games developer  . Smokeless tobacco: Never Used     Comment: quit as of September 2013  . Alcohol Use: 1.5 oz/week    3 drink(s) per week  . Drug Use: No  . Sexual Activity: Yes   Other Topics  Concern  . Not on file   Social History Narrative  . No narrative on file    No past surgical history on file.  Family History  Problem Relation Age of Onset  . Hypertension Mother   . Stroke Mother   . Arthritis Father     No Known Allergies  Current Outpatient Prescriptions on File Prior to Visit  Medication Sig Dispense Refill  . ALPRAZolam (XANAX) 0.25 MG tablet TAKE 1/2 TABLET BY MOUTH AS NEEDED  30 tablet  2  . buPROPion (WELLBUTRIN SR) 150 MG 12 hr tablet Take 1 tablet (150 mg total) by mouth 2 (two) times daily.  60 tablet  6  . hydrocortisone-pramoxine (ANALPRAM-HC) 2.5-1 % rectal cream Place rectally 3 (three) times daily.  30 g  2  . ketoconazole (NIZORAL) 2 % shampoo Apply 1 application topically 2 (two) times a week.  120 mL  1   No current facility-administered medications on file prior to visit.    BP 124/76  Pulse 84  Temp(Src) 98.2 F (36.8 C)  Resp 16  Ht 5\' 8"  (1.727 m)  Wt 162 lb (73.483 kg)  BMI 24.64 kg/m2       Objective:   Physical Exam  Nursing note and vitals reviewed. Constitutional: He is oriented to person, place, and time. He appears well-developed and well-nourished.  HENT:  Head: Normocephalic and atraumatic.  Eyes: Conjunctivae are normal. Pupils are equal, round, and reactive to light.  Neck: Normal range of motion. Neck supple.  Cardiovascular: Normal rate  and regular rhythm.   Pulmonary/Chest: Effort normal and breath sounds normal.  Abdominal: Soft. Bowel sounds are normal.  Genitourinary: Rectum normal, prostate normal and penis normal.  Musculoskeletal: Normal range of motion.  Neurological: He is alert and oriented to person, place, and time.  Skin: Skin is dry.  Psychiatric: He has a normal mood and affect. His behavior is normal.          Assessment & Plan:   Patient presents for yearly preventative medicine examination.   all immunizations and health maintenance protocols were reviewed with the patient and  they are up to date with these protocols.   screening laboratory values were reviewed with the patient including screening of hyperlipidemia PSA renal function and hepatic function.   There medications past medical history social history problem list and allergies were reviewed in detail.   Goals were established with regard to weight loss exercise diet in compliance with medications  9 month history of loose stools now with a dumping syndrome after every meal.  Differential diagnosis includes irritable bowel parasitic infection and possible gluten intolerance or lactose intolerance Testing will be appropriate if we can not determine an answer gastroenterology referral would be next up

## 2013-02-05 LAB — CELIAC PANEL 10
Endomysial Screen: NEGATIVE
Gliadin IgA: 4.7 U/mL (ref <20)
Gliadin IgG: 4.7 U/mL (ref <20)
IgA: 266 mg/dL (ref 68–379)
Tissue Transglut Ab: 3.2 U/mL (ref <20)
Tissue Transglutaminase Ab, IgA: 3.1 U/mL (ref <20)

## 2013-02-15 ENCOUNTER — Telehealth: Payer: Self-pay | Admitting: Internal Medicine

## 2013-02-15 NOTE — Telephone Encounter (Signed)
Pt would like to add PSA and HIV to his CPE labs on 01/29/14.

## 2013-02-15 NOTE — Telephone Encounter (Signed)
This was already done.  Left message for pt to call back

## 2013-02-25 ENCOUNTER — Encounter: Payer: Self-pay | Admitting: Internal Medicine

## 2013-02-25 DIAGNOSIS — M47812 Spondylosis without myelopathy or radiculopathy, cervical region: Secondary | ICD-10-CM

## 2013-03-05 ENCOUNTER — Ambulatory Visit (INDEPENDENT_AMBULATORY_CARE_PROVIDER_SITE_OTHER)
Admission: RE | Admit: 2013-03-05 | Discharge: 2013-03-05 | Disposition: A | Discharge status: Home / Self Care | Payer: BC Managed Care – PPO | Source: Ambulatory Visit | Attending: Internal Medicine | Admitting: Internal Medicine

## 2013-03-05 DIAGNOSIS — M47812 Spondylosis without myelopathy or radiculopathy, cervical region: Secondary | ICD-10-CM

## 2013-03-08 ENCOUNTER — Encounter: Payer: Self-pay | Admitting: Internal Medicine

## 2013-03-08 DIAGNOSIS — M542 Cervicalgia: Secondary | ICD-10-CM

## 2013-03-08 DIAGNOSIS — M779 Enthesopathy, unspecified: Secondary | ICD-10-CM

## 2013-04-15 ENCOUNTER — Other Ambulatory Visit: Payer: Self-pay | Admitting: Internal Medicine

## 2013-04-18 ENCOUNTER — Other Ambulatory Visit: Payer: Self-pay

## 2013-05-22 ENCOUNTER — Encounter: Payer: Self-pay | Admitting: Family Medicine

## 2013-05-22 ENCOUNTER — Ambulatory Visit: Payer: BC Managed Care – PPO | Admitting: Family Medicine

## 2013-05-22 ENCOUNTER — Ambulatory Visit (INDEPENDENT_AMBULATORY_CARE_PROVIDER_SITE_OTHER): Payer: BC Managed Care – PPO | Admitting: Family Medicine

## 2013-05-22 VITALS — BP 150/98 | Temp 99.5°F | Wt 158.0 lb

## 2013-05-22 DIAGNOSIS — R0989 Other specified symptoms and signs involving the circulatory and respiratory systems: Secondary | ICD-10-CM

## 2013-05-22 DIAGNOSIS — R0683 Snoring: Secondary | ICD-10-CM

## 2013-05-22 DIAGNOSIS — R0609 Other forms of dyspnea: Secondary | ICD-10-CM

## 2013-05-22 NOTE — Progress Notes (Signed)
Chief Complaint  Patient presents with  . Insomnia    HPI:  Caleb Obrien, a 47 yo pt of Dr. Lovell Sheehan, here for an acute visit for:  Snoring: -started: a long time ago - many years ago -snores really loud and thinks may have apneic spells at times -trouble initiating sleep? no -trouble staying asleep? And wakes up others from his snoring -no daytime somnelence -feels like might have deviated septum too because sinus rinse does not flow as well on one side -denies: fevers, night sweats, malaise, dyspnea, depression, thoughts of self harm, HTN. Heart disease, weight gain over last several years, hx of surgery on tonsils, adenoids or sinuses -wants sleep study before the end of the year due to insurance  ROS: See pertinent positives and negatives per HPI.  Past Medical History  Diagnosis Date  . PLANTAR WART, RIGHT 01/23/2009  . Dermatophytosis of foot 11/02/2009  . Hemangioma of skin and subcutaneous tissue 06/30/2009  . HYPERLIPIDEMIA, BORDERLINE 04/21/2008  . ALLERGIC RHINITIS CAUSE UNSPECIFIED 02/24/2009  . ASTHMA 02/05/2007  . BENIGN PROSTATIC HYPERTROPHY, WITH URINARY OBSTRUCTION 05/22/2007  . GANGLION CYST, WRIST, LEFT 03/25/2009  . HEPATITIS B, HX OF 02/05/2007    No past surgical history on file.  Family History  Problem Relation Age of Onset  . Hypertension Mother   . Stroke Mother   . Arthritis Father     History   Social History  . Marital Status: Single    Spouse Name: N/A    Number of Children: N/A  . Years of Education: N/A   Social History Main Topics  . Smoking status: Former Games developer  . Smokeless tobacco: Never Used     Comment: quit as of September 2013  . Alcohol Use: 1.5 oz/week    3 drink(s) per week  . Drug Use: No  . Sexual Activity: Yes   Other Topics Concern  . None   Social History Narrative  . None    Current outpatient prescriptions:ALPRAZolam (XANAX) 0.25 MG tablet, TAKE 1/2 TABLET BY MOUTH AS NEEDED, Disp: 30 tablet, Rfl: 2;  buPROPion  (WELLBUTRIN SR) 150 MG 12 hr tablet, TAKE 1 TABLET TWICE DAILY, Disp: 60 tablet, Rfl: 5;  hydrocortisone-pramoxine (ANALPRAM-HC) 2.5-1 % rectal cream, Place rectally 3 (three) times daily., Disp: 30 g, Rfl: 2 ketoconazole (NIZORAL) 2 % shampoo, Apply 1 application topically 2 (two) times a week., Disp: 120 mL, Rfl: 1;  etodolac (LODINE) 400 MG tablet, , Disp: , Rfl:   EXAM:  Filed Vitals:   05/22/13 1502  BP: 150/98  Temp: 99.5 F (37.5 C)    Body mass index is 24.03 kg/(m^2).  GENERAL: vitals reviewed and listed above, alert, oriented, appears well hydrated and in no acute distress  HEENT: atraumatic, conjunttiva clear, no obvious abnormalities on inspection of external nose and ears  NECK: no obvious masses on inspection  LUNGS: clear to auscultation bilaterally, no wheezes, rales or rhonchi, good air movement  CV: HRRR, no peripheral edema  MS: moves all extremities without noticeable abnormality  PSYCH: pleasant and cooperative, no obvious depression or anxiety  ASSESSMENT AND PLAN:  Discussed the following assessment and plan:  Snoring - Plan: Nocturnal polysomnography (NPSG)  -sleep study per his request -follow up with PCP -Patient advised to return or notify a doctor immediately if symptoms worsen or persist or new concerns arise.  There are no Patient Instructions on file for this visit.   Kriste Basque R.

## 2013-05-24 NOTE — Addendum Note (Signed)
Addended by: Azucena Freed on: 05/24/2013 01:07 PM   Modules accepted: Orders

## 2013-05-30 ENCOUNTER — Ambulatory Visit (INDEPENDENT_AMBULATORY_CARE_PROVIDER_SITE_OTHER): Payer: BC Managed Care – PPO | Admitting: Family

## 2013-05-30 ENCOUNTER — Encounter: Payer: Self-pay | Admitting: Family

## 2013-05-30 VITALS — BP 132/102 | HR 87 | Wt 158.0 lb

## 2013-05-30 DIAGNOSIS — I10 Essential (primary) hypertension: Secondary | ICD-10-CM

## 2013-05-30 DIAGNOSIS — F32A Depression, unspecified: Secondary | ICD-10-CM

## 2013-05-30 DIAGNOSIS — F329 Major depressive disorder, single episode, unspecified: Secondary | ICD-10-CM

## 2013-05-30 MED ORDER — LISINOPRIL-HYDROCHLOROTHIAZIDE 10-12.5 MG PO TABS: 1.0000 | ORAL_TABLET | Freq: Every day | ORAL | Status: DC

## 2013-05-30 NOTE — Patient Instructions (Signed)
Sodium-Controlled Diet Sodium is a mineral. It is found in many foods. Sodium may be found naturally or added during the making of a food. The most common form of sodium is salt, which is made up of sodium and chloride. Reducing your sodium intake involves changing your eating habits. The following guidelines will help you reduce the sodium in your diet:  Stop using the salt shaker.  Use salt sparingly in cooking and baking.  Substitute with sodium-free seasonings and spices.  Do not use a salt substitute (potassium chloride) without your caregiver's permission.  Include a variety of fresh, unprocessed foods in your diet.  Limit the use of processed and convenience foods that are high in sodium. USE THE FOLLOWING FOODS SPARINGLY: Breads/Starches  Commercial bread stuffing, commercial pancake or waffle mixes, coating mixes. Waffles. Croutons. Prepared (boxed or frozen) potato, rice, or noodle mixes that contain salt or sodium. Salted French fries or hash browns. Salted popcorn, breads, crackers, chips, or snack foods. Vegetables  Vegetables canned with salt or prepared in cream, butter, or cheese sauces. Sauerkraut. Tomato or vegetable juices canned with salt.  Fresh vegetables are allowed if rinsed thoroughly. Fruit  Fruit is okay to eat. Meat and Meat Substitutes  Salted or smoked meats, such as bacon or Canadian bacon, chipped or corned beef, hot dogs, salt pork, luncheon meats, pastrami, ham, or sausage. Canned or smoked fish, poultry, or meat. Processed cheese or cheese spreads, blue or Roquefort cheese. Battered or frozen fish products. Prepared spaghetti sauce. Baked beans. Reuben sandwiches. Salted nuts. Caviar. Milk  Limit buttermilk to 1 cup per week. Soups and Combination Foods  Bouillon cubes, canned or dried soups, broth, consomm. Convenience (frozen or packaged) dinners with more than 600 mg sodium. Pot pies, pizza, Asian food, fast food cheeseburgers, and specialty  sandwiches. Desserts and Sweets  Regular (salted) desserts, pie, commercial fruit snack pies, commercial snack cakes, canned puddings.  Eat desserts and sweets in moderation. Fats and Oils  Gravy mixes or canned gravy. No more than 1 to 2 tbs of salad dressing. Chip dips.  Eat fats and oils in moderation. Beverages  See those listed under the vegetables and milk groups. Condiments  Ketchup, mustard, meat sauces, salsa, regular (salted) and lite soy sauce or mustard. Dill pickles, olives, meat tenderizer. Prepared horseradish or pickle relish. Dutch-processed cocoa. Baking powder or baking soda used medicinally. Worcestershire sauce. "Light" salt. Salt substitute, unless approved by your caregiver. Document Released: 11/19/2001 Document Revised: 08/22/2011 Document Reviewed: 06/22/2009 [employer] Patient Information 2014 [employer].  

## 2013-05-31 ENCOUNTER — Encounter: Payer: Self-pay | Admitting: Family

## 2013-05-31 NOTE — Progress Notes (Signed)
Subjective:    Patient ID: Caleb Obrien, male    DOB: 25-Nov-1965, 47 y.o.   MRN: [id number]  HPI 47 year old male, nonsmoker, patient of Dr. Lovell Sheehan is in today with complaints of elevated blood pressure. He reports that his last several office visits he had elevated diastolic readings. He denies any chest pain, palpitations, shortness of breath or edema. He has a family history of hypertension cardiovascular disease, and strokes. Requesting a flu shot today. Also reports no history of hepatitis B.   Review of Systems  Constitutional: Negative.   HENT: Negative.   Respiratory: Negative.   Cardiovascular:       Elevated blood pressure  Gastrointestinal: Negative.   Endocrine: Negative.   Genitourinary: Negative.   Musculoskeletal: Negative.   Skin: Negative.   Neurological: Negative.   Hematological: Negative.   Psychiatric/Behavioral: Negative.    Past Medical History  Diagnosis Date  . PLANTAR WART, RIGHT 01/23/2009  . Dermatophytosis of foot 11/02/2009  . Hemangioma of skin and subcutaneous tissue 06/30/2009  . HYPERLIPIDEMIA, BORDERLINE 04/21/2008  . ALLERGIC RHINITIS CAUSE UNSPECIFIED 02/24/2009  . ASTHMA 02/05/2007  . BENIGN PROSTATIC HYPERTROPHY, WITH URINARY OBSTRUCTION 05/22/2007  . GANGLION CYST, WRIST, LEFT 03/25/2009  . HEPATITIS B, HX OF 02/05/2007    History   Social History  . Marital Status: Single    Spouse Name: N/A    Number of Children: N/A  . Years of Education: N/A   Occupational History  . Not on file.   Social History Main Topics  . Smoking status: Former Games developer  . Smokeless tobacco: Never Used     Comment: quit as of September 2013  . Alcohol Use: 1.5 oz/week    3 drink(s) per week  . Drug Use: No  . Sexual Activity: Yes   Other Topics Concern  . Not on file   Social History Narrative  . No narrative on file    History reviewed. No pertinent past surgical history.  Family History  Problem Relation Age of Onset  . Hypertension  Mother   . Stroke Mother   . Arthritis Father     No Known Allergies  Current Outpatient Prescriptions on File Prior to Visit  Medication Sig Dispense Refill  . ALPRAZolam (XANAX) 0.25 MG tablet TAKE 1/2 TABLET BY MOUTH AS NEEDED  30 tablet  2  . hydrocortisone-pramoxine (ANALPRAM-HC) 2.5-1 % rectal cream Place rectally 3 (three) times daily.  30 g  2  . ketoconazole (NIZORAL) 2 % shampoo Apply 1 application topically 2 (two) times a week.  120 mL  1  . buPROPion (WELLBUTRIN SR) 150 MG 12 hr tablet TAKE 1 TABLET TWICE DAILY  60 tablet  5  . etodolac (LODINE) 400 MG tablet        No current facility-administered medications on file prior to visit.    BP 132/102  Pulse 87  Wt 158 lb (71.668 kg)chart    Objective:   Physical Exam  Constitutional: He is oriented to person, place, and time. He appears well-developed and well-nourished.  Neck: Normal range of motion. Neck supple.  Cardiovascular: Normal rate, regular rhythm and normal heart sounds.   Pulmonary/Chest: Effort normal and breath sounds normal.  Abdominal: Soft. Bowel sounds are normal.  Musculoskeletal: Normal range of motion.  Neurological: He is alert and oriented to person, place, and time.  Skin: Skin is warm and dry.  Psychiatric: He has a normal mood and affect.          Assessment &  Plan:  Assessment: 1. Hypertension 2. Depression 3. Hyperlipidemia  Plan: Start lisinopril HCT 10/12.5 once daily. Return in 3 weeks for recheck. Encouraged healthy diet, exercise, low sodium diet. In the

## 2013-06-04 ENCOUNTER — Encounter: Payer: Self-pay | Admitting: Family

## 2013-06-04 ENCOUNTER — Ambulatory Visit (HOSPITAL_BASED_OUTPATIENT_CLINIC_OR_DEPARTMENT_OTHER): Payer: BC Managed Care – PPO | Attending: Family Medicine

## 2013-06-04 VITALS — Ht 68.0 in | Wt 160.0 lb

## 2013-06-04 DIAGNOSIS — G4733 Obstructive sleep apnea (adult) (pediatric): Secondary | ICD-10-CM | POA: Insufficient documentation

## 2013-06-04 DIAGNOSIS — Z9989 Dependence on other enabling machines and devices: Secondary | ICD-10-CM

## 2013-06-04 DIAGNOSIS — R0609 Other forms of dyspnea: Secondary | ICD-10-CM | POA: Insufficient documentation

## 2013-06-04 DIAGNOSIS — R0683 Snoring: Secondary | ICD-10-CM

## 2013-06-04 DIAGNOSIS — R0989 Other specified symptoms and signs involving the circulatory and respiratory systems: Secondary | ICD-10-CM | POA: Insufficient documentation

## 2013-06-08 DIAGNOSIS — R0989 Other specified symptoms and signs involving the circulatory and respiratory systems: Secondary | ICD-10-CM

## 2013-06-08 DIAGNOSIS — G4733 Obstructive sleep apnea (adult) (pediatric): Secondary | ICD-10-CM

## 2013-06-08 DIAGNOSIS — R0609 Other forms of dyspnea: Secondary | ICD-10-CM

## 2013-06-08 NOTE — Sleep Study (Signed)
   NAME: Caleb Obrien DATE OF BIRTH:  April 18, 1966 MEDICAL RECORD NUMBER [id number]  LOCATION: [facility]  PHYSICIAN: YOUNG,CLINTON D  DATE OF STUDY: 06/04/2013  SLEEP STUDY TYPE: Nocturnal Polysomnogram               REFERRING PHYSICIAN: Kriste Basque R., DO  INDICATION FOR STUDY: Hypersomnia with sleep apnea  EPWORTH SLEEPINESS SCORE:   11/24 HEIGHT: 5\' 8"  (172.7 cm)  WEIGHT: 72.576 kg (160 lb)    Body mass index is 24.33 kg/(m^2).  NECK SIZE: 16 in.  MEDICATIONS: Charted for review  SLEEP ARCHITECTURE: Split study protocol. During the diagnostic phase total sleep time 122 minutes with sleep efficiency 62.4%. Stage I was 9%, stage II 80.3%, stage III absent, REM 10.7% of total sleep time. Sleep latency 66.5 minutes, REM latency 11 minutes, awake after sleep onset 8 minutes, arousal index 12.3. Bedtime medication: None.  RESPIRATORY DATA: Split study protocol. AHI 21.1 per hour. All events were hypopneas, noted in all sleep positions. REM AHI 18.5 per hour. CPAP titrated to 6 CWP, AHI 0.7 per hour. He wore a medium [employer] mask with heated humidifier.  OXYGEN DATA: Moderate snoring before CPAP with oxygen desaturation to a nadir of 89% on room air. With CPAP control, snoring was prevented and mean oxygen saturation held 95.9% on room air.  CARDIAC DATA: Normal sinus rhythm with PVCs  MOVEMENT/PARASOMNIA: Frequent limb jerks with minimal associated sleep disturbance.  IMPRESSION/ RECOMMENDATION:   1) Moderate obstructive sleep apnea/hypopnea syndrome, AHI 21.1 per hour with events in all positions. Moderate snoring with oxygen desaturation to a nadir of 89% on room air. 2) Successful CPAP titration to 6 CWP, AHI 0.7 per hour. He wore a medium [employer] Eson mask with heated humidifier. Snoring was prevented and mean oxygen saturation held 95.9% on room air.  Signed Jetty Duhamel M.D. Waymon Budge Diplomate, American Board of Sleep  Medicine  ELECTRONICALLY SIGNED ON:  06/08/2013, 11:59 AM [facility] PH: [phone]   FX: [phone] ACCREDITED BY THE AMERICAN ACADEMY OF SLEEP MEDICINE

## 2013-06-11 ENCOUNTER — Other Ambulatory Visit: Payer: Self-pay | Admitting: Family Medicine

## 2013-06-11 DIAGNOSIS — G4733 Obstructive sleep apnea (adult) (pediatric): Secondary | ICD-10-CM

## 2013-06-14 ENCOUNTER — Encounter: Payer: Self-pay | Admitting: Family Medicine

## 2013-06-14 ENCOUNTER — Telehealth: Payer: Self-pay | Admitting: *Deleted

## 2013-06-14 DIAGNOSIS — G4733 Obstructive sleep apnea (adult) (pediatric): Secondary | ICD-10-CM

## 2013-06-14 NOTE — Progress Notes (Signed)
Seen for acute visit and sleep study ordered. Has mod OSA. Please give to PCP for review and to decide if he would like to coordinate management or if he would like for me to do this.

## 2013-06-20 ENCOUNTER — Encounter: Payer: Self-pay | Admitting: Family

## 2013-06-20 ENCOUNTER — Telehealth: Payer: Self-pay

## 2013-06-20 ENCOUNTER — Ambulatory Visit (INDEPENDENT_AMBULATORY_CARE_PROVIDER_SITE_OTHER): Payer: BC Managed Care – PPO | Admitting: Family

## 2013-06-20 VITALS — BP 108/70 | HR 72 | Wt 160.0 lb

## 2013-06-20 DIAGNOSIS — F411 Generalized anxiety disorder: Secondary | ICD-10-CM

## 2013-06-20 DIAGNOSIS — I10 Essential (primary) hypertension: Secondary | ICD-10-CM

## 2013-06-20 NOTE — Telephone Encounter (Signed)
Spoke with pt to advise that stool cx results are negative, per [facility] results reviewed by [facility]. Copy mailed to pt per his request

## 2013-06-20 NOTE — Progress Notes (Signed)
Subjective:    Patient ID: Caleb Obrien, male    DOB: March 05, 1966, 48 y.o.   MRN: [id number]  HPI 48 year old white male, nonsmoker, patient of Dr. Arnoldo Morale is in today for recheck of hypertension. At his last office visit we started lisinopril HCT 10/12.5. He has been doing well on the medication. He has subsequently stopped his Wellbutrin due to fear that it was causing his blood pressure to be elevated. He is doing well off Wellbutrin. He is trying to follow a healthy diet and exercise as well. Tolerating medication well.   Review of Systems  Constitutional: Negative.   HENT: Negative.   Cardiovascular: Negative.   Gastrointestinal: Negative.   Endocrine: Negative.   Genitourinary: Negative.   Musculoskeletal: Negative.   Allergic/Immunologic: Negative.   Neurological: Negative.   Hematological: Negative.   Psychiatric/Behavioral: Negative.    Past Medical History  Diagnosis Date  . PLANTAR WART, RIGHT 01/23/2009  . Dermatophytosis of foot 11/02/2009  . Hemangioma of skin and subcutaneous tissue 06/30/2009  . [place], [place] 04/21/2008  . ALLERGIC RHINITIS CAUSE UNSPECIFIED 02/24/2009  . ASTHMA 02/05/2007  . BENIGN PROSTATIC HYPERTROPHY, WITH URINARY OBSTRUCTION 05/22/2007  . GANGLION CYST, WRIST, LEFT 03/25/2009  . HEPATITIS B, HX OF 02/05/2007    History   Social History  . Marital Status: Single    Spouse Name: N/A    Number of Children: N/A  . Years of Education: N/A   Occupational History  . Not on file.   Social History Main Topics  . Smoking status: Former Research scientist (life sciences)  . Smokeless tobacco: Never Used     Comment: quit as of September 2013  . Alcohol Use: 1.5 oz/week    3 drink(s) per week  . Drug Use: No  . Sexual Activity: Yes   Other Topics Concern  . Not on file   Social History Narrative  . No narrative on file    History reviewed. No pertinent past surgical history.  Family History  Problem Relation Age of Onset  . Hypertension Mother    . Stroke Mother   . Arthritis Father     No Known Allergies  Current Outpatient Prescriptions on File Prior to Visit  Medication Sig Dispense Refill  . ALPRAZolam (XANAX) 0.25 MG tablet TAKE 1/2 TABLET BY MOUTH AS NEEDED  30 tablet  2  . hydrocortisone-pramoxine (ANALPRAM-HC) 2.5-1 % rectal cream Place rectally 3 (three) times daily.  30 g  2  . ketoconazole (NIZORAL) 2 % shampoo Apply 1 application topically 2 (two) times a week.  120 mL  1  . lisinopril-hydrochlorothiazide (PRINZIDE,ZESTORETIC) 10-12.5 MG per tablet Take 1 tablet by mouth daily.  30 tablet  3  . buPROPion (WELLBUTRIN SR) 150 MG 12 hr tablet TAKE 1 TABLET TWICE DAILY  60 tablet  5  . etodolac (LODINE) 400 MG tablet        No current facility-administered medications on file prior to visit.    BP 108/70  Pulse 72  Wt 160 lb (72.576 kg)chart    Objective:   Physical Exam  Constitutional: He is oriented to person, place, and time. He appears well-developed and well-nourished.  Neck: Normal range of motion. Neck supple.  Cardiovascular: Normal rate, regular rhythm and normal heart sounds.   Recheck blood pressure 118/70  Pulmonary/Chest: Effort normal and breath sounds normal.  Abdominal: Soft. Bowel sounds are normal.  Musculoskeletal: Normal range of motion. He exhibits no edema.  Neurological: He is alert and oriented to person,  place, and time.  Skin: Skin is warm and dry.  Psychiatric: He has a normal mood and affect.          Assessment & Plan:  Assessment: 1. Hypertension 2. Anxiety  Plan: Continue current medication. Follow with Dr. Arnoldo Morale as scheduled. Encouraged a healthy diet and exercise.

## 2013-06-20 NOTE — Patient Instructions (Signed)
Sodium-Controlled Diet Sodium is a mineral. It is found in many foods. Sodium may be found naturally or added during the making of a food. The most common form of sodium is salt, which is made up of sodium and chloride. Reducing your sodium intake involves changing your eating habits. The following guidelines will help you reduce the sodium in your diet:  Stop using the salt shaker.  Use salt sparingly in cooking and baking.  Substitute with sodium-free seasonings and spices.  Do not use a salt substitute (potassium chloride) without your caregiver's permission.  Include a variety of fresh, unprocessed foods in your diet.  Limit the use of processed and convenience foods that are high in sodium. USE THE FOLLOWING FOODS SPARINGLY: Breads/Starches  Commercial bread stuffing, commercial pancake or waffle mixes, coating mixes. Waffles. Croutons. Prepared (boxed or frozen) potato, rice, or noodle mixes that contain salt or sodium. Salted French fries or hash browns. Salted popcorn, breads, crackers, chips, or snack foods. Vegetables  Vegetables canned with salt or prepared in cream, butter, or cheese sauces. Sauerkraut. Tomato or vegetable juices canned with salt.  Fresh vegetables are allowed if rinsed thoroughly. Fruit  Fruit is okay to eat. Meat and Meat Substitutes  Salted or smoked meats, such as bacon or Canadian bacon, chipped or corned beef, hot dogs, salt pork, luncheon meats, pastrami, ham, or sausage. Canned or smoked fish, poultry, or meat. Processed cheese or cheese spreads, blue or Roquefort cheese. Battered or frozen fish products. Prepared spaghetti sauce. Baked beans. Reuben sandwiches. Salted nuts. Caviar. Milk  Limit buttermilk to 1 cup per week. Soups and Combination Foods  Bouillon cubes, canned or dried soups, broth, consomm. Convenience (frozen or packaged) dinners with more than 600 mg sodium. Pot pies, pizza, Asian food, fast food cheeseburgers, and specialty  sandwiches. Desserts and Sweets  Regular (salted) desserts, pie, commercial fruit snack pies, commercial snack cakes, canned puddings.  Eat desserts and sweets in moderation. Fats and Oils  Gravy mixes or canned gravy. No more than 1 to 2 tbs of salad dressing. Chip dips.  Eat fats and oils in moderation. Beverages  See those listed under the vegetables and milk groups. Condiments  Ketchup, mustard, meat sauces, salsa, regular (salted) and lite soy sauce or mustard. Dill pickles, olives, meat tenderizer. Prepared horseradish or pickle relish. Dutch-processed cocoa. Baking powder or baking soda used medicinally. Worcestershire sauce. "Light" salt. Salt substitute, unless approved by your caregiver. Document Released: 11/19/2001 Document Revised: 08/22/2011 Document Reviewed: 06/22/2009 [employer] Patient Information 2014 [employer].  

## 2013-06-21 ENCOUNTER — Telehealth: Payer: Self-pay | Admitting: Internal Medicine

## 2013-06-21 NOTE — Telephone Encounter (Signed)
Relevant patient education assigned to patient using Emmi. ° °

## 2013-07-01 ENCOUNTER — Encounter (HOSPITAL_BASED_OUTPATIENT_CLINIC_OR_DEPARTMENT_OTHER): Payer: BC Managed Care – PPO

## 2013-07-12 ENCOUNTER — Ambulatory Visit (INDEPENDENT_AMBULATORY_CARE_PROVIDER_SITE_OTHER): Payer: BC Managed Care – PPO | Admitting: Pulmonary Disease

## 2013-07-12 ENCOUNTER — Encounter: Payer: Self-pay | Admitting: Pulmonary Disease

## 2013-07-12 VITALS — BP 116/78 | HR 99 | Temp 98.1°F | Ht 68.0 in | Wt 163.2 lb

## 2013-07-12 DIAGNOSIS — G4733 Obstructive sleep apnea (adult) (pediatric): Secondary | ICD-10-CM

## 2013-07-12 NOTE — Assessment & Plan Note (Signed)
The patient has mild to moderate sleep apnea by his recent sleep study, and does have some symptoms which are impacting his quality of life. I have outlined a conservative approach with a trial of weight loss alone, versus more aggressive treatment with a dental appliance or CPAP. After a long discussion of the options, and as well as the positives and negatives, the patient would like to consider a dental appliance. I will refer him to the appropriate dentist, and I have also encouraged him to work aggressively on modest weight loss.

## 2013-07-12 NOTE — Patient Instructions (Signed)
Will refer to Dr. Ron Parker for consideration of a dental appliance. Work on modest weight loss.

## 2013-07-12 NOTE — Progress Notes (Signed)
Subjective:    Patient ID: Caleb Obrien, male    DOB: 08-08-1965, 48 y.o.   MRN: [id number]  HPI The patient is a 48 year old male who I've been asked to see for obstructive sleep apnea. He has had a recent sleep study which showed an AHI of 21 events per hour, and optimal CPAP pressure around 6 cm of water. The patient has been noted to have loud snoring, and has also experienced occasional choking arousals. He has not had a stable bed partner who can comment on apneas. He thinks he awakens about 3-4 times a night, and usually does not feel completely rested in the mornings. He describes intermittent sleep pressure during the day with inactivity, primarily in the afternoons. He has no issues watching television at home, but will fall asleep easily at a movie theater. He has no issues with sleepiness driving longer distances except on occasion. The patient's weight is up about 10 pounds over the last 2 years, and his Epworth score today is 10.   Sleep Questionnaire What time do you typically go to bed?( Between what hours) 11pm 11pm at 1203 on 07/12/13 by Virl Cagey, CMA How long does it take you to fall asleep? 17min 9min at 1203 on 07/12/13 by Virl Cagey, CMA How many times during the night do you wake up? 3 3 at 1203 on 07/12/13 by Virl Cagey, CMA What time do you get out of bed to start your day? 0700 0700 at 1203 on 07/12/13 by Virl Cagey, CMA Do you drive or operate heavy machinery in your occupation? No No at 1203 on 07/12/13 by Virl Cagey, CMA How much has your weight changed (up or down) over the past two years? (In pounds) 10 lb (4.536 kg) 10 lb (4.536 kg) at 1203 on 07/12/13 by Virl Cagey, CMA Have you ever had a sleep study before? Yes Yes at 1203 on 07/12/13 by Virl Cagey, CMA If yes, location of study? [facility] at 1203 on 07/12/13 by Virl Cagey, CMA If yes, date of study? 05/2013 05/2013 at 1203 on 07/12/13 by Virl Cagey, CMA Do you currently use CPAP? No No at 1203 on 07/12/13 by Virl Cagey, CMA Do you wear oxygen at any time? No No at 1203 on 07/12/13 by Virl Cagey, CMA   Review of Systems  Constitutional: Negative for fever and unexpected weight change.  HENT: Positive for congestion. Negative for dental problem, ear pain, nosebleeds, postnasal drip, rhinorrhea, sinus pressure, sneezing, sore throat and trouble swallowing.   Eyes: Negative for redness and itching.  Respiratory: Negative for cough, chest tightness, shortness of breath and wheezing.   Cardiovascular: Negative for palpitations and leg swelling.  Gastrointestinal: Negative for nausea and vomiting.  Genitourinary: Negative for dysuria.  Musculoskeletal: Negative for joint swelling.  Skin: Negative for rash.  Neurological: Negative for headaches.  Hematological: Does not bruise/bleed easily.  Psychiatric/Behavioral: Negative for dysphoric mood. The patient is not nervous/anxious.        Objective:   Physical Exam Constitutional:  Well developed, no acute distress  HENT:  Nares patent without discharge, but deviated septum to left with significant narrowing.   Oropharynx without exudate, palate and uvula are mildly elongated  Eyes:  Perrla, eomi, no scleral icterus  Neck:  No JVD, no TMG  Cardiovascular:  Normal rate, regular rhythm, no rubs or gallops.  No murmurs        Intact distal  pulses  Pulmonary :  Normal breath sounds, no stridor or respiratory distress   No rales, rhonchi, or wheezing  Abdominal:  Soft, nondistended, bowel sounds present.  No tenderness noted.   Musculoskeletal:  No lower extremity edema noted.  Lymph Nodes:  No cervical lymphadenopathy noted  Skin:  No cyanosis noted  Neurologic:  Alert, appropriate, moves all 4 extremities without obvious deficit.         Assessment & Plan:

## 2013-08-05 ENCOUNTER — Encounter: Payer: Self-pay | Admitting: Internal Medicine

## 2013-08-05 ENCOUNTER — Encounter: Payer: Self-pay | Admitting: Family Medicine

## 2013-08-07 ENCOUNTER — Ambulatory Visit (INDEPENDENT_AMBULATORY_CARE_PROVIDER_SITE_OTHER): Payer: BC Managed Care – PPO | Admitting: Internal Medicine

## 2013-08-07 ENCOUNTER — Encounter: Payer: Self-pay | Admitting: Internal Medicine

## 2013-08-07 VITALS — BP 130/80 | HR 76 | Temp 98.2°F | Resp 16 | Ht 68.0 in | Wt 166.0 lb

## 2013-08-07 DIAGNOSIS — I10 Essential (primary) hypertension: Secondary | ICD-10-CM

## 2013-08-07 DIAGNOSIS — G4733 Obstructive sleep apnea (adult) (pediatric): Secondary | ICD-10-CM

## 2013-08-07 DIAGNOSIS — L723 Sebaceous cyst: Secondary | ICD-10-CM

## 2013-08-07 NOTE — Progress Notes (Signed)
Pre visit review using our clinic review tool, if applicable. No additional management support is needed unless otherwise documented below in the visit note. 

## 2013-08-07 NOTE — Patient Instructions (Signed)
The patient is instructed to continue all medications as prescribed. Schedule followup with check out clerk upon leaving the clinic  

## 2013-08-07 NOTE — Progress Notes (Signed)
Subjective:    Patient ID: Caleb Obrien, male    DOB: 1965-08-05, 48 y.o.   MRN: [id number]  Hyperlipidemia Pertinent negatives include no shortness of breath.  Asthma There is no cough, shortness of breath or wheezing. Associated symptoms include headaches. Pertinent negatives include no fever or postnasal drip. His past medical history is significant for asthma.   Sleep study was suggested for daytime somnolence and snoring But [employer] has delayed for prior authorization. The patient had the sleep study  That showed  moderate sleep apnea. Discussion of the most likely intervention  to provide immediate benefit.  Dental appliance chosen. Dr Gwenette Greet has recommended a follow up sleep study once he gets the appliance.  Obviously the sleep study was clinically warranted and I hope that [employer] will grant his appeal!    Review of Systems  Constitutional: Positive for fatigue. Negative for fever.       Day time somnolence  HENT: Negative for congestion, hearing loss and postnasal drip.        Snoring   Eyes: Negative for discharge, redness and visual disturbance.  Respiratory: Negative for cough, shortness of breath and wheezing.   Cardiovascular: Negative for leg swelling.  Gastrointestinal: Negative for abdominal pain, constipation and abdominal distention.  Genitourinary: Negative for urgency and frequency.  Musculoskeletal: Negative for arthralgias, joint swelling and neck pain.  Skin: Negative for color change and rash.  Neurological: Positive for headaches. Negative for weakness and light-headedness.       Decreased concentration  Hematological: Negative for adenopathy.  Psychiatric/Behavioral: Negative for behavioral problems.   Past Medical History  Diagnosis Date  . PLANTAR WART, RIGHT 01/23/2009  . Dermatophytosis of foot 11/02/2009  . Hemangioma of skin and subcutaneous tissue 06/30/2009  . [place], [place] 04/21/2008  . ALLERGIC RHINITIS CAUSE UNSPECIFIED  02/24/2009  . ASTHMA 02/05/2007  . BENIGN PROSTATIC HYPERTROPHY, WITH URINARY OBSTRUCTION 05/22/2007  . GANGLION CYST, WRIST, LEFT 03/25/2009  . HEPATITIS B, HX OF 02/05/2007    History   Social History  . Marital Status: Single    Spouse Name: N/A    Number of Children: N/A  . Years of Education: N/A   Occupational History  . HR    Social History Main Topics  . Smoking status: Former Smoker -- 0.30 packs/day    Types: Cigarettes    Quit date: 03/14/2011  . Smokeless tobacco: Never Used  . Alcohol Use: 1.5 oz/week    3 drink(s) per week     Comment: 2 beverages daily  . Drug Use: No  . Sexual Activity: Yes   Other Topics Concern  . Not on file   Social History Narrative  . No narrative on file    Past Surgical History  Procedure Laterality Date  . Wisdom tooth extraction      Family History  Problem Relation Age of Onset  . Hypertension Mother   . Stroke Mother   . Arthritis Mother   . Prostate cancer Father     No Known Allergies  Current Outpatient Prescriptions on File Prior to Visit  Medication Sig Dispense Refill  . ALPRAZolam (XANAX) 0.25 MG tablet TAKE 1/2 TABLET BY MOUTH AS NEEDED  30 tablet  2  . hydrocortisone-pramoxine (ANALPRAM-HC) 2.5-1 % rectal cream Place rectally 3 (three) times daily.  30 g  2  . ketoconazole (NIZORAL) 2 % shampoo Apply 1 application topically 2 (two) times a week.  120 mL  1  . lisinopril-hydrochlorothiazide (PRINZIDE,ZESTORETIC) 10-12.5 MG per tablet  Take 1 tablet by mouth daily.  30 tablet  3   No current facility-administered medications on file prior to visit.    BP 130/80  Pulse 76  Temp(Src) 98.2 F (36.8 C)  Resp 16  Ht 5\' 8"  (1.727 m)  Wt 166 lb (75.297 kg)  BMI 25.25 kg/m2       Obje.bas ctive:   Physical Exam  Nursing note and vitals reviewed. Constitutional: He is oriented to person, place, and time. He appears well-developed.  HENT:  Head: Normocephalic and atraumatic.  Septal deviation and  nasal passages are narrowed  Eyes: Pupils are equal, round, and reactive to light.  Cardiovascular: Normal rate.   Pulmonary/Chest: Effort normal and breath sounds normal.  Neurological: He is alert and oriented to person, place, and time.          Assessment & Plan:  Patient is a 48 year old male with excessive daytime somnolence unexplained fatigue and headaches with decreased concentration underwent a sleep study showing moderate sleep apnea.  Options were discussed with the pulmonary consultant and he has decided to try a conservative approach of a dental appliance for follow-up sleep study to ensure that this is adequately correcting the sleep apnea.  He realizes that he may require a CPAP but wishes to try the conservative approach of weight loss and dental appliance at this time.  The patient is an appropriate and cost-effective approach to factor associated with his hypertension and family history put him at significant risk for long-term complications if the apnea is not treated effectively.

## 2013-08-09 ENCOUNTER — Telehealth: Payer: Self-pay | Admitting: Internal Medicine

## 2013-08-09 NOTE — Telephone Encounter (Signed)
Relevant patient education assigned to patient using Emmi. ° °

## 2013-08-19 ENCOUNTER — Other Ambulatory Visit: Payer: Self-pay | Admitting: Family

## 2013-08-19 ENCOUNTER — Encounter: Payer: Self-pay | Admitting: Family

## 2013-08-19 MED ORDER — LISINOPRIL 10 MG PO TABS: 10.0000 mg | ORAL_TABLET | Freq: Every day | ORAL | Status: DC

## 2013-09-05 ENCOUNTER — Encounter: Payer: Self-pay | Admitting: Internal Medicine

## 2013-10-08 ENCOUNTER — Telehealth: Payer: Self-pay | Admitting: Internal Medicine

## 2013-10-08 NOTE — Telephone Encounter (Signed)
Pt is calling and would like md to call him back concerning a claim appeal. Pt stated he has been email md

## 2013-10-10 NOTE — Telephone Encounter (Signed)
Per Derinda Late we need EOB statement from pts insurance in order to file an appeal.  Asked pt to fax it to [place] and provided fax number via mychart

## 2013-11-26 ENCOUNTER — Encounter: Payer: Self-pay | Admitting: Family

## 2013-11-29 ENCOUNTER — Encounter: Payer: Self-pay | Admitting: Family

## 2013-11-29 ENCOUNTER — Ambulatory Visit (INDEPENDENT_AMBULATORY_CARE_PROVIDER_SITE_OTHER): Payer: BC Managed Care – PPO | Admitting: Family

## 2013-11-29 VITALS — BP 110/60 | HR 108 | Wt 166.0 lb

## 2013-11-29 DIAGNOSIS — R05 Cough: Secondary | ICD-10-CM

## 2013-11-29 DIAGNOSIS — R059 Cough, unspecified: Secondary | ICD-10-CM

## 2013-11-29 DIAGNOSIS — I10 Essential (primary) hypertension: Secondary | ICD-10-CM

## 2013-11-29 MED ORDER — AMLODIPINE BESYLATE 5 MG PO TABS: 5.0000 mg | ORAL_TABLET | Freq: Every day | ORAL | Status: DC

## 2013-11-29 NOTE — Progress Notes (Signed)
Subjective:    Patient ID: Caleb Obrien, male    DOB: Jan 30, 1966, 48 y.o.   MRN: [id number]  HPI  48 yo Caucasian male, rare smoker, presents for acute issue of dry cough. Onset of cough was in December with lisinopril prescription. He describes a tickle in throat, which would resolve with water. Since that time, the tickle has progressed to a cough when speaking now.  Specifically denies airway compromise, throat or lip swelling.   Review of Systems  Constitutional: Negative.   HENT: Negative.   Eyes: Negative.   Respiratory: Positive for cough.        Non-productive cough  Cardiovascular: Negative.   Gastrointestinal: Negative.   Endocrine: Negative.   Musculoskeletal: Negative.   Skin: Negative.   Allergic/Immunologic: Negative.   Neurological: Negative.   Hematological: Negative.   Psychiatric/Behavioral: Negative.    Past Medical History  Diagnosis Date  . PLANTAR WART, RIGHT 01/23/2009  . Dermatophytosis of foot 11/02/2009  . Hemangioma of skin and subcutaneous tissue 06/30/2009  . [place], [place] 04/21/2008  . ALLERGIC RHINITIS CAUSE UNSPECIFIED 02/24/2009  . ASTHMA 02/05/2007  . BENIGN PROSTATIC HYPERTROPHY, WITH URINARY OBSTRUCTION 05/22/2007  . GANGLION CYST, WRIST, LEFT 03/25/2009  . HEPATITIS B, HX OF 02/05/2007    History   Social History  . Marital Status: Single    Spouse Name: N/A    Number of Children: N/A  . Years of Education: N/A   Occupational History  . HR    Social History Main Topics  . Smoking status: Former Smoker -- 0.30 packs/day    Types: Cigarettes    Quit date: 03/14/2011  . Smokeless tobacco: Never Used  . Alcohol Use: 1.5 oz/week    3 drink(s) per week     Comment: 2 beverages daily  . Drug Use: No  . Sexual Activity: Yes   Other Topics Concern  . Not on file   Social History Narrative  . No narrative on file    Past Surgical History  Procedure Laterality Date  . Wisdom tooth extraction      Family  History  Problem Relation Age of Onset  . Hypertension Mother   . Stroke Mother   . Arthritis Mother   . Prostate cancer Father     No Known Allergies  Current Outpatient Prescriptions on File Prior to Visit  Medication Sig Dispense Refill  . ALPRAZolam (XANAX) 0.25 MG tablet TAKE 1/2 TABLET BY MOUTH AS NEEDED  30 tablet  2  . hydrocortisone-pramoxine (ANALPRAM-HC) 2.5-1 % rectal cream Place rectally 3 (three) times daily.  30 g  2  . ketoconazole (NIZORAL) 2 % shampoo Apply 1 application topically 2 (two) times a week.  120 mL  1   No current facility-administered medications on file prior to visit.    BP 110/60  Pulse 108  Wt 166 lb (75.297 kg)chart    Objective:   Physical Exam  Constitutional: He is oriented to person, place, and time. He appears well-developed and well-nourished.  HENT:  Head: Normocephalic and atraumatic.  Nose: Nose normal.  Mouth/Throat: Oropharynx is clear and moist. No oropharyngeal exudate.  Eyes: Conjunctivae and EOM are normal. Pupils are equal, round, and reactive to light.  Neck: Normal range of motion.  Cardiovascular: Normal rate, regular rhythm and normal heart sounds.   Pulmonary/Chest: Effort normal and breath sounds normal. No stridor. No respiratory distress. He has no wheezes. He has no rales.  Musculoskeletal: He exhibits no edema.  Neurological: He is  alert and oriented to person, place, and time.  Skin: Skin is warm and dry.  Psychiatric: He has a normal mood and affect. His behavior is normal. Judgment and thought content normal.      Assessment & Plan:  Caleb Obrien was seen today for medication problem.  Diagnoses and associated orders for this visit:  Unspecified essential hypertension - Discontinue: amLODipine (NORVASC) 5 MG tablet; Take 1 tablet (5 mg total) by mouth daily.  Cough  Other Orders - amLODipine (NORVASC) 5 MG tablet; Take 1 tablet (5 mg total) by mouth daily.   Call the office with any questions and concerns.  Recheck in 3 weeks and sooner as needed.

## 2013-11-29 NOTE — Patient Instructions (Signed)
Hypertension Hypertension, commonly called high blood pressure, is when the force of blood pumping through your arteries is too strong. Your arteries are the blood vessels that carry blood from your heart throughout your body. A blood pressure reading consists of a higher number over a lower number, such as 110/72. The higher number (systolic) is the pressure inside your arteries when your heart pumps. The lower number (diastolic) is the pressure inside your arteries when your heart relaxes. Ideally you want your blood pressure below 120/80. Hypertension forces your heart to work harder to pump blood. Your arteries may become narrow or stiff. Having hypertension puts you at risk for heart disease, stroke, and other problems.  RISK FACTORS Some risk factors for high blood pressure are controllable. Others are not.  Risk factors you cannot control include:   Race. You may be at higher risk if you are African American.  Age. Risk increases with age.  Gender. Men are at higher risk than women before age 45 years. After age 65, women are at higher risk than men. Risk factors you can control include:  Not getting enough exercise or physical activity.  Being overweight.  Getting too much fat, sugar, calories, or salt in your diet.  Drinking too much alcohol. SIGNS AND SYMPTOMS Hypertension does not usually cause signs or symptoms. Extremely high blood pressure (hypertensive crisis) may cause headache, anxiety, shortness of breath, and nosebleed. DIAGNOSIS  To check if you have hypertension, your health care provider will measure your blood pressure while you are seated, with your arm held at the level of your heart. It should be measured at least twice using the same arm. Certain conditions can cause a difference in blood pressure between your right and left arms. A blood pressure reading that is higher than normal on one occasion does not mean that you need treatment. If one blood pressure reading  is high, ask your health care provider about having it checked again. TREATMENT  Treating high blood pressure includes making lifestyle changes and possibly taking medication. Living a healthy lifestyle can help lower high blood pressure. You may need to change some of your habits. Lifestyle changes may include:  Following the DASH diet. This diet is high in fruits, vegetables, and whole grains. It is low in salt, red meat, and added sugars.  Getting at least 2 1/2 hours of brisk physical activity every week.  Losing weight if necessary.  Not smoking.  Limiting alcoholic beverages.  Learning ways to reduce stress. If lifestyle changes are not enough to get your blood pressure under control, your health care provider may prescribe medicine. You may need to take more than one. Work closely with your health care provider to understand the risks and benefits. HOME CARE INSTRUCTIONS  Have your blood pressure rechecked as directed by your health care provider.   Only take medicine as directed by your health care provider. Follow the directions carefully. Blood pressure medicines must be taken as prescribed. The medicine does not work as well when you skip doses. Skipping doses also puts you at risk for problems.   Do not smoke.   Monitor your blood pressure at home as directed by your health care provider. SEEK MEDICAL CARE IF:   You think you are having a reaction to medicines taken.  You have recurrent headaches or feel dizzy.  You have swelling in your ankles.  You have trouble with your vision. SEEK IMMEDIATE MEDICAL CARE IF:  You develop a severe headache or   confusion.  You have unusual weakness, numbness, or feel faint.  You have severe chest or abdominal pain.  You vomit repeatedly.  You have trouble breathing. MAKE SURE YOU:   Understand these instructions.  Will watch your condition.  Will get help right away if you are not doing well or get  worse. Document Released: 05/30/2005 Document Revised: 06/04/2013 Document Reviewed: 03/22/2013 Exit[employer] Patient Information 2015 [employer]. This information is not intended to replace advice given to you by your health care provider. Make sure you discuss any questions you have with your health care provider.  

## 2013-11-29 NOTE — Progress Notes (Signed)
Pre visit review using our clinic review tool, if applicable. No additional management support is needed unless otherwise documented below in the visit note. 

## 2013-11-30 ENCOUNTER — Encounter: Payer: Self-pay | Admitting: Family

## 2013-12-02 ENCOUNTER — Other Ambulatory Visit: Payer: Self-pay

## 2013-12-02 MED ORDER — LEVALBUTEROL TARTRATE 45 MCG/ACT IN AERO: 1.0000 | INHALATION_SPRAY | RESPIRATORY_TRACT | Status: DC | PRN

## 2013-12-20 ENCOUNTER — Encounter: Payer: Self-pay | Admitting: Family

## 2013-12-20 ENCOUNTER — Ambulatory Visit (INDEPENDENT_AMBULATORY_CARE_PROVIDER_SITE_OTHER): Payer: BC Managed Care – PPO | Admitting: Family

## 2013-12-20 ENCOUNTER — Telehealth: Payer: Self-pay | Admitting: Internal Medicine

## 2013-12-20 VITALS — BP 146/92 | HR 97 | Wt 166.0 lb

## 2013-12-20 DIAGNOSIS — I10 Essential (primary) hypertension: Secondary | ICD-10-CM

## 2013-12-20 DIAGNOSIS — R12 Heartburn: Secondary | ICD-10-CM

## 2013-12-20 DIAGNOSIS — Z206 Contact with and (suspected) exposure to human immunodeficiency virus [HIV]: Secondary | ICD-10-CM

## 2013-12-20 DIAGNOSIS — K649 Unspecified hemorrhoids: Secondary | ICD-10-CM

## 2013-12-20 DIAGNOSIS — R05 Cough: Secondary | ICD-10-CM

## 2013-12-20 DIAGNOSIS — Z8042 Family history of malignant neoplasm of prostate: Secondary | ICD-10-CM

## 2013-12-20 DIAGNOSIS — R059 Cough, unspecified: Secondary | ICD-10-CM

## 2013-12-20 MED ORDER — RANITIDINE HCL 75 MG PO TABS: 150.0000 mg | ORAL_TABLET | Freq: Every day | ORAL | Status: DC

## 2013-12-20 MED ORDER — LISINOPRIL 10 MG PO TABS: 10.0000 mg | ORAL_TABLET | Freq: Every day | ORAL | Status: DC

## 2013-12-20 MED ORDER — HYDROCORTISONE ACETATE 25 MG RE SUPP: 25.0000 mg | Freq: Two times a day (BID) | RECTAL | Status: DC

## 2013-12-20 NOTE — Telephone Encounter (Signed)
Yes we can  Please order

## 2013-12-20 NOTE — Telephone Encounter (Signed)
Future orders placed, pt aware to go to [facility] for labs

## 2013-12-20 NOTE — Progress Notes (Signed)
Pre visit review using our clinic review tool, if applicable. No additional management support is needed unless otherwise documented below in the visit note. 

## 2013-12-20 NOTE — Patient Instructions (Signed)
Gastroesophageal Reflux Disease, Adult Gastroesophageal reflux disease (GERD) happens when acid from your stomach flows up into the esophagus. When acid comes in contact with the esophagus, the acid causes soreness (inflammation) in the esophagus. Over time, GERD may create small holes (ulcers) in the lining of the esophagus. CAUSES   Increased body weight. This puts pressure on the stomach, making acid rise from the stomach into the esophagus.  Smoking. This increases acid production in the stomach.  Drinking alcohol. This causes decreased pressure in the lower esophageal sphincter (valve or ring of muscle between the esophagus and stomach), allowing acid from the stomach into the esophagus.  Late evening meals and a full stomach. This increases pressure and acid production in the stomach.  A malformed lower esophageal sphincter. Sometimes, no cause is found. SYMPTOMS   Burning pain in the lower part of the mid-chest behind the breastbone and in the mid-stomach area. This may occur twice a week or more often.  Trouble swallowing.  Sore throat.  Dry cough.  Asthma-like symptoms including chest tightness, shortness of breath, or wheezing. DIAGNOSIS  Your caregiver may be able to diagnose GERD based on your symptoms. In some cases, X-rays and other tests may be done to check for complications or to check the condition of your stomach and esophagus. TREATMENT  Your caregiver may recommend over-the-counter or prescription medicines to help decrease acid production. Ask your caregiver before starting or adding any new medicines.  HOME CARE INSTRUCTIONS   Change the factors that you can control. Ask your caregiver for guidance concerning weight loss, quitting smoking, and alcohol consumption.  Avoid foods and drinks that make your symptoms worse, such as:  Caffeine or alcoholic drinks.  Chocolate.  Peppermint or mint flavorings.  Garlic and onions.  Spicy foods.  Citrus fruits,  such as oranges, lemons, or limes.  Tomato-based foods such as sauce, chili, salsa, and pizza.  Fried and fatty foods.  Avoid lying down for the 3 hours prior to your bedtime or prior to taking a nap.  Eat small, frequent meals instead of large meals.  Wear loose-fitting clothing. Do not wear anything tight around your waist that causes pressure on your stomach.  Raise the head of your bed 6 to 8 inches with wood blocks to help you sleep. Extra pillows will not help.  Only take over-the-counter or prescription medicines for pain, discomfort, or fever as directed by your caregiver.  Do not take aspirin, ibuprofen, or other nonsteroidal anti-inflammatory drugs (NSAIDs). SEEK IMMEDIATE MEDICAL CARE IF:   You have pain in your arms, neck, jaw, teeth, or back.  Your pain increases or changes in intensity or duration.  You develop nausea, vomiting, or sweating (diaphoresis).  You develop shortness of breath, or you faint.  Your vomit is green, yellow, black, or looks like coffee grounds or blood.  Your stool is red, bloody, or black. These symptoms could be signs of other problems, such as heart disease, gastric bleeding, or esophageal bleeding. MAKE SURE YOU:   Understand these instructions.  Will watch your condition.  Will get help right away if you are not doing well or get worse. Document Released: 03/09/2005 Document Revised: 08/22/2011 Document Reviewed: 12/17/2010 Exit[employer] Patient Information 2015 [employer]. This information is not intended to replace advice given to you by your health care provider. Make sure you discuss any questions you have with your health care provider.  

## 2013-12-20 NOTE — Telephone Encounter (Addendum)
Pt had psa,HIV// labs ordered, and would still like to get these done prior to his appt.  but needs  to go to [facility] since he'll be seeing dr Ronnald Ramp.  Can you put the order in?

## 2013-12-20 NOTE — Progress Notes (Signed)
Subjective:    Patient ID: Caleb Obrien, male    DOB: Apr 04, 1966, 48 y.o.   MRN: [id number]  HPI  48 year old rare smoker Caucasian male presents with continued cough. He was seen on 11/29/13 for same symptoms and lisinopril was discontinued. Since that time, his cough persists. He reports the cough is not bothersome at night, is non-productive. He denies wheezing, dyspnea or paroxsymal nocturnal dyspnea.  He reports 1-2 bouts of heartburn per week, symptoms relieved by Tums/Rolaids.    Review of Systems  Constitutional: Negative.   HENT: Negative.   Eyes: Negative.   Respiratory: Positive for cough. Negative for shortness of breath, wheezing and stridor.   Cardiovascular: Negative.   Gastrointestinal:       Heart burn Hemorrhoids   Endocrine: Negative.   Genitourinary: Negative.   Musculoskeletal: Negative.   Skin: Negative.   Allergic/Immunologic: Negative.   Neurological: Negative.   Hematological: Negative.   Psychiatric/Behavioral: Negative.    Past Medical History  Diagnosis Date  . PLANTAR WART, RIGHT 01/23/2009  . Dermatophytosis of foot 11/02/2009  . Hemangioma of skin and subcutaneous tissue 06/30/2009  . [place], [place] 04/21/2008  . ALLERGIC RHINITIS CAUSE UNSPECIFIED 02/24/2009  . ASTHMA 02/05/2007  . BENIGN PROSTATIC HYPERTROPHY, WITH URINARY OBSTRUCTION 05/22/2007  . GANGLION CYST, WRIST, LEFT 03/25/2009  . HEPATITIS B, HX OF 02/05/2007    History   Social History  . Marital Status: Single    Spouse Name: N/A    Number of Children: N/A  . Years of Education: N/A   Occupational History  . HR    Social History Main Topics  . Smoking status: Former Smoker -- 0.30 packs/day    Types: Cigarettes    Quit date: 03/14/2011  . Smokeless tobacco: Never Used  . Alcohol Use: 1.5 oz/week    3 drink(s) per week     Comment: 2 beverages daily  . Drug Use: No  . Sexual Activity: Yes   Other Topics Concern  . Not on file   Social History  Narrative  . No narrative on file    Past Surgical History  Procedure Laterality Date  . Wisdom tooth extraction      Family History  Problem Relation Age of Onset  . Hypertension Mother   . Stroke Mother   . Arthritis Mother   . Prostate cancer Father     No Known Allergies  Current Outpatient Prescriptions on File Prior to Visit  Medication Sig Dispense Refill  . ALPRAZolam (XANAX) 0.25 MG tablet TAKE 1/2 TABLET BY MOUTH AS NEEDED  30 tablet  2  . hydrocortisone-pramoxine (ANALPRAM-HC) 2.5-1 % rectal cream Place rectally 3 (three) times daily.  30 g  2  . ketoconazole (NIZORAL) 2 % shampoo Apply 1 application topically 2 (two) times a week.  120 mL  1  . levalbuterol (XOPENEX HFA) 45 MCG/ACT inhaler Inhale 1-2 puffs into the lungs every 4 (four) hours as needed for wheezing.  1 Inhaler  3   No current facility-administered medications on file prior to visit.    BP 146/92  Pulse 97  Wt 166 lb (75.297 kg)chart      Objective:   Physical Exam  Constitutional: He is oriented to person, place, and time. He appears well-developed and well-nourished. No distress.  HENT:  Head: Normocephalic and atraumatic.  Eyes: EOM are normal. Pupils are equal, round, and reactive to light. No scleral icterus.  Neck: Normal range of motion.  Cardiovascular: Normal rate, regular  rhythm and normal heart sounds.   No murmur heard. Pulmonary/Chest: Effort normal and breath sounds normal. No respiratory distress. He has no wheezes. He has no rales. He exhibits no tenderness.  Abdominal: Soft.  Musculoskeletal: Normal range of motion.  Neurological: He is alert and oriented to person, place, and time.  Skin: Skin is warm and dry. No rash noted. He is not diaphoretic. No erythema.  Psychiatric: He has a normal mood and affect. His behavior is normal. Judgment and thought content normal.      Assessment & Plan:  Andrea was seen today for follow-up.  Diagnoses and associated orders for this  visit:  Cough - ranitidine (ZANTAC) 75 MG tablet; Take 2 tablets (150 mg total) by mouth at bedtime.  Heartburn - ranitidine (ZANTAC) 75 MG tablet; Take 2 tablets (150 mg total) by mouth at bedtime.  Unspecified essential hypertension - lisinopril (PRINIVIL,ZESTRIL) 10 MG tablet; Take 1 tablet (10 mg total) by mouth daily.  Hemorrhoids, unspecified hemorrhoid type - hydrocortisone (ANUSOL-HC) 25 MG suppository; Place 1 suppository (25 mg total) rectally 2 (two) times daily.    RTC in 3 weeks or sooner if symptoms worsen. If cough persists at next visit, consider CXR?

## 2013-12-26 ENCOUNTER — Other Ambulatory Visit: Payer: Self-pay

## 2013-12-26 ENCOUNTER — Encounter: Payer: Self-pay | Admitting: Family

## 2013-12-26 MED ORDER — MONTELUKAST SODIUM 10 MG PO TABS: 10.0000 mg | ORAL_TABLET | Freq: Every day | ORAL | Status: DC

## 2014-01-08 ENCOUNTER — Other Ambulatory Visit: Payer: Self-pay | Admitting: *Deleted

## 2014-01-08 ENCOUNTER — Encounter: Payer: Self-pay | Admitting: Family

## 2014-01-08 ENCOUNTER — Other Ambulatory Visit: Payer: Self-pay | Admitting: Family

## 2014-01-08 MED ORDER — ALPRAZOLAM 0.25 MG PO TABS: ORAL_TABLET | ORAL | Status: DC

## 2014-01-08 NOTE — Telephone Encounter (Signed)
Rx for Alprazolam 0.25mg  was printed and I called this in to [facility] at [phone].

## 2014-01-09 ENCOUNTER — Ambulatory Visit: Payer: BC Managed Care – PPO | Admitting: Family

## 2014-01-10 ENCOUNTER — Ambulatory Visit: Payer: BC Managed Care – PPO | Admitting: Family

## 2014-01-24 ENCOUNTER — Encounter: Payer: Self-pay | Admitting: Family

## 2014-01-24 ENCOUNTER — Ambulatory Visit (INDEPENDENT_AMBULATORY_CARE_PROVIDER_SITE_OTHER)
Admission: RE | Admit: 2014-01-24 | Discharge: 2014-01-24 | Disposition: A | Discharge status: Home / Self Care | Payer: BC Managed Care – PPO | Source: Ambulatory Visit | Attending: Family | Admitting: Family

## 2014-01-24 ENCOUNTER — Ambulatory Visit (INDEPENDENT_AMBULATORY_CARE_PROVIDER_SITE_OTHER): Payer: BC Managed Care – PPO | Admitting: Family

## 2014-01-24 ENCOUNTER — Other Ambulatory Visit: Payer: Self-pay | Admitting: Family

## 2014-01-24 VITALS — BP 118/82 | HR 80 | Temp 98.8°F | Ht 68.0 in | Wt 161.0 lb

## 2014-01-24 DIAGNOSIS — J45909 Unspecified asthma, uncomplicated: Secondary | ICD-10-CM

## 2014-01-24 DIAGNOSIS — F3289 Other specified depressive episodes: Secondary | ICD-10-CM

## 2014-01-24 DIAGNOSIS — R059 Cough, unspecified: Secondary | ICD-10-CM

## 2014-01-24 DIAGNOSIS — E785 Hyperlipidemia, unspecified: Secondary | ICD-10-CM

## 2014-01-24 DIAGNOSIS — J4520 Mild intermittent asthma, uncomplicated: Secondary | ICD-10-CM

## 2014-01-24 DIAGNOSIS — R05 Cough: Secondary | ICD-10-CM

## 2014-01-24 DIAGNOSIS — G4733 Obstructive sleep apnea (adult) (pediatric): Secondary | ICD-10-CM

## 2014-01-24 DIAGNOSIS — F329 Major depressive disorder, single episode, unspecified: Secondary | ICD-10-CM

## 2014-01-24 DIAGNOSIS — F32A Depression, unspecified: Secondary | ICD-10-CM

## 2014-01-24 NOTE — Progress Notes (Signed)
Pre visit review using our clinic review tool, if applicable. No additional management support is needed unless otherwise documented below in the visit note. 

## 2014-01-24 NOTE — Patient Instructions (Signed)

## 2014-01-24 NOTE — Progress Notes (Signed)
Subjective:    Patient ID: Caleb Obrien, male    DOB: May 04, 1966, 48 y.o.   MRN: [id number]  HPI 48 year old white male returns today for recheck of her chronic cough. He reports starting an antihistamine and Prilosec that is helped. He reports chest tightness at times. Requesting a chest x-ray. Denies any chest pain or palpitations. Reports smoking 3 cigarettes last night.  Review of Systems  Constitutional: Negative.   HENT: Negative.   Respiratory: Positive for cough. Negative for shortness of breath and wheezing.        Improved cough  Cardiovascular: Negative.   Gastrointestinal: Negative.   Endocrine: Negative.   Genitourinary: Negative.   Musculoskeletal: Negative.   Skin: Negative.   Allergic/Immunologic: Negative.   Neurological: Negative.   Psychiatric/Behavioral: Negative.    Past Medical History  Diagnosis Date  . PLANTAR WART, RIGHT 01/23/2009  . Dermatophytosis of foot 11/02/2009  . Hemangioma of skin and subcutaneous tissue 06/30/2009  . [place], [place] 04/21/2008  . ALLERGIC RHINITIS CAUSE UNSPECIFIED 02/24/2009  . ASTHMA 02/05/2007  . BENIGN PROSTATIC HYPERTROPHY, WITH URINARY OBSTRUCTION 05/22/2007  . GANGLION CYST, WRIST, LEFT 03/25/2009  . HEPATITIS B, HX OF 02/05/2007    History   Social History  . Marital Status: Single    Spouse Name: N/A    Number of Children: N/A  . Years of Education: N/A   Occupational History  . HR    Social History Main Topics  . Smoking status: Former Smoker -- 0.30 packs/day    Types: Cigarettes    Quit date: 03/14/2011  . Smokeless tobacco: Never Used  . Alcohol Use: 1.5 oz/week    3 drink(s) per week     Comment: 2 beverages daily  . Drug Use: No  . Sexual Activity: Yes   Other Topics Concern  . Not on file   Social History Narrative  . No narrative on file    Past Surgical History  Procedure Laterality Date  . Wisdom tooth extraction      Family History  Problem Relation Age of Onset  .  Hypertension Mother   . Stroke Mother   . Arthritis Mother   . Prostate cancer Father     No Known Allergies  Current Outpatient Prescriptions on File Prior to Visit  Medication Sig Dispense Refill  . ALPRAZolam (XANAX) 0.25 MG tablet TAKE 1/2 TABLET BY MOUTH AS NEEDED  30 tablet  2  . hydrocortisone (ANUSOL-HC) 25 MG suppository Place 1 suppository (25 mg total) rectally 2 (two) times daily.  12 suppository  0  . hydrocortisone-pramoxine (ANALPRAM-HC) 2.5-1 % rectal cream Place rectally 3 (three) times daily.  30 g  2  . ketoconazole (NIZORAL) 2 % shampoo Apply 1 application topically 2 (two) times a week.  120 mL  1  . levalbuterol (XOPENEX HFA) 45 MCG/ACT inhaler Inhale 1-2 puffs into the lungs every 4 (four) hours as needed for wheezing.  1 Inhaler  3  . lisinopril (PRINIVIL,ZESTRIL) 10 MG tablet Take 1 tablet (10 mg total) by mouth daily.  90 tablet  3  . ranitidine (ZANTAC) 75 MG tablet Take 2 tablets (150 mg total) by mouth at bedtime.  60 tablet  1   No current facility-administered medications on file prior to visit.    BP 118/82  Pulse 80  Temp(Src) 98.8 F (37.1 C) (Oral)  Ht 5\' 8"  (1.727 m)  Wt 161 lb (73.029 kg)  BMI 24.49 kg/m2chart    Objective:   Physical Exam  Constitutional: He is oriented to person, place, and time. He appears well-developed and well-nourished.  HENT:  Right Ear: External ear normal.  Left Ear: External ear normal.  Nose: Nose normal.  Mouth/Throat: Oropharynx is clear and moist.  Neck: Normal range of motion. Neck supple.  Cardiovascular: Normal rate, regular rhythm and normal heart sounds.   Pulmonary/Chest: Effort normal and breath sounds normal.  Abdominal: Soft. Bowel sounds are normal.  Musculoskeletal: Normal range of motion.  Neurological: He is alert and oriented to person, place, and time.  Skin: Skin is warm and dry.  Psychiatric: He has a normal mood and affect.          Assessment & Plan:  [name] was seen today for  follow-up.  Diagnoses and associated orders for this visit:  Other and unspecified hyperlipidemia  Depression  OSA (obstructive sleep apnea)  Cough - DG Chest 2 View; Future  Asthma, chronic, mild intermittent, uncomplicated - DG Chest 2 View; Future     The patient continues to complain of a chronic cough refer to pulmonology for further management. At present, cough is stable. Continue Prilosec and antihistamine. Smoking cessation. Recheck as scheduled and as needed sooner.

## 2014-01-29 ENCOUNTER — Other Ambulatory Visit: Payer: BC Managed Care – PPO

## 2014-02-05 ENCOUNTER — Encounter: Payer: BC Managed Care – PPO | Admitting: Internal Medicine

## 2014-02-07 ENCOUNTER — Ambulatory Visit (INDEPENDENT_AMBULATORY_CARE_PROVIDER_SITE_OTHER): Payer: BC Managed Care – PPO | Admitting: Internal Medicine

## 2014-02-07 ENCOUNTER — Encounter: Payer: Self-pay | Admitting: Internal Medicine

## 2014-02-07 ENCOUNTER — Other Ambulatory Visit (INDEPENDENT_AMBULATORY_CARE_PROVIDER_SITE_OTHER): Payer: BC Managed Care – PPO

## 2014-02-07 VITALS — BP 112/78 | HR 87 | Temp 99.0°F | Resp 16 | Ht 68.0 in | Wt 164.8 lb

## 2014-02-07 DIAGNOSIS — F329 Major depressive disorder, single episode, unspecified: Secondary | ICD-10-CM

## 2014-02-07 DIAGNOSIS — J45909 Unspecified asthma, uncomplicated: Secondary | ICD-10-CM

## 2014-02-07 DIAGNOSIS — Z0000 Encounter for general adult medical examination without abnormal findings: Secondary | ICD-10-CM

## 2014-02-07 DIAGNOSIS — I10 Essential (primary) hypertension: Secondary | ICD-10-CM

## 2014-02-07 DIAGNOSIS — Z23 Encounter for immunization: Secondary | ICD-10-CM

## 2014-02-07 DIAGNOSIS — Z720 Tobacco use: Secondary | ICD-10-CM

## 2014-02-07 DIAGNOSIS — N138 Other obstructive and reflux uropathy: Secondary | ICD-10-CM | POA: Insufficient documentation

## 2014-02-07 DIAGNOSIS — F3289 Other specified depressive episodes: Secondary | ICD-10-CM

## 2014-02-07 DIAGNOSIS — N401 Enlarged prostate with lower urinary tract symptoms: Secondary | ICD-10-CM | POA: Insufficient documentation

## 2014-02-07 DIAGNOSIS — J4530 Mild persistent asthma, uncomplicated: Secondary | ICD-10-CM

## 2014-02-07 LAB — CBC WITH DIFFERENTIAL/PLATELET
Basophils Absolute: 0.1 10*3/uL (ref 0.0–0.1)
Basophils Relative: 1.3 % (ref 0.0–3.0)
Eosinophils Absolute: 0.1 10*3/uL (ref 0.0–0.7)
Eosinophils Relative: 0.9 % (ref 0.0–5.0)
HCT: 44.5 % (ref 39.0–52.0)
Hemoglobin: 15.1 g/dL (ref 13.0–17.0)
Lymphocytes Relative: 22.3 % (ref 12.0–46.0)
Lymphs Abs: 1.8 10*3/uL (ref 0.7–4.0)
MCHC: 33.8 g/dL (ref 30.0–36.0)
MCV: 97.3 fl (ref 78.0–100.0)
Monocytes Absolute: 1 10*3/uL (ref 0.1–1.0)
Monocytes Relative: 11.9 % (ref 3.0–12.0)
Neutro Abs: 5.2 10*3/uL (ref 1.4–7.7)
Neutrophils Relative %: 63.6 % (ref 43.0–77.0)
Platelets: 296 10*3/uL (ref 150.0–400.0)
RBC: 4.58 Mil/uL (ref 4.22–5.81)
RDW: 13.2 % (ref 11.5–15.5)
WBC: 8.2 10*3/uL (ref 4.0–10.5)

## 2014-02-07 LAB — COMPREHENSIVE METABOLIC PANEL
ALT: 24 U/L (ref 0–53)
AST: 18 U/L (ref 0–37)
Albumin: 4.4 g/dL (ref 3.5–5.2)
Alkaline Phosphatase: 68 U/L (ref 39–117)
BUN: 11 mg/dL (ref 6–23)
CO2: 30 mEq/L (ref 19–32)
Calcium: 9.6 mg/dL (ref 8.4–10.5)
Chloride: 103 mEq/L (ref 96–112)
Creatinine, Ser: 1.1 mg/dL (ref 0.4–1.5)
GFR: 79.79 mL/min (ref >60.00)
Glucose, Bld: 85 mg/dL (ref 70–99)
Potassium: 4.3 mEq/L (ref 3.5–5.1)
Sodium: 139 mEq/L (ref 135–145)
Total Bilirubin: 0.8 mg/dL (ref 0.2–1.2)
Total Protein: 7.2 g/dL (ref 6.0–8.3)

## 2014-02-07 LAB — LIPID PANEL
Cholesterol: 197 mg/dL (ref 0–200)
HDL: 44.5 mg/dL (ref >39.00)
LDL Cholesterol: 141 mg/dL — ABNORMAL HIGH (ref 0–99)
NonHDL: 152.5
Total CHOL/HDL Ratio: 4
Triglycerides: 60 mg/dL (ref 0.0–149.0)
VLDL: 12 mg/dL (ref 0.0–40.0)

## 2014-02-07 LAB — URINALYSIS, ROUTINE W REFLEX MICROSCOPIC
Bilirubin Urine: NEGATIVE
Hgb urine dipstick: NEGATIVE
Ketones, ur: NEGATIVE
Leukocytes, UA: NEGATIVE
Nitrite: NEGATIVE
Specific Gravity, Urine: 1.01 (ref 1.000–1.030)
Total Protein, Urine: NEGATIVE
Urine Glucose: NEGATIVE
Urobilinogen, UA: 0.2 (ref 0.0–1.0)
pH: 5.5 (ref 5.0–8.0)

## 2014-02-07 LAB — PSA: PSA: 0.52 ng/mL (ref 0.10–4.00)

## 2014-02-07 LAB — TSH: TSH: 2.06 u[IU]/mL (ref 0.35–4.50)

## 2014-02-07 MED ORDER — LOSARTAN POTASSIUM 50 MG PO TABS: 50.0000 mg | ORAL_TABLET | Freq: Every day | ORAL | Status: DC

## 2014-02-07 MED ORDER — BUDESONIDE-FORMOTEROL FUMARATE 80-4.5 MCG/ACT IN AERO: 2.0000 | INHALATION_SPRAY | Freq: Two times a day (BID) | RESPIRATORY_TRACT | Status: DC

## 2014-02-07 NOTE — Patient Instructions (Signed)
Health Maintenance A healthy lifestyle and preventative care can promote health and wellness.  Maintain regular health, dental, and eye exams.  Eat a healthy diet. Foods like vegetables, fruits, whole grains, low-fat dairy products, and lean protein foods contain the nutrients you need and are low in calories. Decrease your intake of foods high in solid fats, added sugars, and salt. Get information about a proper diet from your health care provider, if necessary.  Regular physical exercise is one of the most important things you can do for your health. Most adults should get at least 150 minutes of moderate-intensity exercise (any activity that increases your heart rate and causes you to sweat) each week. In addition, most adults need muscle-strengthening exercises on 2 or more days a week.   Maintain a healthy weight. The body mass index (BMI) is a screening tool to identify possible weight problems. It provides an estimate of body fat based on height and weight. Your health care provider can find your BMI and can help you achieve or maintain a healthy weight. For males 20 years and older:  A BMI below 18.5 is considered underweight.  A BMI of 18.5 to 24.9 is normal.  A BMI of 25 to 29.9 is considered overweight.  A BMI of 30 and above is considered obese.  Maintain normal blood lipids and cholesterol by exercising and minimizing your intake of saturated fat. Eat a balanced diet with plenty of fruits and vegetables. Blood tests for lipids and cholesterol should begin at age 20 and be repeated every 5 years. If your lipid or cholesterol levels are high, you are over age 50, or you are at high risk for heart disease, you may need your cholesterol levels checked more frequently.Ongoing high lipid and cholesterol levels should be treated with medicines if diet and exercise are not working.  If you smoke, find out from your health care provider how to quit. If you do not use tobacco, do not  start.  Lung cancer screening is recommended for adults aged 55-80 years who are at high risk for developing lung cancer because of a history of smoking. A yearly low-dose CT scan of the lungs is recommended for people who have at least a 30-pack-year history of smoking and are current smokers or have quit within the past 15 years. A pack year of smoking is smoking an average of 1 pack of cigarettes a day for 1 year (for example, a 30-pack-year history of smoking could mean smoking 1 pack a day for 30 years or 2 packs a day for 15 years). Yearly screening should continue until the smoker has stopped smoking for at least 15 years. Yearly screening should be stopped for people who develop a health problem that would prevent them from having lung cancer treatment.  If you choose to drink alcohol, do not have more than 2 drinks per day. One drink is considered to be 12 oz (360 mL) of beer, 5 oz (150 mL) of wine, or 1.5 oz (45 mL) of liquor.  Avoid the use of street drugs. Do not share needles with anyone. Ask for help if you need support or instructions about stopping the use of drugs.  High blood pressure causes heart disease and increases the risk of stroke. Blood pressure should be checked at least every 1-2 years. Ongoing high blood pressure should be treated with medicines if weight loss and exercise are not effective.  If you are 45-79 years old, ask your health care provider if   you should take aspirin to prevent heart disease.  Diabetes screening involves taking a blood sample to check your fasting blood sugar level. This should be done once every 3 years after age 45 if you are at a normal weight and without risk factors for diabetes. Testing should be considered at a younger age or be carried out more frequently if you are overweight and have at least 1 risk factor for diabetes.  Colorectal cancer can be detected and often prevented. Most routine colorectal cancer screening begins at the age of 50  and continues through age 75. However, your health care provider may recommend screening at an earlier age if you have risk factors for colon cancer. On a yearly basis, your health care provider may provide home test kits to check for hidden blood in the stool. A small camera at the end of a tube may be used to directly examine the colon (sigmoidoscopy or colonoscopy) to detect the earliest forms of colorectal cancer. Talk to your health care provider about this at age 50 when routine screening begins. A direct exam of the colon should be repeated every 5-10 years through age 75, unless early forms of precancerous polyps or small growths are found.  People who are at an increased risk for hepatitis B should be screened for this virus. You are considered at high risk for hepatitis B if:  You were born in a country where hepatitis B occurs often. Talk with your health care provider about which countries are considered high risk.  Your parents were born in a high-risk country and you have not received a shot to protect against hepatitis B (hepatitis B vaccine).  You have HIV or AIDS.  You use needles to inject street drugs.  You live with, or have sex with, someone who has hepatitis B.  You are a man who has sex with other men (MSM).  You get hemodialysis treatment.  You take certain medicines for conditions like cancer, organ transplantation, and autoimmune conditions.  Hepatitis C blood testing is recommended for all people born from 1945 through 1965 and any individual with known risk factors for hepatitis C.  Healthy men should no longer receive prostate-specific antigen (PSA) blood tests as part of routine cancer screening. Talk to your health care provider about prostate cancer screening.  Testicular cancer screening is not recommended for adolescents or adult males who have no symptoms. Screening includes self-exam, a health care provider exam, and other screening tests. Consult with your  health care provider about any symptoms you have or any concerns you have about testicular cancer.  Practice safe sex. Use condoms and avoid high-risk sexual practices to reduce the spread of sexually transmitted infections (STIs).  You should be screened for STIs, including gonorrhea and chlamydia if:  You are sexually active and are younger than 24 years.  You are older than 24 years, and your health care provider tells you that you are at risk for this type of infection.  Your sexual activity has changed since you were last screened, and you are at an increased risk for chlamydia or gonorrhea. Ask your health care provider if you are at risk.  If you are at risk of being infected with HIV, it is recommended that you take a prescription medicine daily to prevent HIV infection. This is called pre-exposure prophylaxis (PrEP). You are considered at risk if:  You are a man who has sex with other men (MSM).  You are a heterosexual man who   is sexually active with multiple partners.  You take drugs by injection.  You are sexually active with a partner who has HIV.  Talk with your health care provider about whether you are at high risk of being infected with HIV. If you choose to begin PrEP, you should first be tested for HIV. You should then be tested every 3 months for as long as you are taking PrEP.  Use sunscreen. Apply sunscreen liberally and repeatedly throughout the day. You should seek shade when your shadow is shorter than you. Protect yourself by wearing long sleeves, pants, a wide-brimmed hat, and sunglasses year round whenever you are outdoors.  Tell your health care provider of new moles or changes in moles, especially if there is a change in shape or color. Also, tell your health care provider if a mole is larger than the size of a pencil eraser.  A one-time screening for abdominal aortic aneurysm (AAA) and surgical repair of large AAAs by ultrasound is recommended for men aged  65-75 years who are current or former smokers.  Stay current with your vaccines (immunizations). Document Released: 11/26/2007 Document Revised: 06/04/2013 Document Reviewed: 10/25/2010 Exit[employer] Patient Information 2015 [employer]. This information is not intended to replace advice given to you by your health care provider. Make sure you discuss any questions you have with your health care provider.  Hypertension Hypertension, commonly called high blood pressure, is when the force of blood pumping through your arteries is too strong. Your arteries are the blood vessels that carry blood from your heart throughout your body. A blood pressure reading consists of a higher number over a lower number, such as 110/72. The higher number (systolic) is the pressure inside your arteries when your heart pumps. The lower number (diastolic) is the pressure inside your arteries when your heart relaxes. Ideally you want your blood pressure below 120/80. Hypertension forces your heart to work harder to pump blood. Your arteries may become narrow or stiff. Having hypertension puts you at risk for heart disease, stroke, and other problems.  RISK FACTORS Some risk factors for high blood pressure are controllable. Others are not.  Risk factors you cannot control include:   Race. You may be at higher risk if you are African American.  Age. Risk increases with age.  Gender. Men are at higher risk than women before age 45 years. After age 65, women are at higher risk than men. Risk factors you can control include:  Not getting enough exercise or physical activity.  Being overweight.  Getting too much fat, sugar, calories, or salt in your diet.  Drinking too much alcohol. SIGNS AND SYMPTOMS Hypertension does not usually cause signs or symptoms. Extremely high blood pressure (hypertensive crisis) may cause headache, anxiety, shortness of breath, and nosebleed. DIAGNOSIS  To check if you have hypertension,  your health care provider will measure your blood pressure while you are seated, with your arm held at the level of your heart. It should be measured at least twice using the same arm. Certain conditions can cause a difference in blood pressure between your right and left arms. A blood pressure reading that is higher than normal on one occasion does not mean that you need treatment. If one blood pressure reading is high, ask your health care provider about having it checked again. TREATMENT  Treating high blood pressure includes making lifestyle changes and possibly taking medicine. Living a healthy lifestyle can help lower high blood pressure. You may need to change   some of your habits. Lifestyle changes may include:  Following the DASH diet. This diet is high in fruits, vegetables, and whole grains. It is low in salt, red meat, and added sugars.  Getting at least 2 hours of brisk physical activity every week.  Losing weight if necessary.  Not smoking.  Limiting alcoholic beverages.  Learning ways to reduce stress. If lifestyle changes are not enough to get your blood pressure under control, your health care provider may prescribe medicine. You may need to take more than one. Work closely with your health care provider to understand the risks and benefits. HOME CARE INSTRUCTIONS  Have your blood pressure rechecked as directed by your health care provider.   Take medicines only as directed by your health care provider. Follow the directions carefully. Blood pressure medicines must be taken as prescribed. The medicine does not work as well when you skip doses. Skipping doses also puts you at risk for problems.   Do not smoke.   Monitor your blood pressure at home as directed by your health care provider. SEEK MEDICAL CARE IF:   You think you are having a reaction to medicines taken.  You have recurrent headaches or feel dizzy.  You have swelling in your ankles.  You have  trouble with your vision. SEEK IMMEDIATE MEDICAL CARE IF:  You develop a severe headache or confusion.  You have unusual weakness, numbness, or feel faint.  You have severe chest or abdominal pain.  You vomit repeatedly.  You have trouble breathing. MAKE SURE YOU:   Understand these instructions.  Will watch your condition.  Will get help right away if you are not doing well or get worse. Document Released: 05/30/2005 Document Revised: 10/14/2013 Document Reviewed: 03/22/2013 [employer] Patient Information 2015 [employer]. This information is not intended to replace advice given to you by your health care provider. Make sure you discuss any questions you have with your health care provider.  

## 2014-02-07 NOTE — Progress Notes (Signed)
Pre visit review using our clinic review tool, if applicable. No additional management support is needed unless otherwise documented below in the visit note. 

## 2014-02-07 NOTE — Progress Notes (Signed)
Subjective:    Patient ID: Caleb Obrien, male    DOB: [birth year], 48 y.o.   MRN: [id number]  Cough This is a chronic problem. The current episode started more than 1 year ago. The problem has been unchanged. The problem occurs every few hours. The cough is non-productive. Associated symptoms include wheezing. Pertinent negatives include no chest pain, chills, ear congestion, ear pain, fever, headaches, heartburn, hemoptysis, myalgias, nasal congestion, postnasal drip, rash, rhinorrhea, sore throat, shortness of breath, sweats or weight loss. Nothing aggravates the symptoms. Risk factors for lung disease include smoking/tobacco exposure. He has tried a beta-agonist inhaler for the symptoms. The treatment provided mild relief. His past medical history is significant for asthma and environmental allergies. There is no history of bronchiectasis, bronchitis, COPD, emphysema or pneumonia.      Review of Systems  Constitutional: Negative.  Negative for fever, chills, weight loss, diaphoresis, activity change, appetite change, fatigue and unexpected weight change.  HENT: Negative.  Negative for congestion, drooling, ear discharge, ear pain, facial swelling, nosebleeds, postnasal drip, rhinorrhea, sinus pressure, sore throat, trouble swallowing and voice change.   Eyes: Negative.   Respiratory: Positive for cough and wheezing. Negative for apnea, hemoptysis, choking, chest tightness, shortness of breath and stridor.   Cardiovascular: Negative.  Negative for chest pain, palpitations and leg swelling.  Gastrointestinal: Negative.  Negative for heartburn, nausea, vomiting, abdominal pain, diarrhea, constipation, blood in stool, abdominal distention, anal bleeding and rectal pain.  Endocrine: Negative.   Genitourinary: Negative.   Musculoskeletal: Negative.  Negative for arthralgias, back pain, myalgias and neck pain.  Skin: Negative.  Negative for color change, pallor, rash and wound.    Allergic/Immunologic: Positive for environmental allergies.  Neurological: Negative.  Negative for headaches.  Hematological: Negative.  Negative for adenopathy. Does not bruise/bleed easily.  Psychiatric/Behavioral: Negative for suicidal ideas, hallucinations, behavioral problems, confusion, sleep disturbance, self-injury, dysphoric mood, decreased concentration and agitation. The patient is nervous/anxious. The patient is not hyperactive.        Objective:   Physical Exam  Vitals reviewed. Constitutional: He is oriented to person, place, and time. He appears well-developed and well-nourished.  Non-toxic appearance. He does not have a sickly appearance. He does not appear ill. No distress.  HENT:  Head: Normocephalic and atraumatic.  Mouth/Throat: Oropharynx is clear and moist. No oropharyngeal exudate.  Eyes: Conjunctivae are normal. Right eye exhibits no discharge. Left eye exhibits no discharge. No scleral icterus.  Neck: Normal range of motion. Neck supple. No JVD present. No tracheal deviation present. No thyromegaly present.  Cardiovascular: Normal rate, regular rhythm, normal heart sounds and intact distal pulses.  Exam reveals no gallop and no friction rub.   No murmur heard. Pulmonary/Chest: Effort normal and breath sounds normal. No stridor. No respiratory distress. He has no wheezes. He has no rales. He exhibits no tenderness.  Abdominal: Soft. Bowel sounds are normal. He exhibits no distension and no mass. There is no tenderness. There is no rebound and no guarding. Hernia confirmed negative in the right inguinal area and confirmed negative in the left inguinal area.  Genitourinary: Prostate normal, testes normal and penis normal. Rectal exam shows internal hemorrhoid. Rectal exam shows no external hemorrhoid, no fissure, no mass, no tenderness and anal tone normal. Guaiac negative stool. Prostate is not enlarged and not tender. Right testis shows no mass, no swelling and no  tenderness. Right testis is descended. Cremasteric reflex is not absent on the right side. Left testis shows no mass, no swelling  and no tenderness. Left testis is descended. Cremasteric reflex is not absent on the left side. Circumcised. No phimosis, paraphimosis, hypospadias, penile erythema or penile tenderness. No discharge found.  Musculoskeletal: Normal range of motion. He exhibits no edema and no tenderness.  Lymphadenopathy:    He has no cervical adenopathy.       Right: No inguinal adenopathy present.       Left: No inguinal adenopathy present.  Neurological: He is oriented to person, place, and time.  Skin: Skin is warm and dry. No rash noted. He is not diaphoretic. No erythema. No pallor.  Psychiatric: He has a normal mood and affect. His behavior is normal. Judgment and thought content normal.     Lab Results  Component Value Date   WBC 7.3 01/28/2013   HGB 15.3 01/28/2013   HCT 45.0 01/28/2013   PLT 273.0 01/28/2013   GLUCOSE 79 01/28/2013   CHOL 180 01/28/2013   TRIG 83.0 01/28/2013   HDL 44.90 01/28/2013   LDLDIRECT 144.4 01/27/2012   LDLCALC 119* 01/28/2013   ALT 25 01/28/2013   AST 18 01/28/2013   NA 139 01/28/2013   K 4.2 01/28/2013   CL 105 01/28/2013   CREATININE 1.1 01/28/2013   BUN 17 01/28/2013   CO2 28 01/28/2013   TSH 3.93 01/28/2013   PSA 0.56 01/28/2013       Assessment & Plan:

## 2014-02-08 ENCOUNTER — Encounter: Payer: Self-pay | Admitting: Internal Medicine

## 2014-02-08 DIAGNOSIS — Z23 Encounter for immunization: Secondary | ICD-10-CM | POA: Insufficient documentation

## 2014-02-08 DIAGNOSIS — Z720 Tobacco use: Secondary | ICD-10-CM | POA: Insufficient documentation

## 2014-02-08 DIAGNOSIS — J4530 Mild persistent asthma, uncomplicated: Secondary | ICD-10-CM | POA: Insufficient documentation

## 2014-02-08 DIAGNOSIS — I10 Essential (primary) hypertension: Secondary | ICD-10-CM | POA: Insufficient documentation

## 2014-02-08 LAB — HIV ANTIBODY (ROUTINE TESTING W REFLEX): HIV 1&2 Ab, 4th Generation: NONREACTIVE

## 2014-02-08 NOTE — Assessment & Plan Note (Addendum)
Exam done  Vaccines were reviewed - flu vax was given Labs ordered Pt ed material was given

## 2014-02-08 NOTE — Assessment & Plan Note (Signed)
Exam and PSA are normal 

## 2014-02-08 NOTE — Assessment & Plan Note (Signed)
I am concerned that his chronic cough may be related to the ACEI - will stop that He will control his BP with an ARB I will monitor his lytes and renal function today

## 2014-02-08 NOTE — Assessment & Plan Note (Signed)
Will start symbicort for better control of the asthma

## 2014-03-17 ENCOUNTER — Ambulatory Visit (INDEPENDENT_AMBULATORY_CARE_PROVIDER_SITE_OTHER): Payer: BC Managed Care – PPO | Admitting: Internal Medicine

## 2014-03-17 VITALS — BP 122/80 | HR 76 | Temp 98.3°F | Resp 16 | Wt 165.0 lb

## 2014-03-17 DIAGNOSIS — B86 Scabies: Secondary | ICD-10-CM

## 2014-03-17 DIAGNOSIS — I10 Essential (primary) hypertension: Secondary | ICD-10-CM

## 2014-03-17 DIAGNOSIS — J4530 Mild persistent asthma, uncomplicated: Secondary | ICD-10-CM

## 2014-03-17 MED ORDER — PERMETHRIN 5 % EX CREA: 1.0000 "application " | TOPICAL_CREAM | Freq: Once | CUTANEOUS | Status: DC

## 2014-03-17 MED ORDER — BUDESONIDE-FORMOTEROL FUMARATE 80-4.5 MCG/ACT IN AERO: 2.0000 | INHALATION_SPRAY | Freq: Two times a day (BID) | RESPIRATORY_TRACT | Status: DC

## 2014-03-17 NOTE — Patient Instructions (Signed)

## 2014-03-18 ENCOUNTER — Encounter: Payer: Self-pay | Admitting: Internal Medicine

## 2014-03-18 NOTE — Assessment & Plan Note (Signed)
Marked improvement noted with symbicort

## 2014-03-18 NOTE — Assessment & Plan Note (Signed)
His BP is well controlled 

## 2014-03-18 NOTE — Assessment & Plan Note (Signed)
Will treat with elimite

## 2014-03-18 NOTE — Progress Notes (Signed)
   Subjective:    Patient ID: Caleb Obrien, male    DOB: 05-Sep-1965, 48 y.o.   MRN: [id number]  Rash This is a new problem. The current episode started 1 to 4 weeks ago. The problem is unchanged. The affected locations include the torso, left upper leg, right upper leg, abdomen and right buttock. The rash is characterized by redness and itchiness. Associated with: lice/scabies. Pertinent negatives include no anorexia, congestion, cough, diarrhea, eye pain, facial edema, fatigue, fever, joint pain, nail changes, rhinorrhea, shortness of breath, sore throat or vomiting. Past treatments include nothing. The treatment provided no relief.      Review of Systems  Constitutional: Negative for fever, chills, diaphoresis and fatigue.  HENT: Negative.  Negative for congestion, rhinorrhea and sore throat.   Eyes: Negative.  Negative for pain.  Respiratory: Negative.  Negative for apnea, cough, choking, chest tightness, shortness of breath, wheezing and stridor.   Cardiovascular: Negative.  Negative for chest pain, palpitations and leg swelling.  Gastrointestinal: Negative.  Negative for vomiting, abdominal pain, diarrhea and anorexia.  Endocrine: Negative.   Genitourinary: Negative.   Musculoskeletal: Negative.  Negative for arthralgias, back pain and joint pain.  Skin: Positive for rash. Negative for nail changes, color change, pallor and wound.  Allergic/Immunologic: Negative.   Neurological: Negative.  Negative for dizziness.  Hematological: Negative.  Negative for adenopathy. Does not bruise/bleed easily.  Psychiatric/Behavioral: Negative.        Objective:   Physical Exam  Vitals reviewed. Constitutional: He is oriented to person, place, and time. He appears well-developed and well-nourished.  HENT:  Head: Normocephalic and atraumatic.  Mouth/Throat: Oropharynx is clear and moist. No oropharyngeal exudate.  Eyes: Conjunctivae are normal. Right eye exhibits no discharge. Left eye  exhibits no discharge. No scleral icterus.  Neck: Normal range of motion. Neck supple. No JVD present. No tracheal deviation present. No thyromegaly present.  Cardiovascular: Normal rate, regular rhythm, normal heart sounds and intact distal pulses.  Exam reveals no gallop and no friction rub.   No murmur heard. Pulmonary/Chest: Effort normal and breath sounds normal. No stridor. No respiratory distress. He has no wheezes. He has no rales. He exhibits no tenderness.  Abdominal: Soft. Bowel sounds are normal. He exhibits no distension and no mass. There is no tenderness. There is no rebound and no guarding.  Musculoskeletal: Normal range of motion. He exhibits no edema and no tenderness.  Lymphadenopathy:    He has no cervical adenopathy.  Neurological: He is oriented to person, place, and time.  Skin: Skin is warm, dry and intact. Rash noted. No abrasion, no bruising, no burn, no ecchymosis, no laceration, no lesion, no petechiae and no purpura noted. Rash is papular. Rash is not macular, not maculopapular, not nodular, not pustular, not vesicular and not urticarial. He is not diaphoretic. No erythema. No pallor.     He has tiny/faint scattered papules with excoriation and scab formation          Assessment & Plan:

## 2014-08-04 ENCOUNTER — Encounter: Payer: Self-pay | Admitting: Internal Medicine

## 2014-08-04 ENCOUNTER — Other Ambulatory Visit: Payer: Self-pay | Admitting: Internal Medicine

## 2014-08-04 DIAGNOSIS — F411 Generalized anxiety disorder: Secondary | ICD-10-CM

## 2014-08-04 MED ORDER — FLUOXETINE HCL 20 MG PO TABS: 20.0000 mg | ORAL_TABLET | Freq: Every day | ORAL | Status: DC

## 2014-09-13 ENCOUNTER — Other Ambulatory Visit: Payer: Self-pay | Admitting: Family

## 2014-09-15 ENCOUNTER — Other Ambulatory Visit: Payer: Self-pay | Admitting: Family

## 2014-09-16 ENCOUNTER — Encounter: Payer: Self-pay | Admitting: Internal Medicine

## 2014-09-16 NOTE — Telephone Encounter (Signed)
I do not see this on patient's current medication list. She he be on this medication?

## 2014-11-08 ENCOUNTER — Telehealth: Payer: Self-pay | Admitting: Nurse Practitioner

## 2014-11-08 DIAGNOSIS — H109 Unspecified conjunctivitis: Secondary | ICD-10-CM

## 2014-11-08 MED ORDER — POLYMYXIN B-TRIMETHOPRIM 10000-0.1 UNIT/ML-% OP SOLN: 2.0000 [drp] | OPHTHALMIC | Status: DC

## 2014-11-08 NOTE — Progress Notes (Signed)
We are sorry that you are not feeling well.  Here is how we plan to help!   Based on what you have shared with me it looks like you have conjunctivitis.  Conjunctivitis is a common inflammatory or infectious condition of the eye that is often referred to as "pink eye".  In most cases it is contagious (viral or bacterial). However, not all conjunctivitis requires antibiotics (ex. Allergic).  We have made appropriate suggestions for you based upon your presentation.  I have prescribed Polytrim Ophthalmic drops 1-2 drops 4 times a day times 5 days  Pink eye can be highly contagious.  It is typically spread through direct contact with secretions, or contaminated objects or surfaces that one may have touched.  Strict handwashing is suggested with soap and water is urged.  If not available, use alcohol based had sanitizer.  Avoid unnecessary touching of the eye.  If you wear contact lenses, you will need to refrain from wearing them until you see no white discharge from the eye for at least 24 hours after being on medication.  You should see symptom improvement in 1-2 days after starting the medication regimen.  Call us if symptoms are not improved in 1-2 days.  Home Care:  Wash your hands often!  Do not wear your contacts until you complete your treatment plan.  Avoid sharing towels, bed linen, personal items with a person who has pink eye.  See attention for anyone in your home with similar symptoms.  Get Help Right Away If:  Your symptoms do not improve.  You develop blurred or loss of vision.  Your symptoms worsen (increased discharge, pain or redness)  Your e-visit answers were reviewed by a board certified advanced clinical practitioner to complete your personal care plan.  Depending on the condition, your plan could have included both over the counter or prescription medications.  If there is a problem please reply  once you have received a response from your provider.  Your safety is  important to us.  If you have drug allergies check your prescription carefully.    You can use MyChart to ask questions about today's visit, request a non-urgent call back, or ask for a work or school excuse.  You will get an e-mail in the next two days asking about your experience.  I hope that your e-visit has been valuable and will speed your recovery. Thank you for using e-visits.      

## 2014-11-25 ENCOUNTER — Ambulatory Visit (INDEPENDENT_AMBULATORY_CARE_PROVIDER_SITE_OTHER): Payer: BLUE CROSS/BLUE SHIELD | Admitting: Internal Medicine

## 2014-11-25 VITALS — BP 116/68 | HR 84 | Temp 98.6°F | Resp 20 | Ht 68.0 in | Wt 166.0 lb

## 2014-11-25 DIAGNOSIS — H66001 Acute suppurative otitis media without spontaneous rupture of ear drum, right ear: Secondary | ICD-10-CM

## 2014-11-25 DIAGNOSIS — H6981 Other specified disorders of Eustachian tube, right ear: Secondary | ICD-10-CM

## 2014-11-25 DIAGNOSIS — H6500 Acute serous otitis media, unspecified ear: Secondary | ICD-10-CM | POA: Insufficient documentation

## 2014-11-25 DIAGNOSIS — H6501 Acute serous otitis media, right ear: Secondary | ICD-10-CM

## 2014-11-25 DIAGNOSIS — H6980 Other specified disorders of Eustachian tube, unspecified ear: Secondary | ICD-10-CM | POA: Insufficient documentation

## 2014-11-25 MED ORDER — CETIRIZINE HCL 10 MG PO TABS: 10.0000 mg | ORAL_TABLET | Freq: Every day | ORAL | Status: DC

## 2014-11-25 MED ORDER — FLUTICASONE PROPIONATE 50 MCG/ACT NA SUSP: 2.0000 | Freq: Every day | NASAL | Status: DC

## 2014-11-25 MED ORDER — METHYLPREDNISOLONE ACETATE 80 MG/ML IJ SUSP
120.0000 mg | Freq: Once | INTRAMUSCULAR | Status: AC
  Administered 2014-11-25: 120 mg via INTRAMUSCULAR

## 2014-11-25 NOTE — Patient Instructions (Signed)
Barotitis Media Barotitis media is inflammation of your middle ear. This occurs when the auditory tube (eustachian tube) leading from the back of your nose (nasopharynx) to your eardrum is blocked. This blockage may result from a cold, environmental allergies, or an upper respiratory infection. Unresolved barotitis media may lead to damage or hearing loss (barotrauma), which may become permanent. HOME CARE INSTRUCTIONS   Use medicines as recommended by your health care provider. Over-the-counter medicines will help unblock the canal and can help during times of air travel.  Do not put anything into your ears to clean or unplug them. Eardrops will not be helpful.  Do not swim, dive, or fly until your health care provider says it is all right to do so. If these activities are necessary, chewing gum with frequent, forceful swallowing may help. It is also helpful to hold your nose and gently blow to pop your ears for equalizing pressure changes. This forces air into the eustachian tube.  Only take over-the-counter or prescription medicines for pain, discomfort, or fever as directed by your health care provider.  A decongestant may be helpful in decongesting the middle ear and make pressure equalization easier. SEEK MEDICAL CARE IF:  You experience a serious form of dizziness in which you feel as if the room is spinning and you feel nauseated (vertigo).  Your symptoms only involve one ear. SEEK IMMEDIATE MEDICAL CARE IF:   You develop a severe headache, dizziness, or severe ear pain.  You have bloody or pus-like drainage from your ears.  You develop a fever.  Your problems do not improve or become worse. MAKE SURE YOU:   Understand these instructions.  Will watch your condition.  Will get help right away if you are not doing well or get worse. Document Released: 05/27/2000 Document Revised: 03/20/2013 Document Reviewed: 12/25/2012 Exit[employer] Patient Information 2015 [employer]. This  information is not intended to replace advice given to you by your health care provider. Make sure you discuss any questions you have with your health care provider.  

## 2014-11-25 NOTE — Progress Notes (Signed)
Pre visit review using our clinic review tool, if applicable. No additional management support is needed unless otherwise documented below in the visit note. 

## 2014-11-26 ENCOUNTER — Encounter: Payer: Self-pay | Admitting: Internal Medicine

## 2014-11-26 NOTE — Progress Notes (Signed)
Subjective:  Patient ID: Caleb Obrien, male    DOB: 1965-07-24  Age: 49 y.o. MRN: [id number]  CC: No chief complaint on file.   HPI Caleb Obrien presents for a 1 week hx of a full sensation in his right ear. He was treated for pink eye a few weeks ago and those symptoms have resolved but now the right ear feels full and the hearing is muffled. There is no pain with this. He also complains of a runny nose.  Outpatient Prescriptions Prior to Visit  Medication Sig Dispense Refill  . ALPRAZolam (XANAX) 0.25 MG tablet TAKE 1/2 TABLET BY MOUTH AS NEEDED 30 tablet 2  . budesonide-formoterol (SYMBICORT) 80-4.5 MCG/ACT inhaler Inhale 2 puffs into the lungs 2 (two) times daily. 1 Inhaler 11  . FLUoxetine (PROZAC) 20 MG tablet Take 1 tablet (20 mg total) by mouth daily. 90 tablet 3  . levalbuterol (XOPENEX HFA) 45 MCG/ACT inhaler Inhale 1-2 puffs into the lungs every 4 (four) hours as needed for wheezing. 1 Inhaler 3  . lisinopril (PRINIVIL,ZESTRIL) 10 MG tablet TAKE ONE TABLET BY MOUTH ONE TIME DAILY  90 tablet 2  . ranitidine (ZANTAC) 75 MG tablet Take 2 tablets (150 mg total) by mouth at bedtime. 60 tablet 1  . trimethoprim-polymyxin b (POLYTRIM) ophthalmic solution Place 2 drops into both eyes every 4 (four) hours. 10 mL 0  . losartan (COZAAR) 50 MG tablet Take 1 tablet (50 mg total) by mouth daily. (Patient not taking: Reported on 11/25/2014) 90 tablet 3   No facility-administered medications prior to visit.    ROS Review of Systems  Constitutional: Negative.  Negative for fever, chills, diaphoresis, activity change, appetite change, fatigue and unexpected weight change.  HENT: Positive for postnasal drip and rhinorrhea. Negative for ear discharge, ear pain, facial swelling, nosebleeds, sinus pressure, sneezing, sore throat, tinnitus, trouble swallowing and voice change.   Eyes: Negative.   Respiratory: Negative.  Negative for cough, choking, chest tightness, shortness of breath and  stridor.   Cardiovascular: Negative.  Negative for chest pain, palpitations and leg swelling.  Gastrointestinal: Negative.  Negative for nausea, vomiting, abdominal pain, diarrhea, constipation and blood in stool.  Endocrine: Negative.   Genitourinary: Negative.   Musculoskeletal: Negative.  Negative for myalgias, back pain, joint swelling and arthralgias.  Skin: Negative.  Negative for rash.  Allergic/Immunologic: Negative.   Neurological: Negative.  Negative for dizziness, tremors, syncope, light-headedness, numbness and headaches.  Hematological: Negative.  Negative for adenopathy. Does not bruise/bleed easily.  Psychiatric/Behavioral: Negative.     Objective:  BP 116/68 mmHg  Pulse 84  Temp(Src) 98.6 F (37 C) (Oral)  Resp 20  Ht 5\' 8"  (1.727 m)  Wt 166 lb (75.297 kg)  BMI 25.25 kg/m2  BP Readings from Last 3 Encounters:  11/25/14 116/68  03/18/14 122/80  02/07/14 112/78    Wt Readings from Last 3 Encounters:  11/25/14 166 lb (75.297 kg)  03/18/14 165 lb (74.844 kg)  02/07/14 164 lb 12 oz (74.73 kg)    Physical Exam  Constitutional: He is oriented to person, place, and time. He appears well-developed and well-nourished. No distress.  HENT:  Head: Normocephalic and atraumatic.  Right Ear: Hearing, external ear and ear canal normal. No drainage, swelling or tenderness. No mastoid tenderness. Tympanic membrane is bulging. Tympanic membrane is not injected, not scarred, not perforated, not erythematous and not retracted. Tympanic membrane mobility is normal. A middle ear effusion (clear serous fluid) is present. No decreased hearing is noted.  Left Ear: Hearing, tympanic membrane, external ear and ear canal normal.  Nose: No mucosal edema or rhinorrhea. Right sinus exhibits no maxillary sinus tenderness and no frontal sinus tenderness. Left sinus exhibits no maxillary sinus tenderness and no frontal sinus tenderness.  Mouth/Throat: Oropharynx is clear and moist and mucous  membranes are normal. Mucous membranes are not pale, not dry and not cyanotic. No oral lesions. No trismus in the jaw. No uvula swelling. No oropharyngeal exudate, posterior oropharyngeal edema, posterior oropharyngeal erythema or tonsillar abscesses.  Eyes: Conjunctivae are normal. Pupils are equal, round, and reactive to light. Right eye exhibits no discharge. Left eye exhibits no discharge. Right conjunctiva is not injected. Right conjunctiva has no hemorrhage. Left conjunctiva is not injected. Left conjunctiva has no hemorrhage. No scleral icterus.  Neck: Normal range of motion. Neck supple. No JVD present. No tracheal deviation present. No thyromegaly present.  Cardiovascular: Normal rate, regular rhythm, normal heart sounds and intact distal pulses.  Exam reveals no gallop and no friction rub.   No murmur heard. Pulmonary/Chest: Effort normal and breath sounds normal. No stridor. No respiratory distress. He has no wheezes. He has no rales. He exhibits no tenderness.  Abdominal: Soft. Bowel sounds are normal. He exhibits no distension and no mass. There is no tenderness. There is no rebound and no guarding.  Musculoskeletal: Normal range of motion. He exhibits no edema or tenderness.  Lymphadenopathy:    He has no cervical adenopathy.  Neurological: He is oriented to person, place, and time.  Skin: Skin is warm and dry. No rash noted. He is not diaphoretic. No erythema. No pallor.  Vitals reviewed.   Lab Results  Component Value Date   WBC 8.2 02/07/2014   HGB 15.1 02/07/2014   HCT 44.5 02/07/2014   PLT 296.0 02/07/2014   GLUCOSE 85 02/07/2014   CHOL 197 02/07/2014   TRIG 60.0 02/07/2014   HDL 44.50 02/07/2014   LDLDIRECT 144.4 01/27/2012   LDLCALC 141* 02/07/2014   ALT 24 02/07/2014   AST 18 02/07/2014   NA 139 02/07/2014   K 4.3 02/07/2014   CL 103 02/07/2014   CREATININE 1.1 02/07/2014   BUN 11 02/07/2014   CO2 30 02/07/2014   TSH 2.06 02/07/2014   PSA 0.52 02/07/2014     Dg Chest 2 View  01/24/2014   CLINICAL DATA:  Chronic cough.  Chest tightness.  EXAM: CHEST  2 VIEW  COMPARISON:  None.  FINDINGS: The heart size and mediastinal contours are within normal limits. Both lungs are clear.  Bony thorax is intact.  Mild dextroscoliosis of the thoracic spine.  IMPRESSION: No active cardiopulmonary disease.   Electronically Signed   By: Lajean Manes M.D.   On: 01/24/2014 12:44    Assessment & Plan:   Diagnoses and all orders for this visit:  Acute serous otitis media without rupture, right - this appears to be viral, will treat with steroids and zyrtec Orders: -     cetirizine (ZYRTEC) 10 MG tablet; Take 1 tablet (10 mg total) by mouth daily. -     fluticasone (FLONASE) 50 MCG/ACT nasal spray; Place 2 sprays into both nostrils daily.  Eustachian tube dysfunction, right - will treat with an injection of depo-medrol, start flonase NS, he can't take decongestants, will start zyrtec Orders: -     cetirizine (ZYRTEC) 10 MG tablet; Take 1 tablet (10 mg total) by mouth daily. -     fluticasone (FLONASE) 50 MCG/ACT nasal spray; Place 2 sprays into  both nostrils daily. -     methylPREDNISolone acetate (DEPO-MEDROL) injection 120 mg; Inject 1.5 mLs (120 mg total) into the muscle once.   I have discontinued Mr. Mccuistion's losartan and trimethoprim-polymyxin b. I am also having him start on cetirizine and fluticasone. Additionally, I am having him maintain his levalbuterol, ranitidine, ALPRAZolam, budesonide-formoterol, FLUoxetine, and lisinopril. We administered methylPREDNISolone acetate.  Meds ordered this encounter  Medications  . cetirizine (ZYRTEC) 10 MG tablet    Sig: Take 1 tablet (10 mg total) by mouth daily.    Dispense:  30 tablet    Refill:  1  . fluticasone (FLONASE) 50 MCG/ACT nasal spray    Sig: Place 2 sprays into both nostrils daily.    Dispense:  16 g    Refill:  11  . methylPREDNISolone acetate (DEPO-MEDROL) injection 120 mg    Sig:       Follow-up: Return in about 4 weeks (around 12/23/2014).  Scarlette Calico, MD

## 2015-01-28 ENCOUNTER — Other Ambulatory Visit: Payer: Self-pay | Admitting: Internal Medicine

## 2015-04-22 ENCOUNTER — Encounter: Payer: Self-pay | Admitting: Internal Medicine

## 2015-04-27 ENCOUNTER — Encounter: Payer: Self-pay | Admitting: Internal Medicine

## 2015-04-27 ENCOUNTER — Other Ambulatory Visit (INDEPENDENT_AMBULATORY_CARE_PROVIDER_SITE_OTHER): Payer: BLUE CROSS/BLUE SHIELD

## 2015-04-27 ENCOUNTER — Ambulatory Visit (INDEPENDENT_AMBULATORY_CARE_PROVIDER_SITE_OTHER): Payer: BLUE CROSS/BLUE SHIELD | Admitting: Internal Medicine

## 2015-04-27 VITALS — BP 118/80 | HR 79 | Temp 99.3°F | Resp 16 | Ht 68.0 in | Wt 167.0 lb

## 2015-04-27 DIAGNOSIS — Z0000 Encounter for general adult medical examination without abnormal findings: Secondary | ICD-10-CM | POA: Diagnosis not present

## 2015-04-27 DIAGNOSIS — E785 Hyperlipidemia, unspecified: Secondary | ICD-10-CM | POA: Diagnosis not present

## 2015-04-27 DIAGNOSIS — A09 Infectious gastroenteritis and colitis, unspecified: Secondary | ICD-10-CM | POA: Insufficient documentation

## 2015-04-27 DIAGNOSIS — I10 Essential (primary) hypertension: Secondary | ICD-10-CM | POA: Diagnosis not present

## 2015-04-27 DIAGNOSIS — Z23 Encounter for immunization: Secondary | ICD-10-CM | POA: Diagnosis not present

## 2015-04-27 DIAGNOSIS — N522 Drug-induced erectile dysfunction: Secondary | ICD-10-CM

## 2015-04-27 LAB — COMPREHENSIVE METABOLIC PANEL
ALT: 28 U/L (ref 0–53)
AST: 18 U/L (ref 0–37)
Albumin: 4.6 g/dL (ref 3.5–5.2)
Alkaline Phosphatase: 77 U/L (ref 39–117)
BUN: 18 mg/dL (ref 6–23)
CO2: 29 mEq/L (ref 19–32)
Calcium: 9.7 mg/dL (ref 8.4–10.5)
Chloride: 105 mEq/L (ref 96–112)
Creatinine, Ser: 0.96 mg/dL (ref 0.40–1.50)
GFR: 88.99 mL/min (ref >60.00)
Glucose, Bld: 91 mg/dL (ref 70–99)
Potassium: 4.7 mEq/L (ref 3.5–5.1)
Sodium: 141 mEq/L (ref 135–145)
Total Bilirubin: 0.6 mg/dL (ref 0.2–1.2)
Total Protein: 7 g/dL (ref 6.0–8.3)

## 2015-04-27 LAB — CBC WITH DIFFERENTIAL/PLATELET
Basophils Absolute: 0 10*3/uL (ref 0.0–0.1)
Basophils Relative: 0.5 % (ref 0.0–3.0)
Eosinophils Absolute: 0.1 10*3/uL (ref 0.0–0.7)
Eosinophils Relative: 1 % (ref 0.0–5.0)
HCT: 46.5 % (ref 39.0–52.0)
Hemoglobin: 15.7 g/dL (ref 13.0–17.0)
Lymphocytes Relative: 24.5 % (ref 12.0–46.0)
Lymphs Abs: 1.8 10*3/uL (ref 0.7–4.0)
MCHC: 33.8 g/dL (ref 30.0–36.0)
MCV: 98.2 fl (ref 78.0–100.0)
Monocytes Absolute: 1.1 10*3/uL — ABNORMAL HIGH (ref 0.1–1.0)
Monocytes Relative: 15.2 % — ABNORMAL HIGH (ref 3.0–12.0)
Neutro Abs: 4.2 10*3/uL (ref 1.4–7.7)
Neutrophils Relative %: 58.8 % (ref 43.0–77.0)
Platelets: 297 10*3/uL (ref 150.0–400.0)
RBC: 4.74 Mil/uL (ref 4.22–5.81)
RDW: 13.1 % (ref 11.5–15.5)
WBC: 7.2 10*3/uL (ref 4.0–10.5)

## 2015-04-27 LAB — LIPID PANEL
Cholesterol: 229 mg/dL — ABNORMAL HIGH (ref 0–200)
HDL: 51.6 mg/dL (ref >39.00)
LDL Cholesterol: 165 mg/dL — ABNORMAL HIGH (ref 0–99)
NonHDL: 177.48
Total CHOL/HDL Ratio: 4
Triglycerides: 63 mg/dL (ref 0.0–149.0)
VLDL: 12.6 mg/dL (ref 0.0–40.0)

## 2015-04-27 LAB — PSA: PSA: 0.53 ng/mL (ref 0.10–4.00)

## 2015-04-27 LAB — TSH: TSH: 2.42 u[IU]/mL (ref 0.35–4.50)

## 2015-04-27 LAB — HIV ANTIBODY (ROUTINE TESTING W REFLEX): HIV 1&2 Ab, 4th Generation: NONREACTIVE

## 2015-04-27 LAB — FECAL OCCULT BLOOD, GUAIAC: Fecal Occult Blood: NEGATIVE

## 2015-04-27 MED ORDER — TADALAFIL 5 MG PO TABS: 5.0000 mg | ORAL_TABLET | Freq: Every day | ORAL | Status: DC | PRN

## 2015-04-27 MED ORDER — METRONIDAZOLE 500 MG PO TABS: 500.0000 mg | ORAL_TABLET | Freq: Three times a day (TID) | ORAL | Status: DC

## 2015-04-27 NOTE — Patient Instructions (Signed)

## 2015-04-27 NOTE — Progress Notes (Signed)
Subjective:  Patient ID: Caleb Obrien, male    DOB: 1965-12-18  Age: 49 y.o. MRN: [id number]  CC: Annual Exam; Hypertension; and Diarrhea   HPI Caleb Obrien presents for a CPX but he also complains of foul-smelling diarrhea for several weeks and is concerned that he may have a parasite.  Outpatient Prescriptions Prior to Visit  Medication Sig Dispense Refill  . ALPRAZolam (XANAX) 0.25 MG tablet TAKE 1/2 TABLET BY MOUTH AS NEEDED 30 tablet 2  . cetirizine (ZYRTEC) 10 MG tablet TAKE 1 TABLET(10 MG) BY MOUTH DAILY 30 tablet 11  . FLUoxetine (PROZAC) 20 MG tablet Take 1 tablet (20 mg total) by mouth daily. 90 tablet 3  . levalbuterol (XOPENEX HFA) 45 MCG/ACT inhaler Inhale 1-2 puffs into the lungs every 4 (four) hours as needed for wheezing. 1 Inhaler 3  . lisinopril (PRINIVIL,ZESTRIL) 10 MG tablet TAKE ONE TABLET BY MOUTH ONE TIME DAILY  90 tablet 2  . ranitidine (ZANTAC) 75 MG tablet Take 2 tablets (150 mg total) by mouth at bedtime. 60 tablet 1  . budesonide-formoterol (SYMBICORT) 80-4.5 MCG/ACT inhaler Inhale 2 puffs into the lungs 2 (two) times daily. (Patient not taking: Reported on 04/27/2015) 1 Inhaler 11  . fluticasone (FLONASE) 50 MCG/ACT nasal spray Place 2 sprays into both nostrils daily. (Patient not taking: Reported on 04/27/2015) 16 g 11   No facility-administered medications prior to visit.    ROS Review of Systems  Constitutional: Negative.  Negative for fever, chills, diaphoresis, appetite change and fatigue.  HENT: Negative.  Negative for sore throat and trouble swallowing.   Eyes: Negative.   Respiratory: Negative.  Negative for cough, choking, chest tightness, shortness of breath and stridor.   Cardiovascular: Negative.  Negative for chest pain, palpitations and leg swelling.  Gastrointestinal: Positive for diarrhea. Negative for nausea, vomiting, abdominal pain, constipation, blood in stool, abdominal distention, anal bleeding and rectal pain.  Endocrine:  Negative.   Genitourinary: Negative.  Negative for dysuria, urgency, frequency, hematuria, penile swelling, scrotal swelling and difficulty urinating.       +ED  Musculoskeletal: Negative.  Negative for myalgias, back pain, joint swelling and arthralgias.  Skin: Negative.  Negative for rash.  Allergic/Immunologic: Negative.   Neurological: Negative.  Negative for dizziness, tremors, weakness, light-headedness, numbness and headaches.  Hematological: Negative.  Negative for adenopathy. Does not bruise/bleed easily.  Psychiatric/Behavioral: Negative for suicidal ideas, confusion, sleep disturbance, dysphoric mood and decreased concentration. The patient is nervous/anxious.     Objective:  BP 118/80 mmHg  Pulse 79  Temp(Src) 99.3 F (37.4 C) (Oral)  Resp 16  Ht 5\' 8"  (1.727 m)  Wt 167 lb (75.751 kg)  BMI 25.40 kg/m2  SpO2 94%  BP Readings from Last 3 Encounters:  04/27/15 118/80  11/25/14 116/68  03/18/14 122/80    Wt Readings from Last 3 Encounters:  04/27/15 167 lb (75.751 kg)  11/25/14 166 lb (75.297 kg)  03/18/14 165 lb (74.844 kg)    Physical Exam  Constitutional: He is oriented to person, place, and time. No distress.  HENT:  Head: Normocephalic and atraumatic.  Mouth/Throat: Oropharynx is clear and moist. No oropharyngeal exudate.  Eyes: Conjunctivae are normal. Right eye exhibits no discharge. Left eye exhibits no discharge. No scleral icterus.  Neck: Normal range of motion. Neck supple. No JVD present. No tracheal deviation present. No thyromegaly present.  Cardiovascular: Normal rate, regular rhythm and intact distal pulses.  Exam reveals no gallop and no friction rub.   No murmur  heard. Pulmonary/Chest: Effort normal and breath sounds normal. No stridor. No respiratory distress. He has no wheezes. He has no rales. He exhibits no tenderness.  Abdominal: Soft. Bowel sounds are normal. He exhibits no distension and no mass. There is no tenderness. There is no rebound  and no guarding. Hernia confirmed negative in the right inguinal area and confirmed negative in the left inguinal area.  Genitourinary: Rectum normal, prostate normal, testes normal and penis normal. Rectal exam shows no external hemorrhoid, no internal hemorrhoid, no fissure, no mass, no tenderness and anal tone normal. Guaiac negative stool. Prostate is not enlarged and not tender. Right testis shows no mass, no swelling and no tenderness. Right testis is descended. Left testis shows no mass, no swelling and no tenderness. Left testis is descended. Circumcised. No penile erythema or penile tenderness. No discharge found.  Musculoskeletal: Normal range of motion. He exhibits no edema or tenderness.  Lymphadenopathy:    He has no cervical adenopathy.       Right: No inguinal adenopathy present.       Left: No inguinal adenopathy present.  Neurological: He is oriented to person, place, and time.  Skin: Skin is warm and dry. No rash noted. He is not diaphoretic. No erythema. No pallor.  Psychiatric: He has a normal mood and affect. His behavior is normal. Judgment and thought content normal.  Vitals reviewed.   Lab Results  Component Value Date   WBC 8.2 02/07/2014   HGB 15.1 02/07/2014   HCT 44.5 02/07/2014   PLT 296.0 02/07/2014   GLUCOSE 85 02/07/2014   CHOL 197 02/07/2014   TRIG 60.0 02/07/2014   HDL 44.50 02/07/2014   LDLDIRECT 144.4 01/27/2012   LDLCALC 141* 02/07/2014   ALT 24 02/07/2014   AST 18 02/07/2014   NA 139 02/07/2014   K 4.3 02/07/2014   CL 103 02/07/2014   CREATININE 1.1 02/07/2014   BUN 11 02/07/2014   CO2 30 02/07/2014   TSH 2.06 02/07/2014   PSA 0.52 02/07/2014    Dg Chest 2 View  01/24/2014  CLINICAL DATA:  Chronic cough.  Chest tightness. EXAM: CHEST  2 VIEW COMPARISON:  None. FINDINGS: The heart size and mediastinal contours are within normal limits. Both lungs are clear. Bony thorax is intact.  Mild dextroscoliosis of the thoracic spine. IMPRESSION: No  active cardiopulmonary disease. Electronically Signed   By: Lajean Manes M.D.   On: 01/24/2014 12:44    Assessment & Plan:   Caleb Obrien was seen today for annual exam, hypertension and diarrhea.  Diagnoses and all orders for this visit:  Encounter for immunization  Routine general medical examination at a health care facility -     Lipid panel; Future -     PSA; Future -     TSH; Future -     Comprehensive metabolic panel; Future -     CBC with Differential/Platelet; Future -     HIV antibody; Future  Diarrhea of infectious origin-  Will treat empirically for Giardia with metronidazole. -     metroNIDAZOLE (FLAGYL) 500 MG tablet; Take 1 tablet (500 mg total) by mouth 3 (three) times daily.  Drug-induced erectile dysfunction -     tadalafil (CIALIS) 5 MG tablet; Take 1 tablet (5 mg total) by mouth daily as needed for erectile dysfunction.  Other orders -     Flu Vaccine QUAD 36+ mos IM   I have discontinued Mr. Scala's budesonide-formoterol and fluticasone. I am also having him start on  tadalafil and metroNIDAZOLE. Additionally, I am having him maintain his levalbuterol, ranitidine, ALPRAZolam, FLUoxetine, lisinopril, and cetirizine.  Meds ordered this encounter  Medications  . tadalafil (CIALIS) 5 MG tablet    Sig: Take 1 tablet (5 mg total) by mouth daily as needed for erectile dysfunction.    Dispense:  10 tablet    Refill:  5  . metroNIDAZOLE (FLAGYL) 500 MG tablet    Sig: Take 1 tablet (500 mg total) by mouth 3 (three) times daily.    Dispense:  21 tablet    Refill:  0     Follow-up: Return in about 4 weeks (around 05/25/2015).  Scarlette Calico, MD

## 2015-04-27 NOTE — Assessment & Plan Note (Signed)
His blood pressure is well-controlled 

## 2015-04-27 NOTE — Progress Notes (Signed)
Pre visit review using our clinic review tool, if applicable. No additional management support is needed unless otherwise documented below in the visit note. 

## 2015-04-27 NOTE — Assessment & Plan Note (Signed)
Exam done.  He was given a flu shot. Labs ordered and will be reviewed.  patient education material was given.

## 2015-04-28 ENCOUNTER — Encounter: Payer: Self-pay | Admitting: Internal Medicine

## 2015-04-28 DIAGNOSIS — E785 Hyperlipidemia, unspecified: Secondary | ICD-10-CM | POA: Insufficient documentation

## 2015-04-28 MED ORDER — ATORVASTATIN CALCIUM 40 MG PO TABS: 40.0000 mg | ORAL_TABLET | Freq: Every day | ORAL | Status: DC

## 2015-04-28 NOTE — Assessment & Plan Note (Signed)
Will start a statin

## 2015-04-28 NOTE — Addendum Note (Signed)
Addended by: Janith Lima on: 04/28/2015 07:48 AM   Modules accepted: Orders

## 2015-05-04 ENCOUNTER — Encounter: Payer: Self-pay | Admitting: Internal Medicine

## 2015-06-01 ENCOUNTER — Encounter: Payer: Self-pay | Admitting: Internal Medicine

## 2015-06-15 ENCOUNTER — Other Ambulatory Visit: Payer: Self-pay | Admitting: Internal Medicine

## 2015-08-12 ENCOUNTER — Other Ambulatory Visit: Payer: Self-pay | Admitting: Internal Medicine

## 2015-08-17 ENCOUNTER — Other Ambulatory Visit: Payer: Self-pay | Admitting: Internal Medicine

## 2015-08-17 ENCOUNTER — Encounter: Payer: Self-pay | Admitting: Internal Medicine

## 2015-08-17 MED ORDER — ALPRAZOLAM 0.25 MG PO TABS: ORAL_TABLET | ORAL | Status: DC

## 2015-08-19 ENCOUNTER — Other Ambulatory Visit: Payer: Self-pay

## 2015-08-19 MED ORDER — ALPRAZOLAM 0.25 MG PO TABS: ORAL_TABLET | ORAL | Status: DC

## 2015-10-29 DIAGNOSIS — F411 Generalized anxiety disorder: Secondary | ICD-10-CM | POA: Diagnosis not present

## 2016-01-18 ENCOUNTER — Encounter: Payer: Self-pay | Admitting: Internal Medicine

## 2016-01-19 ENCOUNTER — Other Ambulatory Visit: Payer: Self-pay | Admitting: Internal Medicine

## 2016-01-19 DIAGNOSIS — F411 Generalized anxiety disorder: Secondary | ICD-10-CM

## 2016-01-19 MED ORDER — BUPROPION HCL ER (SR) 100 MG PO TB12: 100.0000 mg | ORAL_TABLET | Freq: Two times a day (BID) | ORAL | 3 refills | Status: DC

## 2016-03-04 ENCOUNTER — Other Ambulatory Visit: Payer: Self-pay | Admitting: Internal Medicine

## 2016-03-05 ENCOUNTER — Other Ambulatory Visit: Payer: Self-pay | Admitting: Internal Medicine

## 2016-03-21 DIAGNOSIS — Z23 Encounter for immunization: Secondary | ICD-10-CM | POA: Diagnosis not present

## 2016-03-29 DIAGNOSIS — Z713 Dietary counseling and surveillance: Secondary | ICD-10-CM | POA: Diagnosis not present

## 2016-04-06 ENCOUNTER — Encounter: Payer: Self-pay | Admitting: Nurse Practitioner

## 2016-04-06 ENCOUNTER — Ambulatory Visit (INDEPENDENT_AMBULATORY_CARE_PROVIDER_SITE_OTHER): Payer: BLUE CROSS/BLUE SHIELD | Admitting: Nurse Practitioner

## 2016-04-06 ENCOUNTER — Ambulatory Visit (INDEPENDENT_AMBULATORY_CARE_PROVIDER_SITE_OTHER)
Admission: RE | Admit: 2016-04-06 | Discharge: 2016-04-06 | Disposition: A | Discharge status: Home / Self Care | Payer: BLUE CROSS/BLUE SHIELD | Source: Ambulatory Visit | Attending: Nurse Practitioner | Admitting: Nurse Practitioner

## 2016-04-06 VITALS — BP 138/94 | HR 68 | Temp 98.5°F | Ht 68.0 in | Wt 176.0 lb

## 2016-04-06 DIAGNOSIS — S2232XA Fracture of one rib, left side, initial encounter for closed fracture: Secondary | ICD-10-CM | POA: Insufficient documentation

## 2016-04-06 DIAGNOSIS — S20212A Contusion of left front wall of thorax, initial encounter: Secondary | ICD-10-CM | POA: Diagnosis not present

## 2016-04-06 DIAGNOSIS — S298XXA Other specified injuries of thorax, initial encounter: Secondary | ICD-10-CM | POA: Diagnosis not present

## 2016-04-06 DIAGNOSIS — R079 Chest pain, unspecified: Secondary | ICD-10-CM | POA: Diagnosis not present

## 2016-04-06 NOTE — Progress Notes (Signed)
Pre visit review using our clinic review tool, if applicable. No additional management support is needed unless otherwise documented below in the visit note. 

## 2016-04-06 NOTE — Patient Instructions (Addendum)
Patient decline prescription pain medication at this time. Continue use of ibuprofen 800mg  every 8hrs as needed for pain, take with food. Encourage deep breathing exercise (10 repetitions twice a day). Splint chest wall with pillow to minimize pain during breathing exercise.  Rib Fracture A rib fracture is a break or crack in one of the bones of the ribs. The ribs are like a cage that goes around your upper chest. A broken or cracked rib is often painful, but most do not cause other problems. Most rib fractures heal on their own in 1-3 months. HOME CARE  Avoid activities that cause pain to the injured area. Protect your injured area.  Slowly increase activity as told by your doctor.  Take medicine as told by your doctor.  Put ice on the injured area for the first 1-2 days after you have been treated or as told by your doctor.  Put ice in a plastic bag.  Place a towel between your skin and the bag.  Leave the ice on for 15-20 minutes at a time, every 2 hours while you are awake.  Do deep breathing as told by your doctor. You may be told to:  Take deep breaths many times a day.  Cough many times a day while hugging a pillow.  Use a device (incentive spirometer) to perform deep breathing many times a day.  Drink enough fluids to keep your pee (urine) clear or pale yellow.   Do not wear a rib belt or binder. These do not allow you to breathe deeply. GET HELP RIGHT AWAY IF:   You have a fever.  You have trouble breathing.   You cannot stop coughing.  You cough up thick or bloody spit (mucus).   You feel sick to your stomach (nauseous), throw up (vomit), or have belly (abdominal) pain.   Your pain gets worse and medicine does not help.  MAKE SURE YOU:   Understand these instructions.  Will watch your condition.  Will get help right away if you are not doing well or get worse.   This information is not intended to replace advice given to you by your health care  provider. Make sure you discuss any questions you have with your health care provider.   Document Released: 03/08/2008 Document Revised: 09/24/2012 Document Reviewed: 08/01/2012 Elsevier Interactive Patient Education [employer].

## 2016-04-06 NOTE — Progress Notes (Signed)
Subjective:  Patient ID: Caleb Obrien, male    DOB: 06/25/1965  Age: 50 y.o. MRN: [id number]  CC: Rib Injury (Pt stated fell and the rib on the left is painful when breathing/turning.)  Chest Pain   This is a new problem. The current episode started in the past 7 days. The onset quality is sudden. The problem occurs constantly. The problem has been gradually worsening. The pain is present in the lateral region (left side). The quality of the pain is described as sharp and stabbing. The pain does not radiate. Pertinent negatives include no back pain, cough, exertional chest pressure, fever, irregular heartbeat, malaise/fatigue, orthopnea, palpitations, PND or shortness of breath.  onset of left chest wall pain after falling against furniture at home.  Outpatient Medications Prior to Visit  Medication Sig Dispense Refill  . ALPRAZolam (XANAX) 0.25 MG tablet TAKE 1/2 TABLET BY MOUTH AS NEEDED 30 tablet 2  . atorvastatin (LIPITOR) 40 MG tablet Take 1 tablet (40 mg total) by mouth daily. 90 tablet 3  . buPROPion (WELLBUTRIN SR) 100 MG 12 hr tablet Take 1 tablet (100 mg total) by mouth 2 (two) times daily. 60 tablet 3  . cetirizine (ZYRTEC) 10 MG tablet TAKE 1 TABLET(10 MG) BY MOUTH DAILY 30 tablet 11  . FLUoxetine (PROZAC) 20 MG tablet TAKE 1 TABLET EVERY DAY 340 tablet 1  . levalbuterol (XOPENEX HFA) 45 MCG/ACT inhaler Inhale 1-2 puffs into the lungs every 4 (four) hours as needed for wheezing. 1 Inhaler 3  . lisinopril (PRINIVIL,ZESTRIL) 10 MG tablet TAKE ONE TABLET BY MOUTH ONE TIME DAILY 90 tablet 0  . ranitidine (ZANTAC) 75 MG tablet Take 2 tablets (150 mg total) by mouth at bedtime. 60 tablet 1  . tadalafil (CIALIS) 5 MG tablet Take 1 tablet (5 mg total) by mouth daily as needed for erectile dysfunction. 10 tablet 5  . lisinopril (PRINIVIL,ZESTRIL) 10 MG tablet Take 1 tablet (10 mg total) by mouth daily. appt needed for refills. 30 tablet 1  . metroNIDAZOLE (FLAGYL) 500 MG tablet Take 1  tablet (500 mg total) by mouth 3 (three) times daily. 21 tablet 0   No facility-administered medications prior to visit.     ROS See HPI  Objective:  BP (!) 138/94 (BP Location: Left Arm, Patient Position: Sitting, Cuff Size: Normal)   Pulse 68   Temp 98.5 F (36.9 C)   Ht 5\' 8"  (1.727 m)   Wt 176 lb (79.8 kg)   SpO2 98%   BMI 26.76 kg/m   BP Readings from Last 3 Encounters:  04/06/16 (!) 138/94  04/27/15 118/80  11/25/14 116/68    Wt Readings from Last 3 Encounters:  04/06/16 176 lb (79.8 kg)  04/27/15 167 lb (75.8 kg)  11/25/14 166 lb (75.3 kg)    Physical Exam  Constitutional: He is oriented to person, place, and time. No distress.  Neck: Normal range of motion. Neck supple.  Cardiovascular: Normal rate and normal heart sounds.   Pulmonary/Chest: Effort normal and breath sounds normal. No respiratory distress. He has no wheezes. He has no rales. He exhibits tenderness.  Left anterior chest wall tenderness. No crepitus. No ecchymosis. Even chest wall excursion.  Musculoskeletal: Normal range of motion. He exhibits no edema.  Neurological: He is alert and oriented to person, place, and time.  Skin: Skin is warm and dry. No rash noted. No erythema.  Vitals reviewed.   Lab Results  Component Value Date   WBC 7.2 04/27/2015  HGB 15.7 04/27/2015   HCT 46.5 04/27/2015   PLT 297.0 04/27/2015   GLUCOSE 91 04/27/2015   CHOL 229 (H) 04/27/2015   TRIG 63.0 04/27/2015   HDL 51.60 04/27/2015   LDLDIRECT 144.4 01/27/2012   LDLCALC 165 (H) 04/27/2015   ALT 28 04/27/2015   AST 18 04/27/2015   NA 141 04/27/2015   K 4.7 04/27/2015   CL 105 04/27/2015   CREATININE 0.96 04/27/2015   BUN 18 04/27/2015   CO2 29 04/27/2015   TSH 2.42 04/27/2015   PSA 0.53 04/27/2015    Dg Chest 2 View  Result Date: 01/24/2014 CLINICAL DATA:  Chronic cough.  Chest tightness. EXAM: CHEST  2 VIEW COMPARISON:  None. FINDINGS: The heart size and mediastinal contours are within normal  limits. Both lungs are clear. Bony thorax is intact.  Mild dextroscoliosis of the thoracic spine. IMPRESSION: No active cardiopulmonary disease. Electronically Signed   By: Lajean Manes M.D.   On: 01/24/2014 12:44   Today's chest x-ray indicates left nondisplaced 6th rib fracture. Clear lungs.  Assessment & Plan:   Sun was seen today for rib injury.  Diagnoses and all orders for this visit:  Contusion of left chest wall, initial encounter -     DG Ribs Unilateral W/Chest Left; Future  Blunt trauma to chest, initial encounter -     DG Ribs Unilateral W/Chest Left; Future  Closed fracture of one rib of left side, initial encounter Comments: 6th rib, nondisplaced   I have discontinued Mr. Delorenzo's metroNIDAZOLE. I am also having him maintain his levalbuterol, ranitidine, cetirizine, tadalafil, atorvastatin, FLUoxetine, ALPRAZolam, buPROPion, and lisinopril.  No orders of the defined types were placed in this encounter.   Follow-up: Return if symptoms worsen or fail to improve.  Wilfred Lacy, NP

## 2016-04-12 ENCOUNTER — Other Ambulatory Visit (INDEPENDENT_AMBULATORY_CARE_PROVIDER_SITE_OTHER): Payer: BLUE CROSS/BLUE SHIELD

## 2016-04-12 ENCOUNTER — Ambulatory Visit (INDEPENDENT_AMBULATORY_CARE_PROVIDER_SITE_OTHER): Payer: BLUE CROSS/BLUE SHIELD | Admitting: Internal Medicine

## 2016-04-12 ENCOUNTER — Encounter: Payer: Self-pay | Admitting: Internal Medicine

## 2016-04-12 VITALS — BP 150/100 | HR 81 | Temp 98.4°F | Resp 16 | Ht 68.0 in | Wt 174.5 lb

## 2016-04-12 DIAGNOSIS — R61 Generalized hyperhidrosis: Secondary | ICD-10-CM

## 2016-04-12 DIAGNOSIS — I10 Essential (primary) hypertension: Secondary | ICD-10-CM

## 2016-04-12 DIAGNOSIS — Z7251 High risk heterosexual behavior: Secondary | ICD-10-CM

## 2016-04-12 DIAGNOSIS — Z7252 High risk homosexual behavior: Secondary | ICD-10-CM | POA: Insufficient documentation

## 2016-04-12 DIAGNOSIS — Z0000 Encounter for general adult medical examination without abnormal findings: Secondary | ICD-10-CM | POA: Diagnosis not present

## 2016-04-12 DIAGNOSIS — R0683 Snoring: Secondary | ICD-10-CM

## 2016-04-12 LAB — CBC WITH DIFFERENTIAL/PLATELET
Basophils Absolute: 0 10*3/uL (ref 0.0–0.1)
Basophils Relative: 0.3 % (ref 0.0–3.0)
Eosinophils Absolute: 0.1 10*3/uL (ref 0.0–0.7)
Eosinophils Relative: 1.1 % (ref 0.0–5.0)
HCT: 46.9 % (ref 39.0–52.0)
Hemoglobin: 16.2 g/dL (ref 13.0–17.0)
Lymphocytes Relative: 21.2 % (ref 12.0–46.0)
Lymphs Abs: 1.9 10*3/uL (ref 0.7–4.0)
MCHC: 34.5 g/dL (ref 30.0–36.0)
MCV: 96.7 fl (ref 78.0–100.0)
Monocytes Absolute: 1.1 10*3/uL — ABNORMAL HIGH (ref 0.1–1.0)
Monocytes Relative: 12.7 % — ABNORMAL HIGH (ref 3.0–12.0)
Neutro Abs: 5.7 10*3/uL (ref 1.4–7.7)
Neutrophils Relative %: 64.7 % (ref 43.0–77.0)
Platelets: 359 10*3/uL (ref 150.0–400.0)
RBC: 4.85 Mil/uL (ref 4.22–5.81)
RDW: 13.1 % (ref 11.5–15.5)
WBC: 8.8 10*3/uL (ref 4.0–10.5)

## 2016-04-12 LAB — URINALYSIS, ROUTINE W REFLEX MICROSCOPIC
Ketones, ur: NEGATIVE
Leukocytes, UA: NEGATIVE
Nitrite: NEGATIVE
Specific Gravity, Urine: 1.02 (ref 1.000–1.030)
Total Protein, Urine: NEGATIVE
Urine Glucose: NEGATIVE
Urobilinogen, UA: 0.2 (ref 0.0–1.0)
pH: 6 (ref 5.0–8.0)

## 2016-04-12 LAB — LIPID PANEL
Cholesterol: 176 mg/dL (ref 0–200)
HDL: 56.3 mg/dL (ref >39.00)
LDL Cholesterol: 101 mg/dL — ABNORMAL HIGH (ref 0–99)
NonHDL: 120
Total CHOL/HDL Ratio: 3
Triglycerides: 97 mg/dL (ref 0.0–149.0)
VLDL: 19.4 mg/dL (ref 0.0–40.0)

## 2016-04-12 LAB — TSH: TSH: 3.03 u[IU]/mL (ref 0.35–4.50)

## 2016-04-12 LAB — COMPREHENSIVE METABOLIC PANEL
ALT: 42 U/L (ref 0–53)
AST: 28 U/L (ref 0–37)
Albumin: 4.8 g/dL (ref 3.5–5.2)
Alkaline Phosphatase: 126 U/L — ABNORMAL HIGH (ref 39–117)
BUN: 15 mg/dL (ref 6–23)
CO2: 28 mEq/L (ref 19–32)
Calcium: 10 mg/dL (ref 8.4–10.5)
Chloride: 103 mEq/L (ref 96–112)
Creatinine, Ser: 0.95 mg/dL (ref 0.40–1.50)
GFR: 89.71 mL/min (ref >60.00)
Glucose, Bld: 94 mg/dL (ref 70–99)
Potassium: 3.9 mEq/L (ref 3.5–5.1)
Sodium: 141 mEq/L (ref 135–145)
Total Bilirubin: 1 mg/dL (ref 0.2–1.2)
Total Protein: 7.5 g/dL (ref 6.0–8.3)

## 2016-04-12 LAB — PSA: PSA: 0.91 ng/mL (ref 0.10–4.00)

## 2016-04-12 MED ORDER — IRBESARTAN-HYDROCHLOROTHIAZIDE 300-12.5 MG PO TABS: 1.0000 | ORAL_TABLET | Freq: Every day | ORAL | 1 refills | Status: DC

## 2016-04-12 MED ORDER — ASPIRIN EC 81 MG PO TBEC: 81.0000 mg | DELAYED_RELEASE_TABLET | Freq: Every day | ORAL | 3 refills | Status: DC

## 2016-04-12 NOTE — Patient Instructions (Signed)

## 2016-04-12 NOTE — Progress Notes (Signed)
Subjective:  Patient ID: Caleb Obrien, male    DOB: 1965-08-20  Age: 50 y.o. MRN: [id number]  CC: Annual Exam and Hypertension   HPI Caleb Obrien presents for a CPX.  He is thinking about starting PrEP. He has about 1-2 sexual encounters per month and he reports that most of them are safe.  His BP has not been well controlled. A recent BP at home was 157/97 and he had a mild headache. He denies  SOB, DOE, palpitations, edema, or syncope. He recently tripped and injured his left chest wall and he tells me that the pain is improving. He has heavy snoring and is concerned that he may have sleep apnea.  Outpatient Medications Prior to Visit  Medication Sig Dispense Refill  . ALPRAZolam (XANAX) 0.25 MG tablet TAKE 1/2 TABLET BY MOUTH AS NEEDED 30 tablet 2  . atorvastatin (LIPITOR) 40 MG tablet Take 1 tablet (40 mg total) by mouth daily. 90 tablet 3  . buPROPion (WELLBUTRIN SR) 100 MG 12 hr tablet Take 1 tablet (100 mg total) by mouth 2 (two) times daily. 60 tablet 3  . cetirizine (ZYRTEC) 10 MG tablet TAKE 1 TABLET(10 MG) BY MOUTH DAILY 30 tablet 11  . FLUoxetine (PROZAC) 20 MG tablet TAKE 1 TABLET EVERY DAY 340 tablet 1  . levalbuterol (XOPENEX HFA) 45 MCG/ACT inhaler Inhale 1-2 puffs into the lungs every 4 (four) hours as needed for wheezing. 1 Inhaler 3  . ranitidine (ZANTAC) 75 MG tablet Take 2 tablets (150 mg total) by mouth at bedtime. 60 tablet 1  . tadalafil (CIALIS) 5 MG tablet Take 1 tablet (5 mg total) by mouth daily as needed for erectile dysfunction. 10 tablet 5  . lisinopril (PRINIVIL,ZESTRIL) 10 MG tablet TAKE ONE TABLET BY MOUTH ONE TIME DAILY 90 tablet 0   No facility-administered medications prior to visit.     ROS Review of Systems  Constitutional: Negative for appetite change, diaphoresis, fatigue and fever.       He has had a few night sweats over the last week  HENT: Negative.  Negative for sore throat and trouble swallowing.   Eyes: Negative for  photophobia and visual disturbance.  Respiratory: Negative for cough, choking, chest tightness, shortness of breath and stridor.   Cardiovascular: Positive for chest pain. Negative for leg swelling.       Intermittent, sharp left-sided CP with movement  Gastrointestinal: Negative.  Negative for abdominal pain, constipation, diarrhea, nausea and vomiting.  Endocrine: Negative.   Genitourinary: Negative.  Negative for decreased urine volume, difficulty urinating, discharge, dysuria, genital sores, penile pain, penile swelling, scrotal swelling, testicular pain and urgency.  Musculoskeletal: Negative.  Negative for arthralgias, back pain, myalgias and neck pain.  Skin: Negative.  Negative for color change, pallor and rash.  Neurological: Positive for headaches. Negative for dizziness and weakness.  Hematological: Negative.  Negative for adenopathy. Does not bruise/bleed easily.  Psychiatric/Behavioral: Negative.     Objective:  BP (!) 150/100 (BP Location: Left Arm, Patient Position: Sitting, Cuff Size: Normal)   Pulse 81   Temp 98.4 F (36.9 C) (Oral)   Resp 16   Ht 5\' 8"  (1.727 m)   Wt 174 lb 8 oz (79.2 kg)   SpO2 98%   BMI 26.53 kg/m   BP Readings from Last 3 Encounters:  04/12/16 (!) 150/100  04/06/16 (!) 138/94  04/27/15 118/80    Wt Readings from Last 3 Encounters:  04/12/16 174 lb 8 oz (79.2 kg)  04/06/16  176 lb (79.8 kg)  04/27/15 167 lb (75.8 kg)    Physical Exam  Constitutional: He is oriented to person, place, and time. He appears well-developed and well-nourished. No distress.  HENT:  Head: Normocephalic and atraumatic.  Mouth/Throat: Oropharynx is clear and moist. No oropharyngeal exudate.  Eyes: Conjunctivae are normal. Right eye exhibits no discharge. Left eye exhibits no discharge. No scleral icterus.  Neck: Normal range of motion. Neck supple. No JVD present. No tracheal deviation present. No thyromegaly present.  Cardiovascular: Normal rate, regular rhythm,  normal heart sounds and intact distal pulses.  Exam reveals no gallop and no friction rub.   No murmur heard. Pulmonary/Chest: Effort normal and breath sounds normal. No stridor. No respiratory distress. He has no wheezes. He has no rales. He exhibits no tenderness.  Abdominal: Soft. Bowel sounds are normal. He exhibits no distension and no mass. There is no tenderness. There is no rebound and no guarding. Hernia confirmed negative in the right inguinal area and confirmed negative in the left inguinal area.  Genitourinary: Rectum normal, prostate normal, testes normal and penis normal. Rectal exam shows no external hemorrhoid, no internal hemorrhoid, no fissure, no mass, no tenderness, anal tone normal and guaiac negative stool. Prostate is not enlarged and not tender. Right testis shows no mass, no swelling and no tenderness. Right testis is descended. Left testis shows no mass, no swelling and no tenderness. Left testis is descended. Circumcised. No penile erythema or penile tenderness. No discharge found.  Musculoskeletal: Normal range of motion. He exhibits no edema, tenderness or deformity.  Lymphadenopathy:    He has no cervical adenopathy.       Right: No inguinal adenopathy present.       Left: No inguinal adenopathy present.  Neurological: He is oriented to person, place, and time.  Skin: Skin is warm and dry. No rash noted. He is not diaphoretic. No erythema. No pallor.  Psychiatric: He has a normal mood and affect. His behavior is normal. Judgment and thought content normal.  Vitals reviewed.   Lab Results  Component Value Date   WBC 8.8 04/12/2016   HGB 16.2 04/12/2016   HCT 46.9 04/12/2016   PLT 359.0 04/12/2016   GLUCOSE 94 04/12/2016   CHOL 176 04/12/2016   TRIG 97.0 04/12/2016   HDL 56.30 04/12/2016   LDLDIRECT 144.4 01/27/2012   LDLCALC 101 (H) 04/12/2016   ALT 42 04/12/2016   AST 28 04/12/2016   NA 141 04/12/2016   K 3.9 04/12/2016   CL 103 04/12/2016   CREATININE  0.95 04/12/2016   BUN 15 04/12/2016   CO2 28 04/12/2016   TSH 3.03 04/12/2016   PSA 0.91 04/12/2016    Dg Ribs Unilateral W/chest Left  Result Date: 04/06/2016 CLINICAL DATA:  Fall 4 days ago with left-sided chest pain EXAM: LEFT RIBS AND CHEST - 3+ VIEW COMPARISON:  01/24/2014 FINDINGS: Cardiac shadow is within normal limits. The lungs are clear bilaterally. No pneumothorax or sizable effusion is noted. Minimal irregularity of the anterior aspect of the left sixth rib is noted which may represent an undisplaced fracture. No other fractures are noted. IMPRESSION: Irregularity of the anterior aspect of the left sixth rib which may represent an undisplaced fracture. Electronically Signed   By: Inez Catalina M.D.   On: 04/06/2016 15:14    Assessment & Plan:   Ramone was seen today for annual exam and hypertension.  Diagnoses and all orders for this visit:  Routine general medical examination at a  health care facility - Exam completed, labs ordered and reviewed, vaccines reviewed, patient education material was given. -     Lipid panel; Future -     Comprehensive metabolic panel; Future -     CBC with Differential/Platelet; Future -     TSH; Future -     HIV antibody; Future -     PSA; Future -     Urinalysis, Routine w reflex microscopic (not at [facility]); Future -     aspirin EC 81 MG tablet; Take 1 tablet (81 mg total) by mouth daily.  Night sweats- his exam and labs indicate no secondary causes of this such as infection, lymphoproliferative disease, or inflammation. He will let me know if the symptoms persist then I will investigate further. -     Hepatitis A antibody, total; Future -     Hepatitis B core antibody, total; Future -     Hepatitis B surface antibody; Future -     Hepatitis B surface antigen; Future -     RPR; Future  High risk sexual behavior- at this time he is not willing to commit to taking Truvada, preliminary lab work shows that he would be a candidate for it since  his HIV test is negative, he is negative for hepatitis B surface antigen, and he has normal renal function. He will let me know if he wants to start taking Truvada. -     Hepatitis A antibody, total; Future -     Hepatitis B core antibody, total; Future -     Hepatitis B surface antibody; Future -     Hepatitis B surface antigen; Future -     RPR; Future  Essential hypertension, benign- his blood pressure is not adequately well controlled, I don't think the ACE inhibitor is potent enough to control his blood pressure so I have asked him to change to an ARB/thiazide combination. His labs today indicate mild hematuria. I will recheck the urinalysis after the blood pressure is well-controlled to see if the elevated blood pressure is causing the hematuria. Otherwise his labs show no secondary causes for hypertension and no other evidence of end organ damage. -     irbesartan-hydrochlorothiazide (AVALIDE) 300-12.5 MG tablet; Take 1 tablet by mouth daily.  Snoring- I've recommended that he have a sleep study done to screen for sleep apnea as this may be contributing to his elevated blood pressure. -     Ambulatory referral to Sleep Studies   I have discontinued Mr. Kretsch's lisinopril. I am also having him start on irbesartan-hydrochlorothiazide and aspirin EC. Additionally, I am having him maintain his levalbuterol, ranitidine, cetirizine, tadalafil, atorvastatin, FLUoxetine, ALPRAZolam, and buPROPion.  Meds ordered this encounter  Medications  . irbesartan-hydrochlorothiazide (AVALIDE) 300-12.5 MG tablet    Sig: Take 1 tablet by mouth daily.    Dispense:  90 tablet    Refill:  1  . aspirin EC 81 MG tablet    Sig: Take 1 tablet (81 mg total) by mouth daily.    Dispense:  90 tablet    Refill:  3     Follow-up: Return in about 6 weeks (around 05/24/2016).  Scarlette Calico, MD

## 2016-04-12 NOTE — Progress Notes (Signed)
Pre visit review using our clinic review tool, if applicable. No additional management support is needed unless otherwise documented below in the visit note. 

## 2016-04-13 ENCOUNTER — Encounter: Payer: Self-pay | Admitting: Internal Medicine

## 2016-04-13 LAB — HEPATITIS B SURFACE ANTIBODY,QUALITATIVE

## 2016-04-13 LAB — HEPATITIS A ANTIBODY, TOTAL: Hep A Total Ab: REACTIVE — AB

## 2016-04-13 LAB — RPR

## 2016-04-13 LAB — HEPATITIS B CORE ANTIBODY, TOTAL: Hep B Core Total Ab: NONREACTIVE

## 2016-04-13 LAB — HIV ANTIBODY (ROUTINE TESTING W REFLEX): HIV 1&2 Ab, 4th Generation: NONREACTIVE

## 2016-04-13 LAB — HEPATITIS B SURFACE ANTIGEN: Hepatitis B Surface Ag: NEGATIVE

## 2016-04-17 DIAGNOSIS — R0683 Snoring: Secondary | ICD-10-CM | POA: Insufficient documentation

## 2016-04-26 ENCOUNTER — Ambulatory Visit (INDEPENDENT_AMBULATORY_CARE_PROVIDER_SITE_OTHER): Payer: BLUE CROSS/BLUE SHIELD | Admitting: Neurology

## 2016-04-26 ENCOUNTER — Encounter: Payer: Self-pay | Admitting: Neurology

## 2016-04-26 ENCOUNTER — Telehealth: Payer: Self-pay

## 2016-04-26 ENCOUNTER — Other Ambulatory Visit: Payer: Self-pay | Admitting: Internal Medicine

## 2016-04-26 VITALS — BP 125/87 | HR 99 | Resp 20 | Ht 68.0 in | Wt 170.0 lb

## 2016-04-26 DIAGNOSIS — R0681 Apnea, not elsewhere classified: Secondary | ICD-10-CM

## 2016-04-26 DIAGNOSIS — K219 Gastro-esophageal reflux disease without esophagitis: Secondary | ICD-10-CM

## 2016-04-26 DIAGNOSIS — R0683 Snoring: Secondary | ICD-10-CM | POA: Diagnosis not present

## 2016-04-26 DIAGNOSIS — I10 Essential (primary) hypertension: Secondary | ICD-10-CM | POA: Diagnosis not present

## 2016-04-26 DIAGNOSIS — G4733 Obstructive sleep apnea (adult) (pediatric): Secondary | ICD-10-CM | POA: Diagnosis not present

## 2016-04-26 MED ORDER — ALPRAZOLAM 0.25 MG PO TABS: ORAL_TABLET | ORAL | 3 refills | Status: DC

## 2016-04-26 NOTE — Progress Notes (Signed)
SLEEP MEDICINE CLINIC   Provider:  Larey Seat, M D  Referring Provider: Janith Lima, MD Primary Care Physician:  Scarlette Calico, MD  Chief Complaint  Patient presents with  . New Patient (Initial Visit)    sleep study in past, heavy snoring    HPI:  Caleb Obrien is a 50 y.o. male , seen here as a referral  from Dr. Ronnald Ramp for a possible sleep study,  Mr. Kuczkowski underwent a sleep study attended in [place] about 3 years ago and afterwards was referred to for dental treatment with a mandibular advancement device. His claim was denied by his health insurance and resulted in a lot of costs to him. He never had the device made. He has used a dental device that he bought on the Internet, but he is not sure that it has helped. He is known to be a loud snorer and his snoring may not have been reduced at all as he describes, he also reports that he had choking and apnea attacks waking him out of sleep and these have continued while he used a new device. Snoring has been ongoing as has choking . He has not had a good night sleep, not refreshing nor of restoring quality. He is excessivly sleepy while driving, has tried a lot of caffeine through out the day and scheduled stops - with minimal success. His overnight sleep deteriorated further while using caffeine.    Sleep habits are as follows: Usually goes to bed by 11 PM and falls asleep within 30 minutes or less. Bedroom is cool, quiet and dark. He is going to sleep usually on his sides but wakes  on his back more often.  He suffers from GERD which also makes sleep sometimes less pleasant. He takes ranitidine over-the-counter in AM ! He props his head with 2 pillows, but he has continued to notice heavy snoring and wakes up from choking and apneic spells. He has allergic airway disease, but stopped antihistamines. His nose is frequently runny.   He does not have shortness of breath, palpitations, dependency edema, and has not experienced any  fainting spells.  He does not have orthostasis, sometimes has a mild headache with elevated blood pressures, he does wake up clammy and diaphoretic however. Usually he has one nocturnal bathroom break and wakes with the urge of urination. He usually does not wake up with headaches in the morning. He does not have a dry mouth in the morning, and she does not drink water during the night. Wakes up at 5.30 by alarm, read the news in bed than rises. He has not longer taken caffeine in AM.     Sleep medical history and family sleep history: Mother has OSA and uses CPAP.  Mr. Sellitti has a history of hypertension, hypercholesterolemia, depression with more anxiety than depression manifestation, he very rarely takes Xanax which was prescribed for anxiety attacks, he has Lipitor, Wellbutrin, Zyrtec as needed, Xopenex  Inhaler- as needed for wheezing, Zantac, Cialis, Ibusartin.   Social history:   Caffeine - not longer using, ETOH weekends and week days, daily- liquor , 2 glasses, he quit smoking just 3 weeks ago and used to smoke two cigs a day.  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 He is single,  one child ( son, 63 ).   Review of Systems: Out of a complete 14  system review, the patient complains of only the following symptoms, and all other reviewed systems are negative. He has recently had some scary experiences where he felt overwhelmed by the need to sleep while driving. He has been known to snore, he reports sleep choking, witnessed snoring, excessive daytime sleepiness, fatigue.  Epworth score 13, he endorsed 0 points to falling asleep in conversations 1 point assigned to : sitting quietly after lunch "or when his, stopped in traffic,  3 points to lying down to rest in the afternoon, and 2 points for the first 4 questions of sleep questionnaire. He endorsed the fatigue severity at 39 points, which is  also elevated.  depression score 2/15 , currently treated for anxiety and depression.    Social History   Social History  . Marital status: Single    Spouse name: N/A  . Number of children: N/A  . Years of education: N/A   Occupational History  . [place] History Main Topics  . Smoking status: Light Tobacco Smoker    Packs/day: 0.30    Types: Cigarettes  . Smokeless tobacco: Never Used  . Alcohol use 1.5 oz/week    3 drink(s) per week     Comment: 2 beverages daily  . Drug use: No  . Sexual activity: Yes   Other Topics Concern  . Not on file   Social History Narrative  . No narrative on file    Family History  Problem Relation Age of Onset  . Hypertension Mother   . Stroke Mother   . Arthritis Mother   . Prostate cancer Father     Past Medical History:  Diagnosis Date  . ALLERGIC RHINITIS CAUSE UNSPECIFIED 02/24/2009  . ASTHMA 02/05/2007  . BENIGN PROSTATIC HYPERTROPHY, WITH URINARY OBSTRUCTION 05/22/2007  . Dermatophytosis of foot 11/02/2009  . GANGLION CYST, WRIST, LEFT 03/25/2009  . Hemangioma of skin and subcutaneous tissue 06/30/2009  . HEPATITIS B, HX OF 02/05/2007  . [place], [place] 04/21/2008  . PLANTAR WART, RIGHT 01/23/2009    Past Surgical History:  Procedure Laterality Date  . WISDOM TOOTH EXTRACTION      Current Outpatient Prescriptions  Medication Sig Dispense Refill  . ALPRAZolam (XANAX) 0.25 MG tablet TAKE 1/2 TABLET BY MOUTH AS NEEDED 30 tablet 2  . aspirin EC 81 MG tablet Take 1 tablet (81 mg total) by mouth daily. 90 tablet 3  . atorvastatin (LIPITOR) 40 MG tablet Take 1 tablet (40 mg total) by mouth daily. 90 tablet 3  . buPROPion (WELLBUTRIN SR) 100 MG 12 hr tablet Take 1 tablet (100 mg total) by mouth 2 (two) times daily. 60 tablet 3  . cetirizine (ZYRTEC) 10 MG tablet TAKE 1 TABLET(10 MG) BY MOUTH DAILY 30 tablet 11  . FLUoxetine (PROZAC) 20 MG tablet TAKE 1 TABLET EVERY DAY 340 tablet 1  .  irbesartan-hydrochlorothiazide (AVALIDE) 300-12.5 MG tablet Take 1 tablet by mouth daily. 90 tablet 1  . levalbuterol (XOPENEX HFA) 45 MCG/ACT inhaler Inhale 1-2 puffs into the lungs every 4 (four) hours as needed for wheezing. 1 Inhaler 3  . ranitidine (ZANTAC) 75 MG tablet Take 2 tablets (150 mg total) by mouth at bedtime. 60 tablet 1  . tadalafil (CIALIS) 5 MG tablet Take 1 tablet (5 mg total) by mouth daily as needed for erectile dysfunction. 10 tablet 5   No current facility-administered medications for this visit.     Allergies as of 04/26/2016 - Review Complete 04/26/2016  Allergen Reaction Noted  .  Amlodipine  02/07/2014  . Lisinopril  02/07/2014  . Sudafed [pseudoephedrine hcl] Other (See Comments) 11/26/2014    Vitals: BP 125/87   Pulse 99   Resp 20   Ht 5\' 8"  (1.727 m)   Wt 170 lb (77.1 kg)   BMI 25.85 kg/m  Last Weight:  Wt Readings from Last 1 Encounters:  04/26/16 170 lb (77.1 kg)   [id number] mass index is 25.85 kg/m.     Last Height:   Ht Readings from Last 1 Encounters:  04/26/16 5\' 8"  (1.727 m)    Physical exam:  General: The patient is awake, alert and appears not in acute distress. The patient is well groomed. Full facial hair.  Head: Normocephalic, atraumatic.  Neck is supple. Mallampati  2 neck circumference:16.25 . Nasal airflow constricted , TMJ click evident . Retrognathia is  Not seen.  Cardiovascular:  Regular rate and rhythm, without  murmurs or carotid bruit, and without distended neck veins. Respiratory: Lungs are clear to auscultation. Skin:  Without evidence of edema, or rash Trunk: BMI is 26 . The patient's posture is erect   Neurologic exam : The patient is awake and alert, oriented to place and time.   Attention span & concentration ability appears normal.  Speech is fluent,  without  dysarthria, dysphonia or aphasia.  Mood and affect are appropriate.  Cranial nerves: Pupils are equal and briskly reactive to light. Funduscopic exam  without  evidence of pallor or edema.  Extraocular movements  in vertical and horizontal planes intact and without nystagmus. Visual fields by finger perimetry are intact. Hearing to finger rub intact. Facial sensation intact to fine touch. Facial motor strength is symmetric and tongue and uvula move midline. Shoulder shrug was symmetrical.   Motor exam:  Normal tone, muscle bulk and symmetric strength in all extremities. Sensory:  Fine touch, pinprick and vibration were tested in all extremities. Proprioception tested in the upper extremities was normal. Coordination: Rapid alternating movements in the fingers/hands was normal. Finger-to-nose maneuver  normal without evidence of ataxia, dysmetria or tremor. Gait and station: Patient walks without assistive device and is able unassisted to climb up to the exam table. Strength within normal limits.  Stance is stable and normal. Tandem gait is unfragmented. Deep tendon reflexes: in the upper and lower extremities are symmetric and intact. Babinski maneuver response is downgoing.  The patient was advised of the nature of the diagnosed sleep disorder , the treatment options and risks for general a health and wellness arising from not treating the condition.  I spent more than  35 minutes of face to face time with the patient. Greater than 50% of time was spent in counseling and coordination of care. We have discussed the diagnosis and differential and I answered the patient's questions.    I reviewed the patient's sleep study from [facility] sleep disorder center, performed on 06/04/2013 he had an AHI of 21.1 which places him in the moderate category. During sleep supine his AHI was 29 per hour of sleep, and in nonsupine 9.7 times per hour of sleep there was a strong positional component to his apnea. There are no central apneas noted, his heart rate stayed regular and in sinus rhythm throughout the night, oxygen nadir was 89%, typically not concerning and  he had 3.9 PLM arousals during the diagnostic type and 2.8 per hour during the titration to CPAP which ended with a pressure of only 6 cm water pressure. He responded apparently very well to this  rather low pressure and achieved a sleep time of 86 minutes under CPAP including 26.5 minutes of REM sleep.   Assessment:  After physical and neurologic examination, review of laboratory studies,  Personal review of imaging studies, reports of other /same  Imaging studies ,  Results of polysomnography/ neurophysiology testing and pre-existing records as far as provided in visit., my assessment is   1) Mr. Zamora has given clinical evidence making the presence of obstructive sleep apnea very likely. I suspect strongly that he has apnea but he is mainly bothered by his snoring and the lack of restorative for refreshing sleep quality. In addition his sleep may be interrupted by acid reflux, restless legs, and to some degree alcohol use.  He has restricted his caffeine intake to 0 but has not found that his sleep quality has improved that much. He remembered periods occasions whenhe didn't drink alcohol and he still felt that his sleep was not better in quality.  Overall he gets about 6 hours of nocturnal sleep of middling quality. He feels fatigued and sleepy and at its worst after lunch.  2) sleep study review let me to believe that he can do very well with a CPAP autotitration between 5 and 10 cm water , heated humidification and a mask of his choice. He did not like a full face mask as was used in his sleep study, and there should be no reason for him not to use a nasal pillow over and nasal covering mask.  3) He needs to reduce his alcohol intake to achieve better sleep. Sleep hygiene rules reviewed and explained. Print out given.     Plan:  Treatment plan and additional workup :  Mr. Buelna sleep study is 50 years old but his body mass index has not significantly changed, he is still in the 160 pound  body weight range. I will order an auto-titration for him, 5-10 cm water, no EPR. I like for the DME to fit him to a nasal pillow or nasal mask, such as a pillairo,  I gave him a ballpark estimate for the CPAP cost as he has a high deductible health plan. And he may hold off to January 2018 depending on the expected costs. I will also introduce into the tennis ball method to avoid supine sleep which accentuated his apnea significantly. I have further advised him to take ranitidine at night and not in the morning as it has sleepy making qualities.   Rv after 30 days of CPAP use.    Asencion Partridge Kenzi Bardwell MD  04/26/2016   CC: Janith Lima, Md [address] [facility] [address] [place], [place] [address]

## 2016-04-26 NOTE — Telephone Encounter (Signed)
rf rx for alprazolam. pls advise

## 2016-04-26 NOTE — Patient Instructions (Signed)

## 2016-04-26 NOTE — Progress Notes (Signed)
Order and cpap referral sent to [place]. Informed [facility] that pt may want to hold off on starting cpap until after the new year begins but [facility] should call them to discuss.

## 2016-04-26 NOTE — Telephone Encounter (Signed)
DONE

## 2016-04-27 NOTE — Telephone Encounter (Signed)
rx faxed to [facility].  

## 2016-05-12 ENCOUNTER — Encounter: Payer: Self-pay | Admitting: Internal Medicine

## 2016-05-12 ENCOUNTER — Other Ambulatory Visit: Payer: Self-pay | Admitting: Internal Medicine

## 2016-05-12 DIAGNOSIS — R3129 Other microscopic hematuria: Secondary | ICD-10-CM | POA: Insufficient documentation

## 2016-05-16 ENCOUNTER — Encounter: Payer: Self-pay | Admitting: Internal Medicine

## 2016-05-16 ENCOUNTER — Other Ambulatory Visit: Payer: Self-pay | Admitting: Internal Medicine

## 2016-05-16 ENCOUNTER — Other Ambulatory Visit (INDEPENDENT_AMBULATORY_CARE_PROVIDER_SITE_OTHER): Payer: BLUE CROSS/BLUE SHIELD

## 2016-05-16 DIAGNOSIS — E785 Hyperlipidemia, unspecified: Secondary | ICD-10-CM

## 2016-05-16 DIAGNOSIS — R3129 Other microscopic hematuria: Secondary | ICD-10-CM | POA: Diagnosis not present

## 2016-05-16 LAB — URINALYSIS, ROUTINE W REFLEX MICROSCOPIC
Bilirubin Urine: NEGATIVE
Hgb urine dipstick: NEGATIVE
Ketones, ur: NEGATIVE
Leukocytes, UA: NEGATIVE
Nitrite: NEGATIVE
RBC / HPF: NONE SEEN (ref 0–?)
Specific Gravity, Urine: >=1.03 — AB (ref 1.000–1.030)
Total Protein, Urine: NEGATIVE
Urine Glucose: NEGATIVE
Urobilinogen, UA: 0.2 (ref 0.0–1.0)
WBC, UA: NONE SEEN (ref 0–?)
pH: 6 (ref 5.0–8.0)

## 2016-05-17 LAB — GC/CHLAMYDIA PROBE AMP
CT Probe RNA: NOT DETECTED
GC Probe RNA: NOT DETECTED

## 2016-05-24 ENCOUNTER — Encounter: Payer: Self-pay | Admitting: Internal Medicine

## 2016-05-24 ENCOUNTER — Ambulatory Visit (INDEPENDENT_AMBULATORY_CARE_PROVIDER_SITE_OTHER): Payer: BLUE CROSS/BLUE SHIELD | Admitting: Internal Medicine

## 2016-05-24 VITALS — BP 138/90 | HR 86 | Temp 98.8°F | Ht 68.0 in | Wt 177.0 lb

## 2016-05-24 DIAGNOSIS — Z23 Encounter for immunization: Secondary | ICD-10-CM | POA: Diagnosis not present

## 2016-05-24 DIAGNOSIS — I10 Essential (primary) hypertension: Secondary | ICD-10-CM | POA: Diagnosis not present

## 2016-05-24 DIAGNOSIS — F411 Generalized anxiety disorder: Secondary | ICD-10-CM

## 2016-05-24 DIAGNOSIS — Z7251 High risk heterosexual behavior: Secondary | ICD-10-CM

## 2016-05-24 MED ORDER — EMTRICITABINE-TENOFOVIR DF 200-300 MG PO TABS: 1.0000 | ORAL_TABLET | Freq: Every day | ORAL | 3 refills | Status: DC

## 2016-05-24 NOTE — Patient Instructions (Signed)
Safe Sex Safe sex is about reducing the risk of giving or getting a sexually transmitted disease (STD). STDs are spread through sexual contact involving the genitals, mouth, or rectum. Some STDs can be cured and others cannot. Safe sex can also prevent unintended pregnancies.  WHAT ARE SOME SAFE SEX PRACTICES?  Limit your sexual activity to only one partner who is having sex with only you.  Talk to your partner about his or her past partners, past STDs, and drug use.  Use a condom every time you have sexual intercourse. This includes vaginal, oral, and anal sexual activity. Both females and males should wear condoms during oral sex. Only use latex or polyurethane condoms and water-based lubricants. Using petroleum-based lubricants or oils to lubricate a condom will weaken the condom and increase the chance that it will break. The condom should be in place from the beginning to the end of sexual activity. Wearing a condom reduces, but does not completely eliminate, your risk of getting or giving an STD. STDs can be spread by contact with infected body fluids and skin.  Get vaccinated for hepatitis B and HPV.  Avoid alcohol and recreational drugs, which can affect your judgment. You may forget to use a condom or participate in high-risk sex.  For females, avoid douching after sexual intercourse. Douching can spread an infection farther into the reproductive tract.  Check your body for signs of sores, blisters, rashes, or unusual discharge. See your health care provider if you notice any of these signs.  Avoid sexual contact if you have symptoms of an infection or are being treated for an STD. If you or your partner has herpes, avoid sexual contact when blisters are present. Use condoms at all other times.  If you are at risk of being infected with HIV, it is recommended that you take a prescription medicine daily to prevent HIV infection. This is called pre-exposure prophylaxis (PrEP). You are  considered at risk if:  You are a man who has sex with other men (MSM).  You are a heterosexual man or woman who is sexually active with more than one partner.  You take drugs by injection.  You are sexually active with a partner who has HIV.  Talk with your health care provider about whether you are at high risk of being infected with HIV. If you choose to begin PrEP, you should first be tested for HIV. You should then be tested every 3 months for as long as you are taking PrEP.  See your health care provider for regular screenings, exams, and tests for other STDs. Before having sex with a new partner, each of you should be screened for STDs and should talk about the results with each other. WHAT ARE THE BENEFITS OF SAFE SEX?   There is less chance of getting or giving an STD.  You can prevent unwanted or unintended pregnancies.  By discussing safe sex concerns with your partner, you may increase feelings of intimacy, comfort, trust, and honesty between the two of you. This information is not intended to replace advice given to you by your health care provider. Make sure you discuss any questions you have with your health care provider. Document Released: 07/07/2004 Document Revised: 06/20/2014 Document Reviewed: 04/19/2015 Elsevier Interactive Patient Education  2017 [employer].

## 2016-05-24 NOTE — Progress Notes (Signed)
Pre visit review using our clinic review tool, if applicable. No additional management support is needed unless otherwise documented below in the visit note. 

## 2016-05-24 NOTE — Progress Notes (Signed)
Subjective:  Patient ID: Caleb Obrien, male    DOB: 1965/07/06  Age: 50 y.o. 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 He tells me his blood pressure has been well controlled at home.  He wants to start PrEP. He has had several sexual partners over the last year and admits to having protected receptive and insertive anal intercourse and unprotected receptive and insertive oral intercourse. He tends to be more sexually active in the future.  Outpatient Medications Prior to Visit  Medication Sig Dispense Refill  . ALPRAZolam (XANAX) 0.25 MG tablet TAKE 1/2 TABLET BY MOUTH AS NEEDED 30 tablet 3  . aspirin EC 81 MG tablet Take 1 tablet (81 mg total) by mouth daily. 90 tablet 3  . atorvastatin (LIPITOR) 40 MG tablet TAKE ONE TABLET BY MOUTH DAILY 90 tablet 3  . buPROPion (WELLBUTRIN SR) 100 MG 12 hr tablet Take 1 tablet (100 mg total) by mouth 2 (two) times daily. 60 tablet 3  . cetirizine (ZYRTEC) 10 MG tablet TAKE 1 TABLET(10 MG) BY MOUTH DAILY 30 tablet 11  . irbesartan-hydrochlorothiazide (AVALIDE) 300-12.5 MG tablet Take 1 tablet by mouth daily. 90 tablet 1  . levalbuterol (XOPENEX HFA) 45 MCG/ACT inhaler Inhale 1-2 puffs into the lungs every 4 (four) hours as needed for wheezing. 1 Inhaler 3  . ranitidine (ZANTAC) 75 MG tablet Take 2 tablets (150 mg total) by mouth at bedtime. 60 tablet 1  . tadalafil (CIALIS) 5 MG tablet Take 1 tablet (5 mg total) by mouth daily as needed for erectile dysfunction. 10 tablet 5  . FLUoxetine (PROZAC) 20 MG tablet TAKE 1 TABLET EVERY DAY 340 tablet 1   No facility-administered medications prior to visit.     ROS Review of Systems  Constitutional: Negative.  Negative for appetite change, chills, diaphoresis, fatigue and fever.  HENT: Negative.  Negative for sore throat and trouble swallowing.   Eyes: Negative for visual disturbance.  Respiratory: Negative for cough, chest tightness, shortness of breath and  stridor.   Cardiovascular: Negative.  Negative for chest pain, palpitations and leg swelling.  Gastrointestinal: Negative for abdominal pain, blood in stool, constipation, diarrhea, nausea, rectal pain and vomiting.  Endocrine: Negative.   Genitourinary: Negative.  Negative for difficulty urinating, discharge, dysuria, genital sores, scrotal swelling and urgency.  Musculoskeletal: Negative.  Negative for back pain, myalgias and neck pain.  Skin: Negative.   Allergic/Immunologic: Negative.   Neurological: Negative.   Hematological: Negative.  Negative for adenopathy. Does not bruise/bleed easily.  Psychiatric/Behavioral: Negative for decreased concentration, dysphoric mood, sleep disturbance and suicidal ideas. The patient is nervous/anxious.     Objective:  BP 138/90 (BP Location: Left Arm, Patient Position: Sitting, Cuff Size: Normal)   Pulse 86   Temp 98.8 F (37.1 C) (Oral)   Ht 5\' 8"  (1.727 m)   Wt 177 lb (80.3 kg)   SpO2 97%   BMI 26.91 kg/m   BP Readings from Last 3 Encounters:  05/24/16 138/90  04/26/16 125/87  04/12/16 (!) 150/100    Wt Readings from Last 3 Encounters:  05/24/16 177 lb (80.3 kg)  04/26/16 170 lb (77.1 kg)  04/12/16 174 lb 8 oz (79.2 kg)    Physical Exam  Constitutional: He is oriented to person, place, and time. No distress.  HENT:  Mouth/Throat: Oropharynx is clear and moist. No oropharyngeal exudate.  Eyes: Conjunctivae are normal. Right eye exhibits no discharge. Left eye exhibits no discharge. No  scleral icterus.  Neck: Normal range of motion. Neck supple. No JVD present. No tracheal deviation present. No thyromegaly present.  Cardiovascular: Normal rate, regular rhythm, normal heart sounds and intact distal pulses.  Exam reveals no gallop and no friction rub.   No murmur heard. Pulmonary/Chest: Effort normal and breath sounds normal. No stridor. No respiratory distress. He has no wheezes. He has no rales. He exhibits no tenderness.    Abdominal: Soft. Bowel sounds are normal. He exhibits no distension and no mass. There is no tenderness. There is no rebound and no guarding.  Musculoskeletal: Normal range of motion. He exhibits no edema, tenderness or deformity.  Lymphadenopathy:    He has no cervical adenopathy.  Neurological: He is oriented to person, place, and time.  Skin: Skin is warm and dry. No rash noted. He is not diaphoretic. No erythema. No pallor.  Vitals reviewed.   Lab Results  Component Value Date   WBC 8.8 04/12/2016   HGB 16.2 04/12/2016   HCT 46.9 04/12/2016   PLT 359.0 04/12/2016   GLUCOSE 94 04/12/2016   CHOL 176 04/12/2016   TRIG 97.0 04/12/2016   HDL 56.30 04/12/2016   LDLDIRECT 144.4 01/27/2012   LDLCALC 101 (H) 04/12/2016   ALT 42 04/12/2016   AST 28 04/12/2016   NA 141 04/12/2016   K 3.9 04/12/2016   CL 103 04/12/2016   CREATININE 0.95 04/12/2016   BUN 15 04/12/2016   CO2 28 04/12/2016   TSH 3.03 04/12/2016   PSA 0.91 04/12/2016    Dg Ribs Unilateral W/chest Left  Result Date: 04/06/2016 CLINICAL DATA:  Fall 4 days ago with left-sided chest pain EXAM: LEFT RIBS AND CHEST - 3+ VIEW COMPARISON:  01/24/2014 FINDINGS: Cardiac shadow is within normal limits. The lungs are clear bilaterally. No pneumothorax or sizable effusion is noted. Minimal irregularity of the anterior aspect of the left sixth rib is noted which may represent an undisplaced fracture. No other fractures are noted. IMPRESSION: Irregularity of the anterior aspect of the left sixth rib which may represent an undisplaced fracture. Electronically Signed   By: Inez Catalina M.D.   On: 04/06/2016 15:14    Assessment & Plan:   Raun was seen today for hypertension.  Diagnoses and all orders for this visit:  Essential hypertension, benign- his blood pressure is well-controlled on the combination of an ARB and thiazide diuretic.  High risk sexual behavior- he was recently negative for HIV, syphilis, Hep B, and other STDs.  He has normal renal function. Will start Truvada -     emtricitabine-tenofovir (TRUVADA) 200-300 MG tablet; Take 1 tablet by mouth daily.  Need for prophylactic vaccination and inoculation against viral hepatitis- his recent hepatitis B surface antibody was indeterminate. He is high risk so will revaccinate him against hepatitis B. -     Hepatitis B vaccine adult IM  GAD (generalized anxiety disorder)   I have discontinued Mr. Caswell's FLUoxetine. I am also having him start on emtricitabine-tenofovir. Additionally, I am having him maintain his levalbuterol, ranitidine, cetirizine, tadalafil, buPROPion, irbesartan-hydrochlorothiazide, aspirin EC, ALPRAZolam, and atorvastatin.  Meds ordered this encounter  Medications  . emtricitabine-tenofovir (TRUVADA) 200-300 MG tablet    Sig: Take 1 tablet by mouth daily.    Dispense:  30 tablet    Refill:  3     Follow-up: Return in about 4 weeks (around 06/21/2016).  Scarlette Calico, MD

## 2016-06-12 ENCOUNTER — Other Ambulatory Visit: Payer: Self-pay | Admitting: Internal Medicine

## 2016-06-12 DIAGNOSIS — F411 Generalized anxiety disorder: Secondary | ICD-10-CM

## 2016-06-15 ENCOUNTER — Encounter: Payer: Self-pay | Admitting: Internal Medicine

## 2016-06-15 ENCOUNTER — Other Ambulatory Visit: Payer: Self-pay | Admitting: Internal Medicine

## 2016-06-16 ENCOUNTER — Other Ambulatory Visit: Payer: Self-pay | Admitting: Internal Medicine

## 2016-06-16 DIAGNOSIS — I10 Essential (primary) hypertension: Secondary | ICD-10-CM

## 2016-06-16 MED ORDER — IRBESARTAN 300 MG PO TABS: 300.0000 mg | ORAL_TABLET | Freq: Every day | ORAL | 1 refills | Status: DC

## 2016-06-24 ENCOUNTER — Ambulatory Visit (INDEPENDENT_AMBULATORY_CARE_PROVIDER_SITE_OTHER): Payer: BLUE CROSS/BLUE SHIELD

## 2016-06-24 DIAGNOSIS — Z23 Encounter for immunization: Secondary | ICD-10-CM

## 2016-07-11 DIAGNOSIS — G4733 Obstructive sleep apnea (adult) (pediatric): Secondary | ICD-10-CM | POA: Diagnosis not present

## 2016-08-09 ENCOUNTER — Ambulatory Visit (INDEPENDENT_AMBULATORY_CARE_PROVIDER_SITE_OTHER): Payer: BLUE CROSS/BLUE SHIELD | Admitting: Neurology

## 2016-08-09 ENCOUNTER — Encounter: Payer: Self-pay | Admitting: Neurology

## 2016-08-09 VITALS — BP 114/66 | HR 88 | Resp 16 | Ht 68.0 in | Wt 175.0 lb

## 2016-08-09 DIAGNOSIS — Z9989 Dependence on other enabling machines and devices: Secondary | ICD-10-CM | POA: Diagnosis not present

## 2016-08-09 DIAGNOSIS — G4733 Obstructive sleep apnea (adult) (pediatric): Secondary | ICD-10-CM

## 2016-08-09 NOTE — Progress Notes (Signed)
SLEEP MEDICINE CLINIC   Provider:  Larey Obrien, M D  Referring Provider: Janith Lima, MD Primary Care Physician:  Caleb Calico, MD  Chief Complaint  Patient presents with  . Follow-up    Rm 11. Patient states that he is doing well with CPAP and loves using it. DME: [facility]    HPI:  Caleb Obrien is a 51 y.o. male , seen here as a referral  from Dr. Ronnald Obrien for a possible sleep study,  Caleb Obrien underwent a sleep study attended in [place] about 3 years ago and afterwards was referred to for dental treatment with a mandibular advancement device. His claim was denied by his health insurance and resulted in a lot of costs to him. He never had the device made. He has used a dental device that he bought on the Internet, but he is not sure that it has helped. He is known to be a loud snorer and his snoring may not have been reduced at all as he describes, he also reports that he had choking and apnea attacks waking him out of sleep and these have continued while he used a new device. Snoring has been ongoing as has choking . He has not had a good night sleep, not refreshing nor of restoring quality. He is excessivly sleepy while driving, has tried a lot of caffeine through out the day and scheduled stops - with minimal success. His overnight sleep deteriorated further while using caffeine.  Sleep habits are as follows: Usually goes to bed by 11 PM and falls asleep within 30 minutes or less. Bedroom is cool, quiet and dark. He is going to sleep usually on his sides but wakes  on his back more often.  He suffers from GERD which also makes sleep sometimes less pleasant. He takes ranitidine over-the-counter in AM ! He props his head with 2 pillows, but he has continued to notice heavy snoring and wakes up from choking and apneic spells. He has allergic airway disease, but stopped antihistamines. His nose is frequently runny.   He does not have shortness of breath, palpitations, dependency  edema, and has not experienced any fainting spells.  He does not have orthostasis, sometimes has a mild headache with elevated blood pressures, he does wake up clammy and diaphoretic however. Usually he has one nocturnal bathroom break and wakes with the urge of urination. He usually does not wake up with headaches in the morning. He does not have a dry mouth in the morning, and she does not drink water during the night. Wakes up at 5.30 by alarm, read the news in bed than rises. He has not longer taken caffeine in AM.    I reviewed the patient's sleep study from [facility] sleep [facility] center, performed on 06/04/2013 he had an AHI of 21.1 which places him in the moderate category. During sleep supine his AHI was 29 per hour of sleep, and in nonsupine 9.7 times per hour of sleep there was a strong positional component to his apnea. There are no central apneas noted, his heart rate stayed regular and in sinus rhythm throughout the night, oxygen nadir was 89%, typically not concerning and he had 3.9 PLM arousals during the diagnostic type and 2.8 per hour during the titration to CPAP which ended with a pressure of only 6 cm water pressure. He responded apparently very well to this rather low pressure and achieved a sleep time of 86 minutes under CPAP including 26.5 minutes of  REM sleep. Sleep medical history and family sleep history: Mother has OSA and uses CPAP.  Caleb Obrien has a history of hypertension, hypercholesterolemia, depression with more anxiety than depression manifestation, he very rarely takes Xanax which was prescribed for anxiety attacks, he has Lipitor, Wellbutrin, Zyrtec as needed, Xopenex  Inhaler- as needed for wheezing, Zantac, Cialis, Ibusartin.   Social history:   Caffeine - not longer using, ETOH weekends and week days, daily- liquor , 2 glasses, he quit smoking just 3 weeks ago and used to smoke two cigs a day.  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 He is single,  one child ( son, 59 ). Caleb Obrien sleep study is 28 years old but his body mass index has not significantly changed, he is still in the 160 pound body weight range. I will order an auto-titration for him, 5-10 cm water, no EPR. I like for the DME to fit him to a nasal pillow or nasal mask, such as a pillairo,  I gave him a ballpark estimate for the CPAP cost as he has a high deductible health plan. And he may hold off to January 2018 depending on the expected costs. I will also introduce into the tennis ball method to avoid supine sleep which accentuated his apnea significantly. I have further advised him to take ranitidine at night and not in the morning as it has sleepy making qualities.  Interval history from 08/09/2016, Caleb Obrien is here for his first follow-up after O2 titration. He has been using the machine 27 out of 30 days over 4 hours, achieving at 93% compliance which is excellent. Average daily use a time is 6 hours and 40 minutes the AutoSet is set between 5 and 10 cm water with 3 cm EPR and reduced his AH I, also known as apnea hypopnea index, to 2.6. There are no major air leaks, and nasal pillows have been comfortable, the patient had no problem to sleep using CPAP. His current Epworth Sleepiness Scale is endorsed at 5 points, fatigue severity scale is endorsed at 20 points.    Review of Systems: Out of a complete 14 system review, the patient complains of only the following symptoms, and all other reviewed systems are negative. He has recently had some scary experiences where he felt overwhelmed by the need to sleep while driving. He has been known to snore, he reports sleep choking, witnessed snoring, excessive daytime sleepiness, fatigue.  currently treated for anxiety and depression.    Social History   Social History  . Marital status: Single    Spouse name: N/A  . Number of children:  N/A  . Years of education: N/A   Occupational History  . [place] History Main Topics  . Smoking status: Light Tobacco Smoker    Packs/day: 0.30    Types: Cigarettes  . Smokeless tobacco: Never Used  . Alcohol use 1.5 oz/week    3 drink(s) per week     Comment: 2 beverages daily  . Drug use: No  . Sexual activity: Yes   Other Topics Concern  . Not on file   Social History Narrative  . No narrative on file    Family History  Problem Relation Age of Onset  . Hypertension Mother   . Stroke Mother   . Arthritis Mother   . Prostate cancer  Father     Past Medical History:  Diagnosis Date  . ALLERGIC RHINITIS CAUSE UNSPECIFIED 02/24/2009  . ASTHMA 02/05/2007  . BENIGN PROSTATIC HYPERTROPHY, WITH URINARY OBSTRUCTION 05/22/2007  . Dermatophytosis of foot 11/02/2009  . GANGLION CYST, WRIST, LEFT 03/25/2009  . Hemangioma of skin and subcutaneous tissue 06/30/2009  . HEPATITIS B, HX OF 02/05/2007  . [place], Faith 04/21/2008  . PLANTAR WART, RIGHT 01/23/2009    Past Surgical History:  Procedure Laterality Date  . WISDOM TOOTH EXTRACTION      Current Outpatient Prescriptions  Medication Sig Dispense Refill  . ALPRAZolam (XANAX) 0.25 MG tablet TAKE 1/2 TABLET BY MOUTH AS NEEDED 30 tablet 3  . aspirin EC 81 MG tablet Take 1 tablet (81 mg total) by mouth daily. 90 tablet 3  . atorvastatin (LIPITOR) 40 MG tablet TAKE ONE TABLET BY MOUTH DAILY 90 tablet 3  . buPROPion (WELLBUTRIN SR) 100 MG 12 hr tablet TAKE ONE TABLET BY MOUTH TWICE A DAY 60 tablet 11  . cetirizine (ZYRTEC) 10 MG tablet TAKE 1 TABLET(10 MG) BY MOUTH DAILY 30 tablet 11  . emtricitabine-tenofovir (TRUVADA) 200-300 MG tablet Take 1 tablet by mouth daily. 30 tablet 3  . irbesartan (AVAPRO) 300 MG tablet Take 1 tablet (300 mg total) by mouth daily. 90 tablet 1  . levalbuterol (XOPENEX HFA) 45 MCG/ACT inhaler Inhale 1-2 puffs into the lungs every 4 (four) hours as needed for  wheezing. 1 Inhaler 3  . ranitidine (ZANTAC) 75 MG tablet Take 2 tablets (150 mg total) by mouth at bedtime. 60 tablet 1  . tadalafil (CIALIS) 5 MG tablet Take 1 tablet (5 mg total) by mouth daily as needed for erectile dysfunction. 10 tablet 5   No current facility-administered medications for this visit.     Allergies as of 08/09/2016 - Review Complete 08/09/2016  Allergen Reaction Noted  . Amlodipine  02/07/2014  . Lisinopril  02/07/2014  . Sudafed [pseudoephedrine hcl] Other (See Comments) 11/26/2014    Vitals: BP 114/66   Pulse 88   Resp 16   Ht 5\' 8"  (1.727 m)   Wt 175 lb (79.4 kg)   BMI 26.61 kg/m  Last Weight:  Wt Readings from Last 1 Encounters:  08/09/16 175 lb (79.4 kg)   [id number] mass index is 26.61 kg/m.     Last Height:   Ht Readings from Last 1 Encounters:  08/09/16 5\' 8"  (1.727 m)    Physical exam:  General: The patient is awake, alert and appears not in acute distress. The patient is well groomed. Full facial hair.  Head: Normocephalic, atraumatic.  Neck is supple. Mallampati  2 neck circumference:16.25 . Nasal airflow constricted , TMJ click evident . Retrognathia is  Not seen.  Cardiovascular:  Regular rate and rhythm, without  murmurs or carotid bruit, and without distended neck veins. Respiratory: Lungs are clear to auscultation. Skin:  Without evidence of edema, or rash Trunk: BMI is 26 . The patient's posture is erect   Neurologic exam : The patient is awake and alert, oriented to place and time.   Attention span & concentration ability appears normal.  Speech is fluent,  without  dysarthria, dysphonia or aphasia.  Mood and affect are appropriate.  Cranial nerves: Pupils are equal and briskly reactive to light. Extraocular movements  in vertical and horizontal planes intact and without nystagmus. Visual fields by finger perimetry are intact. Hearing to finger rub intact. Facial sensation intact to fine touch. Facial motor strength is symmetric  and  tongue and uvula move midline. Shoulder shrug was symmetrical.   The patient was advised of the nature of the diagnosed sleep disorder ( OSA ) , the treatment options and risks for general a health and wellness arising from not treating the condition.  I spent more than  15 minutes of face to face time with the patient. Greater than 50% of time was spent in counseling and coordination of care. We have discussed the diagnosis and differential and I answered the patient's questions.      Dear Gershon Mussel,  Thank you again for sending this patient my way.  I hope you continue to be happy with our services.   After physical and neurologic examination, review of laboratory studies,  Personal review of imaging studies, reports of other /same  Imaging studies ,  Results of polysomnography/ neurophysiology testing and pre-existing records as far as provided in visit., my assessment is - OSA succesfully treated on CPAP, compliance is high.   Continue CPAP auto titration 5-10 cm water 3 cm EPR, nasal pillows, [facility]. DME .  Rv in 87 month   Asencion Partridge Diangelo Radel MD  08/09/2016   CC: Caleb Lima, Md [address] [facility] [address] [place], [place] [address]

## 2016-08-24 ENCOUNTER — Encounter: Payer: Self-pay | Admitting: Internal Medicine

## 2016-09-08 ENCOUNTER — Ambulatory Visit (INDEPENDENT_AMBULATORY_CARE_PROVIDER_SITE_OTHER): Payer: BLUE CROSS/BLUE SHIELD | Admitting: Internal Medicine

## 2016-09-08 ENCOUNTER — Encounter: Payer: Self-pay | Admitting: Internal Medicine

## 2016-09-08 ENCOUNTER — Other Ambulatory Visit (INDEPENDENT_AMBULATORY_CARE_PROVIDER_SITE_OTHER): Payer: BLUE CROSS/BLUE SHIELD

## 2016-09-08 VITALS — BP 140/90 | HR 91 | Temp 99.1°F | Resp 16 | Ht 68.0 in | Wt 178.0 lb

## 2016-09-08 DIAGNOSIS — I10 Essential (primary) hypertension: Secondary | ICD-10-CM | POA: Diagnosis not present

## 2016-09-08 DIAGNOSIS — Z7251 High risk heterosexual behavior: Secondary | ICD-10-CM | POA: Diagnosis not present

## 2016-09-08 DIAGNOSIS — G63 Polyneuropathy in diseases classified elsewhere: Secondary | ICD-10-CM

## 2016-09-08 DIAGNOSIS — M792 Neuralgia and neuritis, unspecified: Secondary | ICD-10-CM

## 2016-09-08 DIAGNOSIS — G5790 Unspecified mononeuropathy of unspecified lower limb: Secondary | ICD-10-CM

## 2016-09-08 DIAGNOSIS — E538 Deficiency of other specified B group vitamins: Secondary | ICD-10-CM

## 2016-09-08 LAB — URINALYSIS, ROUTINE W REFLEX MICROSCOPIC
Bilirubin Urine: NEGATIVE
Hgb urine dipstick: NEGATIVE
Ketones, ur: NEGATIVE
Leukocytes, UA: NEGATIVE
Nitrite: NEGATIVE
RBC / HPF: NONE SEEN (ref 0–?)
Specific Gravity, Urine: 1.01 (ref 1.000–1.030)
Total Protein, Urine: NEGATIVE
Urine Glucose: NEGATIVE
Urobilinogen, UA: 0.2 (ref 0.0–1.0)
pH: 6.5 (ref 5.0–8.0)

## 2016-09-08 LAB — VITAMIN B12: Vitamin B-12: 246 pg/mL (ref 211–911)

## 2016-09-08 LAB — BASIC METABOLIC PANEL
BUN: 11 mg/dL (ref 6–23)
CO2: 30 mEq/L (ref 19–32)
Calcium: 9.1 mg/dL (ref 8.4–10.5)
Chloride: 105 mEq/L (ref 96–112)
Creatinine, Ser: 1.19 mg/dL (ref 0.40–1.50)
GFR: 69.06 mL/min (ref >60.00)
Glucose, Bld: 82 mg/dL (ref 70–99)
Potassium: 4.1 mEq/L (ref 3.5–5.1)
Sodium: 141 mEq/L (ref 135–145)

## 2016-09-08 LAB — FOLATE: Folate: 6.2 ng/mL (ref >5.9)

## 2016-09-08 MED ORDER — CYANOCOBALAMIN 1000 MCG/ML IJ SOLN
1000.0000 ug | Freq: Once | INTRAMUSCULAR | Status: AC
  Administered 2016-09-08: 1000 ug via INTRAMUSCULAR

## 2016-09-08 NOTE — Progress Notes (Signed)
Pre visit review using our clinic review tool, if applicable. No additional management support is needed unless otherwise documented below in the visit note. 

## 2016-09-08 NOTE — Progress Notes (Signed)
Subjective:  Patient ID: Caleb Obrien, male    DOB: 14-May-1966  Age: 51 y.o. MRN: [id number]  CC: Hypertension   HPI Caleb Obrien presents for A blood pressure check. He complains over the last month of a prickly sensation over both thighs. When it started it was symmetrical but now it is more prominent on the right than the left. He doesn't describe it as numbness, weakness, or tingling. He tells me that the area feels irritated and that clothing makes it feel worse. He feels the urge to rub the area to make the symptoms go away. He offers no other complaints today.  He continues to be sexually active and considers his sexual activity to be relatively high risk. He is doing well on Truvada.  Outpatient Medications Prior to Visit  Medication Sig Dispense Refill  . ALPRAZolam (XANAX) 0.25 MG tablet TAKE 1/2 TABLET BY MOUTH AS NEEDED 30 tablet 3  . aspirin EC 81 MG tablet Take 1 tablet (81 mg total) by mouth daily. 90 tablet 3  . atorvastatin (LIPITOR) 40 MG tablet TAKE ONE TABLET BY MOUTH DAILY 90 tablet 3  . buPROPion (WELLBUTRIN SR) 100 MG 12 hr tablet TAKE ONE TABLET BY MOUTH TWICE A DAY 60 tablet 11  . cetirizine (ZYRTEC) 10 MG tablet TAKE 1 TABLET(10 MG) BY MOUTH DAILY 30 tablet 11  . emtricitabine-tenofovir (TRUVADA) 200-300 MG tablet Take 1 tablet by mouth daily. 30 tablet 3  . irbesartan (AVAPRO) 300 MG tablet Take 1 tablet (300 mg total) by mouth daily. 90 tablet 1  . levalbuterol (XOPENEX HFA) 45 MCG/ACT inhaler Inhale 1-2 puffs into the lungs every 4 (four) hours as needed for wheezing. 1 Inhaler 3  . ranitidine (ZANTAC) 75 MG tablet Take 2 tablets (150 mg total) by mouth at bedtime. 60 tablet 1  . tadalafil (CIALIS) 5 MG tablet Take 1 tablet (5 mg total) by mouth daily as needed for erectile dysfunction. 10 tablet 5   No facility-administered medications prior to visit.     ROS Review of Systems  Constitutional: Negative.  Negative for diaphoresis and fatigue.    HENT: Negative.  Negative for trouble swallowing.   Eyes: Negative for visual disturbance.  Respiratory: Negative for cough, chest tightness, shortness of breath and wheezing.   Cardiovascular: Negative for chest pain, palpitations and leg swelling.  Gastrointestinal: Negative for abdominal pain, constipation, diarrhea, nausea and vomiting.  Endocrine: Negative.   Genitourinary: Negative.  Negative for difficulty urinating, discharge, dysuria, genital sores, hematuria, penile pain, penile swelling, scrotal swelling, testicular pain and urgency.  Musculoskeletal: Negative.  Negative for arthralgias, back pain, joint swelling, myalgias and neck stiffness.  Skin: Negative.  Negative for rash.  Allergic/Immunologic: Negative.   Neurological: Negative.   Hematological: Negative.  Negative for adenopathy. Does not bruise/bleed easily.  Psychiatric/Behavioral: Negative.     Objective:  BP 140/90 (BP Location: Left Arm, Patient Position: Sitting, Cuff Size: Normal)   Pulse 91   Temp 99.1 F (37.3 C) (Oral)   Resp 16   Ht 5\' 8"  (1.727 m)   Wt 178 lb 0.6 oz (80.8 kg)   SpO2 97%   BMI 27.07 kg/m   BP Readings from Last 3 Encounters:  09/08/16 140/90  08/09/16 114/66  05/24/16 138/90    Wt Readings from Last 3 Encounters:  09/08/16 178 lb 0.6 oz (80.8 kg)  08/09/16 175 lb (79.4 kg)  05/24/16 177 lb (80.3 kg)    Physical Exam  Constitutional: He is oriented  to person, place, and time. No distress.  HENT:  Mouth/Throat: Oropharynx is clear and moist. No oropharyngeal exudate.  Eyes: Conjunctivae are normal. Right eye exhibits no discharge. Left eye exhibits no discharge. No scleral icterus.  Neck: Normal range of motion. Neck supple. No JVD present. No tracheal deviation present. No thyromegaly present.  Cardiovascular: Normal rate, regular rhythm, normal heart sounds and intact distal pulses.  Exam reveals no gallop and no friction rub.   No murmur heard. Pulmonary/Chest: Effort  normal and breath sounds normal. No stridor. No respiratory distress. He has no wheezes. He has no rales. He exhibits no tenderness.  Abdominal: Soft. Bowel sounds are normal. He exhibits no distension and no mass. There is no tenderness. There is no rebound and no guarding.  Musculoskeletal: Normal range of motion. He exhibits no edema, tenderness or deformity.       Right upper leg: Normal. He exhibits no tenderness, no bony tenderness, no swelling, no edema and no deformity.       Left upper leg: Normal. He exhibits no tenderness, no bony tenderness, no swelling, no edema and no deformity.  Lymphadenopathy:    He has no cervical adenopathy.  Neurological: He is oriented to person, place, and time. He has normal strength. He displays no atrophy, no tremor and normal reflexes. No cranial nerve deficit or sensory deficit. He exhibits normal muscle tone. He displays a negative Romberg sign. He displays no seizure activity. Coordination and gait normal.  Reflex Scores:      Tricep reflexes are 1+ on the right side and 1+ on the left side.      Bicep reflexes are 1+ on the right side and 1+ on the left side.      Brachioradialis reflexes are 1+ on the right side and 1+ on the left side.      Patellar reflexes are 1+ on the right side and 1+ on the left side.      Achilles reflexes are 0 on the right side and 0 on the left side. There are diminished reflexes in both Achilles  Skin: Skin is warm and dry. No rash noted. He is not diaphoretic. No erythema. No pallor.  Vitals reviewed.   Lab Results  Component Value Date   WBC 8.8 04/12/2016   HGB 16.2 04/12/2016   HCT 46.9 04/12/2016   PLT 359.0 04/12/2016   GLUCOSE 82 09/08/2016   CHOL 176 04/12/2016   TRIG 97.0 04/12/2016   HDL 56.30 04/12/2016   LDLDIRECT 144.4 01/27/2012   LDLCALC 101 (H) 04/12/2016   ALT 42 04/12/2016   AST 28 04/12/2016   NA 141 09/08/2016   K 4.1 09/08/2016   CL 105 09/08/2016   CREATININE 1.19 09/08/2016   BUN  11 09/08/2016   CO2 30 09/08/2016   TSH 3.03 04/12/2016   PSA 0.91 04/12/2016    Dg Ribs Unilateral W/chest Left  Result Date: 04/06/2016 CLINICAL DATA:  Fall 4 days ago with left-sided chest pain EXAM: LEFT RIBS AND CHEST - 3+ VIEW COMPARISON:  01/24/2014 FINDINGS: Cardiac shadow is within normal limits. The lungs are clear bilaterally. No pneumothorax or sizable effusion is noted. Minimal irregularity of the anterior aspect of the left sixth rib is noted which may represent an undisplaced fracture. No other fractures are noted. IMPRESSION: Irregularity of the anterior aspect of the left sixth rib which may represent an undisplaced fracture. Electronically Signed   By: Inez Catalina M.D.   On: 04/06/2016 15:14  Assessment & Plan:   Nikesh was seen today for hypertension.  Diagnoses and all orders for this visit:  High risk sexual behavior- He continues to be negative for HIV and hepatitis B and has normal renal function so he can continue Truvada -     GC/Chlamydia Probe Amp; Future -     RPR; Future -     HIV antibody; Future -     Hepatitis B surface antigen; Future  Essential hypertension, benign- his blood pressure is adequately well controlled, electrolytes and renal function are normal -     Basic metabolic panel; Future -     Urinalysis, Routine w reflex microscopic; Future  Neuralgia of lower extremity, unspecified laterality- I've asked him to undergo a NCS/EMG to screen for neuropathy and myopathy. His B12 level is borderline low so I'm concerned this may be contributing to his symptoms. He started B12 replacement therapy today. -     Vitamin B12; Future -     Folate; Future -     Ambulatory referral to Neurology  Vitamin B12 deficiency neuropathy ([place])- after his injection today I have asked him to get another injection weekly for 2 more weeks and then monthly thereafter. I think treating this deficiency will help mitigate the symptoms. -     cyanocobalamin ((VITAMIN  B-12)) injection 1,000 mcg; Inject 1 mL (1,000 mcg total) into the muscle once.   I am having Mr. Mozer maintain his levalbuterol, ranitidine, cetirizine, tadalafil, aspirin EC, ALPRAZolam, atorvastatin, emtricitabine-tenofovir, buPROPion, and irbesartan. We administered cyanocobalamin.  Meds ordered this encounter  Medications  . cyanocobalamin ((VITAMIN B-12)) injection 1,000 mcg     Follow-up: Return in about 4 months (around 01/08/2017).  Scarlette Calico, MD

## 2016-09-08 NOTE — Patient Instructions (Signed)

## 2016-09-09 ENCOUNTER — Encounter: Payer: Self-pay | Admitting: Internal Medicine

## 2016-09-09 LAB — RPR

## 2016-09-09 LAB — HIV ANTIBODY (ROUTINE TESTING W REFLEX): HIV 1&2 Ab, 4th Generation: NONREACTIVE

## 2016-09-09 LAB — GC/CHLAMYDIA PROBE AMP
CT Probe RNA: NOT DETECTED
GC Probe RNA: NOT DETECTED

## 2016-09-09 LAB — HEPATITIS B SURFACE ANTIGEN: Hepatitis B Surface Ag: NEGATIVE

## 2016-09-14 ENCOUNTER — Telehealth: Payer: Self-pay

## 2016-09-14 ENCOUNTER — Ambulatory Visit (INDEPENDENT_AMBULATORY_CARE_PROVIDER_SITE_OTHER): Payer: BLUE CROSS/BLUE SHIELD

## 2016-09-14 DIAGNOSIS — E538 Deficiency of other specified B group vitamins: Secondary | ICD-10-CM

## 2016-09-14 MED ORDER — CYANOCOBALAMIN 1000 MCG/ML IJ SOLN
1000.0000 ug | Freq: Once | INTRAMUSCULAR | Status: AC
  Administered 2016-09-14: 1000 ug via INTRAMUSCULAR

## 2016-09-14 NOTE — Telephone Encounter (Signed)
Routing to dr Ronnald Ramp, how often should this patient come for b12 injections

## 2016-09-14 NOTE — Telephone Encounter (Signed)
My chart message sent to patient with dr Ronnald Ramp instructions per patient request

## 2016-09-14 NOTE — Telephone Encounter (Signed)
As I told him last week Weekly for 3 weeks and then monthly

## 2016-09-15 ENCOUNTER — Other Ambulatory Visit: Payer: Self-pay | Admitting: Internal Medicine

## 2016-09-15 DIAGNOSIS — Z7251 High risk heterosexual behavior: Secondary | ICD-10-CM

## 2016-09-17 DIAGNOSIS — R300 Dysuria: Secondary | ICD-10-CM | POA: Diagnosis not present

## 2016-09-19 ENCOUNTER — Encounter: Payer: Self-pay | Admitting: Neurology

## 2016-09-19 ENCOUNTER — Other Ambulatory Visit: Payer: Self-pay | Admitting: *Deleted

## 2016-09-19 DIAGNOSIS — M792 Neuralgia and neuritis, unspecified: Secondary | ICD-10-CM

## 2016-09-23 ENCOUNTER — Ambulatory Visit (INDEPENDENT_AMBULATORY_CARE_PROVIDER_SITE_OTHER): Payer: BLUE CROSS/BLUE SHIELD

## 2016-09-23 DIAGNOSIS — E538 Deficiency of other specified B group vitamins: Secondary | ICD-10-CM | POA: Diagnosis not present

## 2016-09-23 MED ORDER — CYANOCOBALAMIN 1000 MCG/ML IJ SOLN
1000.0000 ug | Freq: Once | INTRAMUSCULAR | Status: AC
  Administered 2016-09-23: 1000 ug via INTRAMUSCULAR

## 2016-09-27 ENCOUNTER — Encounter: Payer: Self-pay | Admitting: Internal Medicine

## 2016-09-27 ENCOUNTER — Ambulatory Visit (INDEPENDENT_AMBULATORY_CARE_PROVIDER_SITE_OTHER): Payer: BLUE CROSS/BLUE SHIELD | Admitting: Neurology

## 2016-09-27 DIAGNOSIS — M792 Neuralgia and neuritis, unspecified: Secondary | ICD-10-CM

## 2016-09-27 DIAGNOSIS — G5790 Unspecified mononeuropathy of unspecified lower limb: Secondary | ICD-10-CM

## 2016-09-27 NOTE — Procedures (Signed)
[facility]  [place]  [place], [place] [address] Tel: [phone] Fax:  [phone] Test Date:  09/27/2016  Patient: Caleb Obrien DOB: Jul 22, 1965 Physician: Narda Amber, DO  Sex: Male Height: 5\' 8"  Ref Phys: Scarlette Calico, M.D.  ID#: [id number] Temp: 35.8C Technician:    Patient Complaints: This is a 51 year-old gentleman referred for evaluation of bilateral thigh numbness.  NCV & EMG Findings: Extensive electrodiagnostic testing of the right lower extremity and additional studies of the left shows:  1. Bilateral sural and superficial peroneal sensory responses are within normal limits. 2. Bilateral peroneal and tibial motor responses are within normal limits. 3. Right tibial H reflex study is within normal limits.  4. There is no evidence of active or chronic motor axon loss changes affecting any of the tested muscles.  Motor unit configuration and recruitment pattern is within normal limits.   Impression: This is a normal study of the lower extremities. In particular, there is no evidence of a sensorimotor polyneuropathy or lumbosacral radiculopathy.   ___________________________ Narda Amber, DO    Nerve Conduction Studies Anti Sensory Summary Table   Stim Site NR Peak (ms) Norm Peak (ms) P-T Amp (V) Norm P-T Amp  Left Sup Peroneal Anti Sensory (Ant Lat Mall)  35.8C  12 cm    2.8 <4.5 16.0 >5  Right Sup Peroneal Anti Sensory (Ant Lat Mall)  35.8C  12 cm    2.3 <4.5 16.7 >5  Left Sural Anti Sensory (Lat Mall)  35.8C  Calf    4.1 <4.5 10.0 >5  Right Sural Anti Sensory (Lat Mall)  35.8C  Calf    4.4 <4.5 8.8 >5   Motor Summary Table   Stim Site NR Onset (ms) Norm Onset (ms) O-P Amp (mV) Norm O-P Amp Site1 Site2 Delta-0 (ms) Dist (cm) Vel (m/s) Norm Vel (m/s)  Left Peroneal Motor (Ext Dig Brev)  35.8C  Ankle    4.5 <5.5 5.1 >3 B Fib Ankle 6.7 33.0 49 >40  B Fib    11.2  4.5  Poplt B Fib 1.1 10.0 91 >40  Poplt    12.3  4.3           Right Peroneal Motor (Ext Dig Brev)  35.8C  Ankle    3.5 <5.5 4.4 >3 B Fib Ankle 6.0 32.0 53 >40  B Fib    9.5  4.3  Poplt B Fib 1.5 9.0 60 >40  Poplt    11.0  4.3         Left Tibial Motor (Abd Hall Brev)  35.8C  Ankle    5.7 <6.0 12.4 >8 Knee Ankle 7.7 36.0 47 >40  Knee    13.4  7.1         Right Tibial Motor (Abd Hall Brev)  35.8C  Ankle    5.5 <6.0 12.8 >8 Knee Ankle 8.1 39.0 48 >40  Knee    13.6  7.7          H Reflex Studies   NR H-Lat (ms) Lat Norm (ms) L-R H-Lat (ms)  Right Tibial (Gastroc)  35.8C     31.16 <35    EMG   Side Muscle Ins Act Fibs Psw Fasc Number Recrt Dur Dur. Amp Amp. Poly Poly. Comment  Right AntTibialis Nml Nml Nml Nml Nml Nml Nml Nml Nml Nml Nml Nml N/A  Right Gastroc Nml Nml Nml Nml Nml Nml Nml Nml Nml Nml Nml Nml N/A  Right RectFemoris  Nml Nml Nml Nml Nml Nml Nml Nml Nml Nml Nml Nml N/A  Right GluteusMed Nml Nml Nml Nml Nml Nml Nml Nml Nml Nml Nml Nml N/A  Right BicepsFemS Nml Nml Nml Nml Nml Nml Nml Nml Nml Nml Nml Nml N/A  Left AntTibialis Nml Nml Nml Nml Nml Nml Nml Nml Nml Nml Nml Nml N/A  Left Gastroc Nml Nml Nml Nml Nml Nml Nml Nml Nml Nml Nml Nml N/A  Left RectFemoris Nml Nml Nml Nml Nml Nml Nml Nml Nml Nml Nml Nml N/A  Left GluteusMed Nml Nml Nml Nml Nml Nml Nml Nml Nml Nml Nml Nml N/A      Waveforms:

## 2016-09-29 ENCOUNTER — Ambulatory Visit (INDEPENDENT_AMBULATORY_CARE_PROVIDER_SITE_OTHER): Payer: BLUE CROSS/BLUE SHIELD | Admitting: General Practice

## 2016-09-29 DIAGNOSIS — E538 Deficiency of other specified B group vitamins: Secondary | ICD-10-CM | POA: Diagnosis not present

## 2016-09-29 MED ORDER — CYANOCOBALAMIN 1000 MCG/ML IJ SOLN
1000.0000 ug | Freq: Once | INTRAMUSCULAR | Status: AC
  Administered 2016-09-29: 1000 ug via INTRAMUSCULAR

## 2016-10-06 ENCOUNTER — Ambulatory Visit (INDEPENDENT_AMBULATORY_CARE_PROVIDER_SITE_OTHER): Payer: BLUE CROSS/BLUE SHIELD | Admitting: General Practice

## 2016-10-06 DIAGNOSIS — E538 Deficiency of other specified B group vitamins: Secondary | ICD-10-CM | POA: Diagnosis not present

## 2016-10-06 DIAGNOSIS — Z23 Encounter for immunization: Secondary | ICD-10-CM | POA: Diagnosis not present

## 2016-10-06 MED ORDER — CYANOCOBALAMIN 1000 MCG/ML IJ SOLN
1000.0000 ug | Freq: Once | INTRAMUSCULAR | Status: AC
  Administered 2016-10-06: 1000 ug via INTRAMUSCULAR

## 2016-10-09 DIAGNOSIS — G4733 Obstructive sleep apnea (adult) (pediatric): Secondary | ICD-10-CM | POA: Diagnosis not present

## 2016-10-11 ENCOUNTER — Other Ambulatory Visit: Payer: Self-pay | Admitting: Internal Medicine

## 2016-10-11 ENCOUNTER — Ambulatory Visit: Payer: BLUE CROSS/BLUE SHIELD

## 2016-10-11 DIAGNOSIS — Z7251 High risk heterosexual behavior: Secondary | ICD-10-CM

## 2016-10-13 ENCOUNTER — Encounter: Payer: Self-pay | Admitting: Internal Medicine

## 2016-10-13 ENCOUNTER — Ambulatory Visit (INDEPENDENT_AMBULATORY_CARE_PROVIDER_SITE_OTHER): Payer: BLUE CROSS/BLUE SHIELD | Admitting: Internal Medicine

## 2016-10-13 ENCOUNTER — Other Ambulatory Visit (INDEPENDENT_AMBULATORY_CARE_PROVIDER_SITE_OTHER): Payer: BLUE CROSS/BLUE SHIELD

## 2016-10-13 VITALS — BP 134/84 | HR 96 | Temp 98.1°F | Resp 16 | Ht 68.0 in | Wt 177.0 lb

## 2016-10-13 DIAGNOSIS — N342 Other urethritis: Secondary | ICD-10-CM | POA: Diagnosis not present

## 2016-10-13 LAB — URINALYSIS, ROUTINE W REFLEX MICROSCOPIC
Bilirubin Urine: NEGATIVE
Hgb urine dipstick: NEGATIVE
Ketones, ur: NEGATIVE
Leukocytes, UA: NEGATIVE
Nitrite: NEGATIVE
RBC / HPF: NONE SEEN (ref 0–?)
Specific Gravity, Urine: 1.025 (ref 1.000–1.030)
Total Protein, Urine: NEGATIVE
Urine Glucose: NEGATIVE
Urobilinogen, UA: 0.2 (ref 0.0–1.0)
WBC, UA: NONE SEEN (ref 0–?)
pH: 6 (ref 5.0–8.0)

## 2016-10-13 NOTE — Patient Instructions (Signed)
Urethritis, Adult Urethritis is an inflammation of the tube through which urine exits your bladder (urethra). What are the causes? Urethritis is often caused by an infection in your urethra. The infection can be viral, like herpes. The infection can also be bacterial, like gonorrhea. What increases the risk? Risk factors of urethritis include:  Having sex without using a condom.  Having multiple sexual partners.  Having poor hygiene.  What are the signs or symptoms? Symptoms of urethritis are less noticeable in women than in men. These symptoms include:  Burning feeling when you urinate (dysuria).  Discharge from your urethra.  Blood in your urine (hematuria).  Urinating more than usual.  How is this diagnosed? To confirm a diagnosis of urethritis, your health care provider will do the following:  Ask about your sexual history.  Perform a physical exam.  Have you provide a sample of your urine for lab testing.  Use a cotton swab to gently collect a sample from your urethra for lab testing.  How is this treated? It is important to treat urethritis. Depending on the cause, untreated urethritis may lead to serious genital infections and possibly infertility. Urethritis caused by a bacterial infection is treated with antibiotic medicine. All sexual partners must be treated. Follow these instructions at home:  Do not have sex until the test results are known and treatment is completed, even if your symptoms go away before you finish treatment.  If you were prescribed an antibiotic, finish it all even if you start to feel better. Contact a health care provider if:  Your symptoms are not improved in 3 days.  Your symptoms are getting worse.  You develop abdominal pain or pelvic pain (in women).  You develop joint pain.  You have a fever. Get help right away if:  You have severe pain in the belly, back, or side.  You have repeated vomiting. This information is not  intended to replace advice given to you by your health care provider. Make sure you discuss any questions you have with your health care provider. Document Released: 11/23/2000 Document Revised: 11/05/2015 Document Reviewed: 01/28/2013 Elsevier Interactive Patient Education  2017 [employer]vier Inc.  

## 2016-10-13 NOTE — Progress Notes (Signed)
Pre visit review using our clinic review tool, if applicable. No additional management support is needed unless otherwise documented below in the visit note. 

## 2016-10-13 NOTE — Progress Notes (Signed)
Subjective:  Patient ID: Caleb Obrien, male    DOB: 03-Jul-1965  Age: 51 y.o. MRN: [id number]  CC: Dysuria   HPI Caleb Obrien presents for concerns about dysuria. About 6 weeks ago he developed dysuria and was seen at an urgent care center. He was empirically treated with Rocephin and Zithromax. His testing at that time was negative for gonorrhea and chlamydia. He says most of his symptoms have resolved but he has mild stinging at the end of his urination.  Outpatient Medications Prior to Visit  Medication Sig Dispense Refill  . ALPRAZolam (XANAX) 0.25 MG tablet TAKE 1/2 TABLET BY MOUTH AS NEEDED 30 tablet 3  . aspirin EC 81 MG tablet Take 1 tablet (81 mg total) by mouth daily. 90 tablet 3  . atorvastatin (LIPITOR) 40 MG tablet TAKE ONE TABLET BY MOUTH DAILY 90 tablet 3  . buPROPion (WELLBUTRIN SR) 100 MG 12 hr tablet TAKE ONE TABLET BY MOUTH TWICE A DAY 60 tablet 11  . cetirizine (ZYRTEC) 10 MG tablet TAKE 1 TABLET(10 MG) BY MOUTH DAILY 30 tablet 11  . irbesartan (AVAPRO) 300 MG tablet Take 1 tablet (300 mg total) by mouth daily. 90 tablet 1  . levalbuterol (XOPENEX HFA) 45 MCG/ACT inhaler Inhale 1-2 puffs into the lungs every 4 (four) hours as needed for wheezing. 1 Inhaler 3  . ranitidine (ZANTAC) 75 MG tablet Take 2 tablets (150 mg total) by mouth at bedtime. 60 tablet 1  . tadalafil (CIALIS) 5 MG tablet Take 1 tablet (5 mg total) by mouth daily as needed for erectile dysfunction. 10 tablet 5  . TRUVADA 200-300 MG tablet TAKE 1 TABLET BY MOUTH EVERY DAY 30 tablet 3   No facility-administered medications prior to visit.     ROS Review of Systems  Constitutional: Negative for chills, fatigue and fever.  HENT: Negative.  Negative for facial swelling, sore throat and trouble swallowing.   Eyes: Negative.   Respiratory: Negative.   Cardiovascular: Negative.  Negative for chest pain, palpitations and leg swelling.  Gastrointestinal: Negative for abdominal pain, constipation,  diarrhea, nausea and vomiting.  Endocrine: Negative.   Genitourinary: Positive for dysuria. Negative for decreased urine volume, difficulty urinating, discharge, flank pain, frequency, genital sores, hematuria, penile pain, penile swelling, scrotal swelling, testicular pain and urgency.  Musculoskeletal: Negative.  Negative for back pain and neck pain.  Skin: Negative.  Negative for color change and rash.  Neurological: Negative.  Negative for dizziness.  Hematological: Negative for adenopathy. Does not bruise/bleed easily.  Psychiatric/Behavioral: Negative.     Objective:  BP 134/84 (BP Location: Left Arm, Patient Position: Sitting, Cuff Size: Normal)   Pulse 96   Temp 98.1 F (36.7 C) (Oral)   Resp 16   Ht 5\' 8"  (1.727 m)   Wt 177 lb (80.3 kg)   SpO2 99%   BMI 26.91 kg/m   BP Readings from Last 3 Encounters:  10/13/16 134/84  09/08/16 140/90  08/09/16 114/66    Wt Readings from Last 3 Encounters:  10/13/16 177 lb (80.3 kg)  09/08/16 178 lb 0.6 oz (80.8 kg)  08/09/16 175 lb (79.4 kg)    Physical Exam  Constitutional: He is oriented to person, place, and time. No distress.  HENT:  Mouth/Throat: Oropharynx is clear and moist. No oropharyngeal exudate.  Eyes: Conjunctivae are normal. Right eye exhibits no discharge. Left eye exhibits no discharge. No scleral icterus.  Neck: Normal range of motion. Neck supple. No JVD present. No tracheal deviation present.  No thyromegaly present.  Cardiovascular: Normal rate, regular rhythm, normal heart sounds and intact distal pulses.  Exam reveals no gallop and no friction rub.   No murmur heard. Pulmonary/Chest: Effort normal and breath sounds normal. No stridor. No respiratory distress. He has no wheezes. He has no rales. He exhibits no tenderness.  Abdominal: Soft. Bowel sounds are normal. He exhibits no distension and no mass. There is no tenderness. There is no rebound and no guarding. Hernia confirmed negative in the right inguinal  area and confirmed negative in the left inguinal area.  Genitourinary: Rectum normal, prostate normal, testes normal and penis normal. Rectal exam shows no external hemorrhoid, no internal hemorrhoid, no fissure, no mass, no tenderness, anal tone normal and guaiac negative stool. Prostate is not enlarged and not tender. Right testis shows no mass, no swelling and no tenderness. Right testis is descended. Left testis shows no mass, no swelling and no tenderness. Left testis is descended. Circumcised. No penile erythema or penile tenderness. No discharge found.  Musculoskeletal: Normal range of motion. He exhibits no edema or tenderness.  Lymphadenopathy:    He has no cervical adenopathy.       Right: No inguinal adenopathy present.       Left: No inguinal adenopathy present.  Neurological: He is oriented to person, place, and time.  Skin: Skin is warm and dry. No rash noted. He is not diaphoretic. No erythema. No pallor.  Vitals reviewed.   Lab Results  Component Value Date   WBC 8.8 04/12/2016   HGB 16.2 04/12/2016   HCT 46.9 04/12/2016   PLT 359.0 04/12/2016   GLUCOSE 82 09/08/2016   CHOL 176 04/12/2016   TRIG 97.0 04/12/2016   HDL 56.30 04/12/2016   LDLDIRECT 144.4 01/27/2012   LDLCALC 101 (H) 04/12/2016   ALT 42 04/12/2016   AST 28 04/12/2016   NA 141 09/08/2016   K 4.1 09/08/2016   CL 105 09/08/2016   CREATININE 1.19 09/08/2016   BUN 11 09/08/2016   CO2 30 09/08/2016   TSH 3.03 04/12/2016   PSA 0.91 04/12/2016    Dg Ribs Unilateral W/chest Left  Result Date: 04/06/2016 CLINICAL DATA:  Fall 4 days ago with left-sided chest pain EXAM: LEFT RIBS AND CHEST - 3+ VIEW COMPARISON:  01/24/2014 FINDINGS: Cardiac shadow is within normal limits. The lungs are clear bilaterally. No pneumothorax or sizable effusion is noted. Minimal irregularity of the anterior aspect of the left sixth rib is noted which may represent an undisplaced fracture. No other fractures are noted. IMPRESSION:  Irregularity of the anterior aspect of the left sixth rib which may represent an undisplaced fracture. Electronically Signed   By: Caleb Catalina M.D.   On: 04/06/2016 15:14    Assessment & Plan:   Caleb Obrien was seen today for dysuria.  Diagnoses and all orders for this visit:  Urethritis- his exam is normal, urinalysis is normal and screening for syphilis is negative, he has no evidence of bacterial prostatitis or prostatism. 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 -     Urinalysis, Routine w reflex microscopic; Future -     RPR; Future   I am having Mr. Buchheit maintain his levalbuterol, ranitidine, cetirizine, tadalafil, aspirin EC, ALPRAZolam, atorvastatin, buPROPion, irbesartan, and TRUVADA.  No orders of the defined types were placed in this encounter.    Follow-up: No Follow-up on file.  Scarlette Calico, MD

## 2016-10-14 ENCOUNTER — Encounter: Payer: Self-pay | Admitting: Internal Medicine

## 2016-10-14 LAB — SYPHILIS: RPR W/REFLEX TO RPR TITER AND TREPONEMAL ANTIBODIES, TRADITIONAL SCREENING AND DIAGNOSIS ALGORITHM

## 2016-10-24 DIAGNOSIS — Z713 Dietary counseling and surveillance: Secondary | ICD-10-CM | POA: Diagnosis not present

## 2016-11-03 ENCOUNTER — Ambulatory Visit (INDEPENDENT_AMBULATORY_CARE_PROVIDER_SITE_OTHER): Payer: BLUE CROSS/BLUE SHIELD | Admitting: General Practice

## 2016-11-03 DIAGNOSIS — E538 Deficiency of other specified B group vitamins: Secondary | ICD-10-CM | POA: Diagnosis not present

## 2016-11-03 MED ORDER — CYANOCOBALAMIN 1000 MCG/ML IJ SOLN
1000.0000 ug | Freq: Once | INTRAMUSCULAR | Status: AC
  Administered 2016-11-03: 1000 ug via INTRAMUSCULAR

## 2016-11-08 DIAGNOSIS — G4733 Obstructive sleep apnea (adult) (pediatric): Secondary | ICD-10-CM | POA: Diagnosis not present

## 2016-11-28 DIAGNOSIS — H5213 Myopia, bilateral: Secondary | ICD-10-CM | POA: Diagnosis not present

## 2016-11-28 DIAGNOSIS — H524 Presbyopia: Secondary | ICD-10-CM | POA: Diagnosis not present

## 2016-12-01 ENCOUNTER — Ambulatory Visit (INDEPENDENT_AMBULATORY_CARE_PROVIDER_SITE_OTHER): Payer: BLUE CROSS/BLUE SHIELD | Admitting: General Practice

## 2016-12-01 DIAGNOSIS — E538 Deficiency of other specified B group vitamins: Secondary | ICD-10-CM | POA: Diagnosis not present

## 2016-12-01 MED ORDER — CYANOCOBALAMIN 1000 MCG/ML IJ SOLN
1000.0000 ug | Freq: Once | INTRAMUSCULAR | Status: AC
  Administered 2016-12-01: 1000 ug via INTRAMUSCULAR

## 2016-12-06 ENCOUNTER — Other Ambulatory Visit: Payer: Self-pay | Admitting: Internal Medicine

## 2016-12-06 DIAGNOSIS — I10 Essential (primary) hypertension: Secondary | ICD-10-CM

## 2016-12-09 DIAGNOSIS — G4733 Obstructive sleep apnea (adult) (pediatric): Secondary | ICD-10-CM | POA: Diagnosis not present

## 2017-01-05 ENCOUNTER — Ambulatory Visit (INDEPENDENT_AMBULATORY_CARE_PROVIDER_SITE_OTHER): Payer: BLUE CROSS/BLUE SHIELD | Admitting: General Practice

## 2017-01-05 DIAGNOSIS — Z23 Encounter for immunization: Secondary | ICD-10-CM

## 2017-01-05 DIAGNOSIS — E538 Deficiency of other specified B group vitamins: Secondary | ICD-10-CM

## 2017-01-05 MED ORDER — CYANOCOBALAMIN 1000 MCG/ML IJ SOLN
1000.0000 ug | Freq: Once | INTRAMUSCULAR | Status: AC
  Administered 2017-01-05: 1000 ug via INTRAMUSCULAR

## 2017-01-11 ENCOUNTER — Other Ambulatory Visit (INDEPENDENT_AMBULATORY_CARE_PROVIDER_SITE_OTHER): Payer: BLUE CROSS/BLUE SHIELD

## 2017-01-11 ENCOUNTER — Ambulatory Visit (INDEPENDENT_AMBULATORY_CARE_PROVIDER_SITE_OTHER): Payer: BLUE CROSS/BLUE SHIELD | Admitting: Internal Medicine

## 2017-01-11 ENCOUNTER — Encounter: Payer: Self-pay | Admitting: Internal Medicine

## 2017-01-11 VITALS — BP 120/72 | HR 81 | Temp 98.4°F | Resp 16 | Ht 68.0 in | Wt 158.0 lb

## 2017-01-11 DIAGNOSIS — Z7252 High risk homosexual behavior: Secondary | ICD-10-CM

## 2017-01-11 DIAGNOSIS — I10 Essential (primary) hypertension: Secondary | ICD-10-CM | POA: Diagnosis not present

## 2017-01-11 LAB — BASIC METABOLIC PANEL
BUN: 17 mg/dL (ref 6–23)
CO2: 31 mEq/L (ref 19–32)
Calcium: 9.4 mg/dL (ref 8.4–10.5)
Chloride: 100 mEq/L (ref 96–112)
Creatinine, Ser: 1.03 mg/dL (ref 0.40–1.50)
GFR: 81.46 mL/min (ref >60.00)
Glucose, Bld: 90 mg/dL (ref 70–99)
Potassium: 4.1 mEq/L (ref 3.5–5.1)
Sodium: 138 mEq/L (ref 135–145)

## 2017-01-11 LAB — URINALYSIS, ROUTINE W REFLEX MICROSCOPIC
Bilirubin Urine: NEGATIVE
Hgb urine dipstick: NEGATIVE
Ketones, ur: NEGATIVE
Leukocytes, UA: NEGATIVE
Nitrite: NEGATIVE
RBC / HPF: NONE SEEN (ref 0–?)
Specific Gravity, Urine: 1.015 (ref 1.000–1.030)
Total Protein, Urine: NEGATIVE
Urine Glucose: NEGATIVE
Urobilinogen, UA: 0.2 (ref 0.0–1.0)
pH: 8 (ref 5.0–8.0)

## 2017-01-11 NOTE — Patient Instructions (Signed)

## 2017-01-11 NOTE — Progress Notes (Signed)
Subjective:  Patient ID: Caleb Obrien, male    DOB: 11-Dec-1965  Age: 51 y.o. MRN: [id number]  CC: Hypertension   HPI Caleb Obrien presents for a BP check and f/up on Truvada tx for PrEP. He considers his sexual activity to be moderately high risk. He feels well and offers no complaints.  Outpatient Medications Prior to Visit  Medication Sig Dispense Refill  . ALPRAZolam (XANAX) 0.25 MG tablet TAKE 1/2 TABLET BY MOUTH AS NEEDED 30 tablet 3  . aspirin EC 81 MG tablet Take 1 tablet (81 mg total) by mouth daily. 90 tablet 3  . atorvastatin (LIPITOR) 40 MG tablet TAKE ONE TABLET BY MOUTH DAILY 90 tablet 3  . buPROPion (WELLBUTRIN SR) 100 MG 12 hr tablet TAKE ONE TABLET BY MOUTH TWICE A DAY 60 tablet 11  . cetirizine (ZYRTEC) 10 MG tablet TAKE 1 TABLET(10 MG) BY MOUTH DAILY 30 tablet 11  . irbesartan (AVAPRO) 300 MG tablet TAKE ONE TABLET BY MOUTH DAILY 90 tablet 1  . levalbuterol (XOPENEX HFA) 45 MCG/ACT inhaler Inhale 1-2 puffs into the lungs every 4 (four) hours as needed for wheezing. 1 Inhaler 3  . ranitidine (ZANTAC) 75 MG tablet Take 2 tablets (150 mg total) by mouth at bedtime. 60 tablet 1  . tadalafil (CIALIS) 5 MG tablet Take 1 tablet (5 mg total) by mouth daily as needed for erectile dysfunction. 10 tablet 5  . TRUVADA 200-300 MG tablet TAKE 1 TABLET BY MOUTH EVERY DAY 30 tablet 3   No facility-administered medications prior to visit.     ROS Review of Systems  Constitutional: Negative.  Negative for appetite change, diaphoresis, fatigue, fever and unexpected weight change.  HENT: Negative.  Negative for sore throat, trouble swallowing and voice change.   Eyes: Negative for visual disturbance.  Respiratory: Negative.  Negative for cough, chest tightness, shortness of breath and wheezing.   Cardiovascular: Negative.  Negative for chest pain, palpitations and leg swelling.  Gastrointestinal: Negative for abdominal pain, constipation, diarrhea, nausea and vomiting.    Endocrine: Negative.   Genitourinary: Negative for difficulty urinating, discharge, dysuria, genital sores, scrotal swelling, testicular pain and urgency.  Musculoskeletal: Negative.   Skin: Negative.  Negative for color change and rash.  Allergic/Immunologic: Negative.   Neurological: Negative.   Hematological: Negative for adenopathy. Does not bruise/bleed easily.  Psychiatric/Behavioral: Negative.     Objective:  BP 120/72 (BP Location: Left Arm, Patient Position: Sitting, Cuff Size: Normal)   Pulse 81   Temp 98.4 F (36.9 C) (Oral)   Ht 5\' 8"  (1.727 m)   Wt 158 lb (71.7 kg)   SpO2 99%   BMI 24.02 kg/m   BP Readings from Last 3 Encounters:  01/11/17 120/72  10/13/16 134/84  09/08/16 140/90    Wt Readings from Last 3 Encounters:  01/11/17 158 lb (71.7 kg)  10/13/16 177 lb (80.3 kg)  09/08/16 178 lb 0.6 oz (80.8 kg)    Physical Exam  Constitutional: He is oriented to person, place, and time. No distress.  HENT:  Mouth/Throat: Oropharynx is clear and moist. No oropharyngeal exudate.  Eyes: Conjunctivae are normal. Right eye exhibits no discharge. Left eye exhibits no discharge. No scleral icterus.  Neck: Normal range of motion. Neck supple. No JVD present. No thyromegaly present.  Cardiovascular: Normal rate, regular rhythm and intact distal pulses.  Exam reveals no gallop and no friction rub.   No murmur heard. Pulmonary/Chest: Effort normal and breath sounds normal. No respiratory distress. He  has no wheezes. He has no rales. He exhibits no tenderness.  Abdominal: Soft. Bowel sounds are normal. He exhibits no distension and no mass. There is no tenderness. There is no rebound and no guarding.  Musculoskeletal: Normal range of motion. He exhibits no edema, tenderness or deformity.  Lymphadenopathy:    He has no cervical adenopathy.  Neurological: He is alert and oriented to person, place, and time.  Skin: Skin is warm and dry. No rash noted. He is not diaphoretic. No  erythema. No pallor.  Vitals reviewed.   Lab Results  Component Value Date   WBC 8.8 04/12/2016   HGB 16.2 04/12/2016   HCT 46.9 04/12/2016   PLT 359.0 04/12/2016   GLUCOSE 82 09/08/2016   CHOL 176 04/12/2016   TRIG 97.0 04/12/2016   HDL 56.30 04/12/2016   LDLDIRECT 144.4 01/27/2012   LDLCALC 101 (H) 04/12/2016   ALT 42 04/12/2016   AST 28 04/12/2016   NA 141 09/08/2016   K 4.1 09/08/2016   CL 105 09/08/2016   CREATININE 1.19 09/08/2016   BUN 11 09/08/2016   CO2 30 09/08/2016   TSH 3.03 04/12/2016   PSA 0.91 04/12/2016    Dg Ribs Unilateral W/chest Left  Result Date: 04/06/2016 CLINICAL DATA:  Fall 4 days ago with left-sided chest pain EXAM: LEFT RIBS AND CHEST - 3+ VIEW COMPARISON:  01/24/2014 FINDINGS: Cardiac shadow is within normal limits. The lungs are clear bilaterally. No pneumothorax or sizable effusion is noted. Minimal irregularity of the anterior aspect of the left sixth rib is noted which may represent an undisplaced fracture. No other fractures are noted. IMPRESSION: Irregularity of the anterior aspect of the left sixth rib which may represent an undisplaced fracture. Electronically Signed   By: Caleb Obrien M.D.   On: 04/06/2016 15:14    Assessment & Plan:   Caleb Obrien was seen today for hypertension.  Diagnoses and all orders for this visit:  High risk homosexual behavior- screening for HIV and Hep B are negative. His creatinine cl is 84 mL/min he can continue Truvada. -     HIV antibody; Future -     RPR; Future -     Hepatitis B surface antigen; Future  Essential hypertension, benign- his blood pressure is well controlled. Electrolytes and renal function are normal. -     Basic metabolic panel; Future -     Urinalysis, Routine w reflex microscopic; Future   I am having Caleb Obrien maintain his levalbuterol, ranitidine, cetirizine, tadalafil, aspirin EC, ALPRAZolam, atorvastatin, buPROPion, TRUVADA, and irbesartan.  No orders of the defined types were  placed in this encounter.    Follow-up: No Follow-up on file.  Caleb Calico, MD

## 2017-01-12 ENCOUNTER — Encounter: Payer: Self-pay | Admitting: Internal Medicine

## 2017-01-12 LAB — HEPATITIS B SURFACE ANTIGEN: Hepatitis B Surface Ag: NONREACTIVE

## 2017-01-12 LAB — HIV ANTIBODY (ROUTINE TESTING W REFLEX): HIV 1&2 Ab, 4th Generation: NONREACTIVE

## 2017-01-12 LAB — RPR

## 2017-02-01 ENCOUNTER — Ambulatory Visit (INDEPENDENT_AMBULATORY_CARE_PROVIDER_SITE_OTHER): Payer: BLUE CROSS/BLUE SHIELD

## 2017-02-01 DIAGNOSIS — E538 Deficiency of other specified B group vitamins: Secondary | ICD-10-CM

## 2017-02-01 MED ORDER — CYANOCOBALAMIN 1000 MCG/ML IJ SOLN
1000.0000 ug | Freq: Once | INTRAMUSCULAR | Status: AC
  Administered 2017-02-01: 1000 ug via INTRAMUSCULAR

## 2017-02-08 DIAGNOSIS — G4733 Obstructive sleep apnea (adult) (pediatric): Secondary | ICD-10-CM | POA: Diagnosis not present

## 2017-02-16 ENCOUNTER — Encounter: Payer: Self-pay | Admitting: Internal Medicine

## 2017-02-16 ENCOUNTER — Other Ambulatory Visit: Payer: Self-pay | Admitting: Internal Medicine

## 2017-02-16 DIAGNOSIS — Z7251 High risk heterosexual behavior: Secondary | ICD-10-CM

## 2017-02-16 DIAGNOSIS — Z7252 High risk homosexual behavior: Secondary | ICD-10-CM

## 2017-02-16 MED ORDER — EMTRICITABINE-TENOFOVIR DF 200-300 MG PO TABS: 1.0000 | ORAL_TABLET | Freq: Every day | ORAL | 1 refills | Status: DC

## 2017-03-11 DIAGNOSIS — G4733 Obstructive sleep apnea (adult) (pediatric): Secondary | ICD-10-CM | POA: Diagnosis not present

## 2017-04-10 DIAGNOSIS — G4733 Obstructive sleep apnea (adult) (pediatric): Secondary | ICD-10-CM | POA: Diagnosis not present

## 2017-04-12 DIAGNOSIS — Z23 Encounter for immunization: Secondary | ICD-10-CM | POA: Diagnosis not present

## 2017-05-11 DIAGNOSIS — G4733 Obstructive sleep apnea (adult) (pediatric): Secondary | ICD-10-CM | POA: Diagnosis not present

## 2017-05-15 ENCOUNTER — Other Ambulatory Visit: Payer: Self-pay | Admitting: Internal Medicine

## 2017-05-15 DIAGNOSIS — E785 Hyperlipidemia, unspecified: Secondary | ICD-10-CM

## 2017-05-18 ENCOUNTER — Encounter: Payer: Self-pay | Admitting: Internal Medicine

## 2017-05-18 ENCOUNTER — Other Ambulatory Visit (INDEPENDENT_AMBULATORY_CARE_PROVIDER_SITE_OTHER): Payer: BLUE CROSS/BLUE SHIELD

## 2017-05-18 ENCOUNTER — Ambulatory Visit (INDEPENDENT_AMBULATORY_CARE_PROVIDER_SITE_OTHER): Payer: BLUE CROSS/BLUE SHIELD | Admitting: Internal Medicine

## 2017-05-18 VITALS — BP 138/90 | HR 85 | Temp 99.1°F | Ht 68.0 in | Wt 156.8 lb

## 2017-05-18 DIAGNOSIS — Z7251 High risk heterosexual behavior: Secondary | ICD-10-CM

## 2017-05-18 DIAGNOSIS — Z0000 Encounter for general adult medical examination without abnormal findings: Secondary | ICD-10-CM | POA: Diagnosis not present

## 2017-05-18 DIAGNOSIS — I10 Essential (primary) hypertension: Secondary | ICD-10-CM | POA: Diagnosis not present

## 2017-05-18 DIAGNOSIS — Z7252 High risk homosexual behavior: Secondary | ICD-10-CM | POA: Diagnosis not present

## 2017-05-18 DIAGNOSIS — E785 Hyperlipidemia, unspecified: Secondary | ICD-10-CM

## 2017-05-18 DIAGNOSIS — E538 Deficiency of other specified B group vitamins: Secondary | ICD-10-CM

## 2017-05-18 DIAGNOSIS — G63 Polyneuropathy in diseases classified elsewhere: Secondary | ICD-10-CM | POA: Diagnosis not present

## 2017-05-18 LAB — VITAMIN B12: Vitamin B-12: 447 pg/mL (ref 211–911)

## 2017-05-18 LAB — URINALYSIS, ROUTINE W REFLEX MICROSCOPIC
Bilirubin Urine: NEGATIVE
Hgb urine dipstick: NEGATIVE
Ketones, ur: NEGATIVE
Leukocytes, UA: NEGATIVE
Nitrite: NEGATIVE
RBC / HPF: NONE SEEN (ref 0–?)
Specific Gravity, Urine: 1.02 (ref 1.000–1.030)
Total Protein, Urine: NEGATIVE
Urine Glucose: NEGATIVE
Urobilinogen, UA: 0.2 (ref 0.0–1.0)
pH: 6.5 (ref 5.0–8.0)

## 2017-05-18 LAB — CBC WITH DIFFERENTIAL/PLATELET
Basophils Absolute: 0 10*3/uL (ref 0.0–0.1)
Basophils Relative: 0.7 % (ref 0.0–3.0)
Eosinophils Absolute: 0.1 10*3/uL (ref 0.0–0.7)
Eosinophils Relative: 1.5 % (ref 0.0–5.0)
HCT: 46.1 % (ref 39.0–52.0)
Hemoglobin: 15.6 g/dL (ref 13.0–17.0)
Lymphocytes Relative: 27.4 % (ref 12.0–46.0)
Lymphs Abs: 1.6 10*3/uL (ref 0.7–4.0)
MCHC: 33.8 g/dL (ref 30.0–36.0)
MCV: 106.2 fl — ABNORMAL HIGH (ref 78.0–100.0)
Monocytes Absolute: 1 10*3/uL (ref 0.1–1.0)
Monocytes Relative: 17.8 % — ABNORMAL HIGH (ref 3.0–12.0)
Neutro Abs: 3 10*3/uL (ref 1.4–7.7)
Neutrophils Relative %: 52.6 % (ref 43.0–77.0)
Platelets: 307 10*3/uL (ref 150.0–400.0)
RBC: 4.34 Mil/uL (ref 4.22–5.81)
RDW: 12.6 % (ref 11.5–15.5)
WBC: 5.7 10*3/uL (ref 4.0–10.5)

## 2017-05-18 LAB — COMPREHENSIVE METABOLIC PANEL
ALT: 30 U/L (ref 0–53)
AST: 22 U/L (ref 0–37)
Albumin: 5 g/dL (ref 3.5–5.2)
Alkaline Phosphatase: 85 U/L (ref 39–117)
BUN: 22 mg/dL (ref 6–23)
CO2: 27 mEq/L (ref 19–32)
Calcium: 9.5 mg/dL (ref 8.4–10.5)
Chloride: 103 mEq/L (ref 96–112)
Creatinine, Ser: 1.04 mg/dL (ref 0.40–1.50)
GFR: 80.44 mL/min (ref >60.00)
Glucose, Bld: 99 mg/dL (ref 70–99)
Potassium: 4.5 mEq/L (ref 3.5–5.1)
Sodium: 141 mEq/L (ref 135–145)
Total Bilirubin: 0.8 mg/dL (ref 0.2–1.2)
Total Protein: 7.1 g/dL (ref 6.0–8.3)

## 2017-05-18 LAB — LIPID PANEL
Cholesterol: 135 mg/dL (ref 0–200)
HDL: 53 mg/dL (ref >39.00)
LDL Cholesterol: 70 mg/dL (ref 0–99)
NonHDL: 81.65
Total CHOL/HDL Ratio: 3
Triglycerides: 56 mg/dL (ref 0.0–149.0)
VLDL: 11.2 mg/dL (ref 0.0–40.0)

## 2017-05-18 LAB — PSA: PSA: 0.82 ng/mL (ref 0.10–4.00)

## 2017-05-18 LAB — TSH: TSH: 2.95 u[IU]/mL (ref 0.35–4.50)

## 2017-05-18 NOTE — Progress Notes (Signed)
Subjective:  Patient ID: Caleb Obrien, male    DOB: Oct 13, 1965  Age: 50 y.o. MRN: [id number]  CC: Annual Exam and Hypertension   HPI Caleb Obrien presents for a CPX.  He tells me that his blood pressure has been well controlled.  He's had no recent episodes of HA, CP, DOE, palpitations, edema, or fatigue.  He had a sexual partner a few months ago who was HIV positive and was not monogamous.  He was unaware that his partner was HIV positive and that he was not monogamous.  Fortunately, he tells me that his partner's viral load was undetectable.  He remains on Truvada with no side effects.  He is not on a vitamin B12 supplement and wants to have his level rechecked today.  The sensations in the front of his thighs are improved.  Outpatient Medications Prior to Visit  Medication Sig Dispense Refill  . ALPRAZolam (XANAX) 0.25 MG tablet TAKE 1/2 TABLET BY MOUTH AS NEEDED 30 tablet 3  . aspirin EC 81 MG tablet Take 1 tablet (81 mg total) by mouth daily. 90 tablet 3  . atorvastatin (LIPITOR) 40 MG tablet TAKE ONE TABLET BY MOUTH DAILY 90 tablet 1  . buPROPion (WELLBUTRIN SR) 100 MG 12 hr tablet TAKE ONE TABLET BY MOUTH TWICE A DAY 60 tablet 11  . cetirizine (ZYRTEC) 10 MG tablet TAKE 1 TABLET(10 MG) BY MOUTH DAILY 30 tablet 11  . irbesartan (AVAPRO) 300 MG tablet TAKE ONE TABLET BY MOUTH DAILY 90 tablet 1  . ranitidine (ZANTAC) 75 MG tablet Take 2 tablets (150 mg total) by mouth at bedtime. 60 tablet 1  . tadalafil (CIALIS) 5 MG tablet Take 1 tablet (5 mg total) by mouth daily as needed for erectile dysfunction. 10 tablet 5  . emtricitabine-tenofovir (TRUVADA) 200-300 MG tablet Take 1 tablet by mouth daily. 90 tablet 1  . levalbuterol (XOPENEX HFA) 45 MCG/ACT inhaler Inhale 1-2 puffs into the lungs every 4 (four) hours as needed for wheezing. 1 Inhaler 3   No facility-administered medications prior to visit.     ROS Review of Systems  Constitutional: Negative.  Negative for  chills, diaphoresis, fatigue and fever.  HENT: Negative.  Negative for sore throat and trouble swallowing.   Eyes: Negative.   Respiratory: Negative.  Negative for cough, shortness of breath and wheezing.   Cardiovascular: Negative for chest pain, palpitations and leg swelling.  Gastrointestinal: Negative.  Negative for abdominal pain, constipation, diarrhea, nausea and vomiting.  Endocrine: Negative.   Genitourinary: Negative.  Negative for discharge, dysuria, genital sores, hematuria, penile pain, testicular pain and urgency.  Musculoskeletal: Negative.   Skin: Negative for color change and rash.  Allergic/Immunologic: Negative.   Neurological: Negative.   Hematological: Negative for adenopathy. Does not bruise/bleed easily.  Psychiatric/Behavioral: Negative.  Negative for dysphoric mood and sleep disturbance. The patient is not nervous/anxious.     Objective:  BP 138/90 (BP Location: Left Arm, Patient Position: Sitting, Cuff Size: Normal)   Pulse 85   Temp 99.1 F (37.3 C) (Oral)   Ht 5\' 8"  (1.727 m)   Wt 156 lb 12 oz (71.1 kg)   SpO2 100%   BMI 23.83 kg/m   BP Readings from Last 3 Encounters:  05/18/17 138/90  01/11/17 120/72  10/13/16 134/84    Wt Readings from Last 3 Encounters:  05/18/17 156 lb 12 oz (71.1 kg)  01/11/17 158 lb (71.7 kg)  10/13/16 177 lb (80.3 kg)    Physical Exam  Constitutional: He is oriented to person, place, and time. No distress.  HENT:  Mouth/Throat: Oropharynx is clear and moist. No oropharyngeal exudate.  Eyes: Conjunctivae are normal. Right eye exhibits no discharge. Left eye exhibits no discharge. No scleral icterus.  Neck: Normal range of motion. Neck supple. No JVD present. No thyromegaly present.  Cardiovascular: Normal rate, regular rhythm and normal heart sounds.  No murmur heard. Pulmonary/Chest: Effort normal and breath sounds normal. He has no wheezes. He has no rales.  Abdominal: Soft. Bowel sounds are normal. He exhibits no  mass. There is no tenderness. There is no rebound and no guarding. Hernia confirmed negative in the right inguinal area and confirmed negative in the left inguinal area.  Genitourinary: Rectum normal, prostate normal, testes normal and penis normal. Rectal exam shows no external hemorrhoid, no internal hemorrhoid, no fissure, no mass, no tenderness, anal tone normal and guaiac negative stool. Prostate is not enlarged and not tender. Right testis shows no mass, no swelling and no tenderness. Right testis is descended. Left testis shows no mass, no swelling and no tenderness. Left testis is descended. Circumcised. No penile erythema or penile tenderness. No discharge found.  Musculoskeletal: Normal range of motion. He exhibits no edema or tenderness.  Lymphadenopathy:    He has no cervical adenopathy.       Right: No inguinal adenopathy present.       Left: No inguinal adenopathy present.  Neurological: He is alert and oriented to person, place, and time.  Skin: Skin is warm and dry. No rash noted. He is not diaphoretic. No erythema. No pallor.  Psychiatric: He has a normal mood and affect. Thought content normal.  Vitals reviewed.   Lab Results  Component Value Date   WBC 5.7 05/18/2017   HGB 15.6 05/18/2017   HCT 46.1 05/18/2017   PLT 307.0 05/18/2017   GLUCOSE 99 05/18/2017   CHOL 135 05/18/2017   TRIG 56.0 05/18/2017   HDL 53.00 05/18/2017   LDLDIRECT 144.4 01/27/2012   LDLCALC 70 05/18/2017   ALT 30 05/18/2017   AST 22 05/18/2017   NA 141 05/18/2017   K 4.5 05/18/2017   CL 103 05/18/2017   CREATININE 1.04 05/18/2017   BUN 22 05/18/2017   CO2 27 05/18/2017   TSH 2.95 05/18/2017   PSA 0.82 05/18/2017    Dg Ribs Unilateral W/chest Left  Result Date: 04/06/2016 CLINICAL DATA:  Fall 4 days ago with left-sided chest pain EXAM: LEFT RIBS AND CHEST - 3+ VIEW COMPARISON:  01/24/2014 FINDINGS: Cardiac shadow is within normal limits. The lungs are clear bilaterally. No pneumothorax or  sizable effusion is noted. Minimal irregularity of the anterior aspect of the left sixth rib is noted which may represent an undisplaced fracture. No other fractures are noted. IMPRESSION: Irregularity of the anterior aspect of the left sixth rib which may represent an undisplaced fracture. Electronically Signed   By: Inez Catalina M.D.   On: 04/06/2016 15:14    Assessment & Plan:   Khing was seen today for annual exam and hypertension.  Diagnoses and all orders for this visit:  Essential hypertension, benign- His blood pressure is well controlled.  Electrolytes and renal function are normal.  Will continue the ARB at the current dose. -     Comprehensive metabolic panel; Future -     CBC with Differential/Platelet; Future -     Urinalysis, Routine w reflex microscopic; Future  Routine general medical examination at a health care facility- Exam completed, labs  reviewed, vaccines reviewed and updated, patient education material was given. -     Lipid panel; Future -     PSA; Future  Hyperlipidemia with target LDL less than 130- He has achieved his LDL goal and is doing well on the statin. -     TSH; Future  High risk homosexual behavior- Safe sexual practices were discussed again.  He is negative for HIV and hep B and has normal renal function so he can continue Truvada.  He has protective antibodies against hepatitis A and B. Screening for syphilis is negative. -     HIV antibody; Future -     RPR; Future -     Hepatitis B surface antigen; Future -     Hepatitis B surface antibody; Future -     Hepatitis A antibody, total; Future -     emtricitabine-tenofovir (TRUVADA) 200-300 MG tablet; Take 1 tablet by mouth daily.  Vitamin B12 deficiency neuropathy ([place])- His B12 level is normal now.  His symptoms are improving.  Will continue to monitor him off of B12 supplementation. -     Vitamin B12; Future  High risk sexual behavior   I have discontinued Jeneen Rinks C. Bowen's levalbuterol. I  am also having him maintain his ranitidine, cetirizine, tadalafil, aspirin EC, ALPRAZolam, buPROPion, irbesartan, atorvastatin, and emtricitabine-tenofovir.  Meds ordered this encounter  Medications  . emtricitabine-tenofovir (TRUVADA) 200-300 MG tablet    Sig: Take 1 tablet by mouth daily.    Dispense:  90 tablet    Refill:  1     Follow-up: Return in about 3 months (around 08/16/2017).  Scarlette Calico, MD

## 2017-05-18 NOTE — Patient Instructions (Signed)

## 2017-05-19 ENCOUNTER — Encounter: Payer: Self-pay | Admitting: Internal Medicine

## 2017-05-19 LAB — HIV ANTIBODY (ROUTINE TESTING W REFLEX): HIV 1&2 Ab, 4th Generation: NONREACTIVE

## 2017-05-19 LAB — HEPATITIS A ANTIBODY, TOTAL: Hepatitis A AB,Total: REACTIVE — AB

## 2017-05-19 LAB — RPR: RPR Ser Ql: NONREACTIVE

## 2017-05-19 LAB — HEPATITIS B SURFACE ANTIBODY, QUANTITATIVE: Hepatitis B-Post: >1000 m[IU]/mL (ref >=10)

## 2017-05-19 LAB — HEPATITIS B SURFACE ANTIGEN: Hepatitis B Surface Ag: NONREACTIVE

## 2017-05-19 MED ORDER — EMTRICITABINE-TENOFOVIR DF 200-300 MG PO TABS: 1.0000 | ORAL_TABLET | Freq: Every day | ORAL | 1 refills | Status: DC

## 2017-05-31 ENCOUNTER — Other Ambulatory Visit: Payer: Self-pay | Admitting: Internal Medicine

## 2017-05-31 DIAGNOSIS — I10 Essential (primary) hypertension: Secondary | ICD-10-CM

## 2017-06-09 ENCOUNTER — Other Ambulatory Visit: Payer: Self-pay | Admitting: Internal Medicine

## 2017-06-09 DIAGNOSIS — F411 Generalized anxiety disorder: Secondary | ICD-10-CM

## 2017-06-10 DIAGNOSIS — G4733 Obstructive sleep apnea (adult) (pediatric): Secondary | ICD-10-CM | POA: Diagnosis not present

## 2017-06-13 HISTORY — PX: POLYPECTOMY: SHX149

## 2017-06-13 HISTORY — PX: COLONOSCOPY: SHX174

## 2017-07-11 DIAGNOSIS — G4733 Obstructive sleep apnea (adult) (pediatric): Secondary | ICD-10-CM | POA: Diagnosis not present

## 2017-08-08 ENCOUNTER — Encounter: Payer: Self-pay | Admitting: Neurology

## 2017-08-10 ENCOUNTER — Encounter: Payer: Self-pay | Admitting: Neurology

## 2017-08-10 ENCOUNTER — Ambulatory Visit (INDEPENDENT_AMBULATORY_CARE_PROVIDER_SITE_OTHER): Payer: BLUE CROSS/BLUE SHIELD | Admitting: Neurology

## 2017-08-10 VITALS — BP 138/89 | HR 86 | Ht 68.0 in | Wt 159.0 lb

## 2017-08-10 DIAGNOSIS — Z9989 Dependence on other enabling machines and devices: Secondary | ICD-10-CM | POA: Diagnosis not present

## 2017-08-10 DIAGNOSIS — G4733 Obstructive sleep apnea (adult) (pediatric): Secondary | ICD-10-CM | POA: Diagnosis not present

## 2017-08-10 NOTE — Patient Instructions (Signed)
Steps to Quit Smoking Smoking tobacco can be harmful to your health and can affect almost every organ in your body. Smoking puts you, and those around you, at risk for developing many serious chronic diseases. Quitting smoking is difficult, but it is one of the best things that you can do for your health. It is never too late to quit. What are the benefits of quitting smoking? When you quit smoking, you lower your risk of developing serious diseases and conditions, such as:  Lung cancer or lung disease, such as COPD.  Heart disease.  Stroke.  Heart attack.  Infertility.  Osteoporosis and bone fractures.  Additionally, symptoms such as coughing, wheezing, and shortness of breath may get better when you quit. You may also find that you get sick less often because your body is stronger at fighting off colds and infections. If you are pregnant, quitting smoking can help to reduce your chances of having a baby of low birth weight. How do I get ready to quit? When you decide to quit smoking, create a plan to make sure that you are successful. Before you quit:  Pick a date to quit. Set a date within the next two weeks to give you time to prepare.  Write down the reasons why you are quitting. Keep this list in places where you will see it often, such as on your bathroom mirror or in your car or wallet.  Identify the people, places, things, and activities that make you want to smoke (triggers) and avoid them. Make sure to take these actions: ? Throw away all cigarettes at home, at work, and in your car. ? Throw away smoking accessories, such as ashtrays and lighters. ? Clean your car and make sure to empty the ashtray. ? Clean your home, including curtains and carpets.  Tell your family, friends, and coworkers that you are quitting. Support from your loved ones can make quitting easier.  Talk with your health care provider about your options for quitting smoking.  Find out what treatment  options are covered by your health insurance.  What strategies can I use to quit smoking? Talk with your healthcare provider about different strategies to quit smoking. Some strategies include:  Quitting smoking altogether instead of gradually lessening how much you smoke over a period of time. Research shows that quitting "cold turkey" is more successful than gradually quitting.  Attending in-person counseling to help you build problem-solving skills. You are more likely to have success in quitting if you attend several counseling sessions. Even short sessions of 10 minutes can be effective.  Finding resources and support systems that can help you to quit smoking and remain smoke-free after you quit. These resources are most helpful when you use them often. They can include: ? Online chats with a counselor. ? Telephone quitlines. ? Printed self-help materials. ? Support groups or group counseling. ? Text messaging programs. ? Mobile phone applications.  Taking medicines to help you quit smoking. (If you are pregnant or breastfeeding, talk with your health care provider first.) Some medicines contain nicotine and some do not. Both types of medicines help with cravings, but the medicines that include nicotine help to relieve withdrawal symptoms. Your health care provider may recommend: ? Nicotine patches, gum, or lozenges. ? Nicotine inhalers or sprays. ? Non-nicotine medicine that is taken by mouth.  Talk with your health care provider about combining strategies, such as taking medicines while you are also receiving in-person counseling. Using these two strategies together   makes you more likely to succeed in quitting than if you used either strategy on its own. If you are pregnant or breastfeeding, talk with your health care provider about finding counseling or other support strategies to quit smoking. Do not take medicine to help you quit smoking unless told to do so by your health care  provider. What things can I do to make it easier to quit? Quitting smoking might feel overwhelming at first, but there is a lot that you can do to make it easier. Take these important actions:  Reach out to your family and friends and ask that they support and encourage you during this time. Call telephone quitlines, reach out to support groups, or work with a counselor for support.  Ask people who smoke to avoid smoking around you.  Avoid places that trigger you to smoke, such as bars, parties, or smoke-break areas at work.  Spend time around people who do not smoke.  Lessen stress in your life, because stress can be a smoking trigger for some people. To lessen stress, try: ? Exercising regularly. ? Deep-breathing exercises. ? Yoga. ? Meditating. ? Performing a body scan. This involves closing your eyes, scanning your body from head to toe, and noticing which parts of your body are particularly tense. Purposefully relax the muscles in those areas.  Download or purchase mobile phone or tablet apps (applications) that can help you stick to your quit plan by providing reminders, tips, and encouragement. There are many free apps, such as QuitGuide from the CDC (Centers for Disease Control and Prevention). You can find other support for quitting smoking (smoking cessation) through smokefree.gov and other websites.  How will I feel when I quit smoking? Within the first 24 hours of quitting smoking, you may start to feel some withdrawal symptoms. These symptoms are usually most noticeable 2-3 days after quitting, but they usually do not last beyond 2-3 weeks. Changes or symptoms that you might experience include:  Mood swings.  Restlessness, anxiety, or irritation.  Difficulty concentrating.  Dizziness.  Strong cravings for sugary foods in addition to nicotine.  Mild weight gain.  Constipation.  Nausea.  Coughing or a sore throat.  Changes in how your medicines work in your  body.  A depressed mood.  Difficulty sleeping (insomnia).  After the first 2-3 weeks of quitting, you may start to notice more positive results, such as:  Improved sense of smell and taste.  Decreased coughing and sore throat.  Slower heart rate.  Lower blood pressure.  Clearer skin.  The ability to breathe more easily.  Fewer sick days.  Quitting smoking is very challenging for most people. Do not get discouraged if you are not successful the first time. Some people need to make many attempts to quit before they achieve long-term success. Do your best to stick to your quit plan, and talk with your health care provider if you have any questions or concerns. This information is not intended to replace advice given to you by your health care provider. Make sure you discuss any questions you have with your health care provider. Document Released: 05/24/2001 Document Revised: 01/26/2016 Document Reviewed: 10/14/2014 [employer] Interactive Patient Education  2018 [employer]  

## 2017-08-10 NOTE — Progress Notes (Signed)
SLEEP MEDICINE CLINIC   Provider:  Larey Seat, M D  Referring Provider: Janith Lima, MD Primary Care Physician:  Janith Lima, MD  Chief Complaint  Patient presents with  . Follow-up    pt alone, rm 10. pt states that CPAP is working well. DME [facility]    HPI:  Caleb Obrien is a 52 y.o. male , seen here as a referral  from Dr. Ronnald Ramp for a possible sleep study, I have the pleasure of meeting with Caleb Obrien today on 10 August 2017, this gentleman has been using CPAP compliantly and brought today a download is a 93% compliance for days and 24 of these days over 4 hours of consecutive use.  His average is 6 hours and 6 minutes of nightly use, he is using an AutoSet with a pressure window between 5 and 10 cmH2O and 3 cm EPR, his AHI is 0.5 and his 95th percentile pressure is 7.9 cm.  He does not have significant air leaks.  The needs to be no adjustment made as his apnea is very well controlled at the patient reports an improvement in daytime sleepiness and a noticeable difference on days he may not have to use CPAP the night before.  He has no doubt about the benefit.    Caleb Obrien underwent a sleep study attended in [place] about 3 years ago and afterwards was referred to for dental treatment with a mandibular advancement device. His claim was denied by his health insurance and resulted in a lot of costs to him. He never had the device made. He has used a dental device that he bought on the Internet, but he is not sure that it has helped. He is known to be a loud snorer and his snoring may not have been reduced at all as he describes, he also reports that he had choking and apnea attacks waking him out of sleep and these have continued while he used a new device. Snoring has been ongoing as has choking . He has not had a good night sleep, not refreshing nor of restoring quality. He is excessivly sleepy while driving, has tried a lot of caffeine through out the day and  scheduled stops - with minimal success. His overnight sleep deteriorated further while using caffeine.  Sleep habits are as follows: Usually goes to bed by 11 PM and falls asleep within 30 minutes or less. Bedroom is cool, quiet and dark. He is going to sleep usually on his sides but wakes  on his back more often.  He suffers from GERD which also makes sleep sometimes less pleasant. He takes ranitidine over-the-counter in AM ! He props his head with 2 pillows, but he has continued to notice heavy snoring and wakes up from choking and apneic spells. He has allergic airway disease, but stopped antihistamines. His nose is frequently runny.   He does not have shortness of breath, palpitations, dependency edema, and has not experienced any fainting spells.  He does not have orthostasis, sometimes has a mild headache with elevated blood pressures, he does wake up clammy and diaphoretic however. Usually he has one nocturnal bathroom break and wakes with the urge of urination. He usually does not wake up with headaches in the morning. He does not have a dry mouth in the morning, and she does not drink water during the night. Wakes up at 5.30 by alarm, read the news in bed than rises. He has not longer taken caffeine  in AM.    I reviewed the patient's sleep study from [facility], performed on 06/04/2013 he had an AHI of 21.1 which places him in the moderate category. During sleep supine his AHI was 29 per hour of sleep, and in nonsupine 9.7 times per hour of sleep there was a strong positional component to his apnea. There are no central apneas noted, his heart rate stayed regular and in sinus rhythm throughout the night, oxygen nadir was 89%, typically not concerning and he had 3.9 PLM arousals during the diagnostic type and 2.8 per hour during the titration to CPAP which ended with a pressure of only 6 cm water pressure. He responded apparently very well to this rather low pressure and  achieved a sleep time of 86 minutes under CPAP including 26.5 minutes of REM sleep. Sleep medical history and family sleep history: Mother has OSA and uses CPAP.  Caleb Obrien has a history of hypertension, hypercholesterolemia, depression with more anxiety than depression manifestation, he very rarely takes Xanax which was prescribed for anxiety attacks, he has Lipitor, Wellbutrin, Zyrtec as needed, Xopenex  Inhaler- as needed for wheezing, Zantac, Cialis, Ibusartin.   Social history:   Caffeine - not longer using, ETOH weekends and week days, daily- liquor , 2 glasses, he quit smoking just 3 weeks ago and used to smoke two cigs a day.  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 He is single,  one child ( son, 20 ). Caleb Obrien sleep study is 25 years old but his body mass index has not significantly changed, he is still in the 160 pound body weight range. I will order an auto-titration for him, 5-10 cm water, no EPR. I like for the DME to fit him to a nasal pillow or nasal mask, such as a pillairo,  I gave him a ballpark estimate for the CPAP cost as he has a high deductible health plan. And he may hold off to January 2018 depending on the expected costs. I will also introduce into the tennis ball method to avoid supine sleep which accentuated his apnea significantly. I have further advised him to take ranitidine at night and not in the morning as it has sleepy making qualities.  Interval history from 08/09/2016, Caleb Obrien is here for his first follow-up after O2 titration. He has been using the machine 27 out of 30 days over 4 hours, achieving at 93% compliance which is excellent. Average daily use a time is 6 hours and 40 minutes the AutoSet is set between 5 and 10 cm water with 3 cm EPR and reduced his AH I, also known as apnea hypopnea index, to 2.6. There are no major air leaks, and nasal pillows  have been comfortable, the patient had no problem to sleep using CPAP. His current Epworth Sleepiness Scale is endorsed at 5 points, fatigue severity scale is endorsed at 20 points.    Review of Systems: Out of a complete 14 system review, the patient complains of only the following symptoms, and all other reviewed systems are negative. He has recently had some scary experiences where he felt overwhelmed by the need to sleep while driving. He has been known to snore, he reports sleep choking, witnessed snoring, excessive daytime sleepiness, fatigue.  currently treated for anxiety and depression.   FSS at 2-29-2019 - 14 points, Epworth score is 2/24.  Social History   Socioeconomic History  . Marital status: Single    Spouse name: Not on file  . Number of children: Not on file  . Years of education: Not on file  . Highest education level: Not on file  Social Needs  . Financial resource strain: Not on file  . Food insecurity - worry: Not on file  . Food insecurity - inability: Not on file  . Transportation needs - medical: Not on file  . Transportation needs - non-medical: Not on file  Occupational History  . Occupation: HR    Employer: [employer]  Tobacco Use  . Smoking status: Light Tobacco Smoker    Packs/day: 0.30    Types: Cigarettes  . Smokeless tobacco: Never Used  Substance and Sexual Activity  . Alcohol use: Yes    Alcohol/week: 1.5 oz    Types: 3 drink(s) per week    Comment: 2 beverages daily  . Drug use: No  . Sexual activity: Yes  Other Topics Concern  . Not on file  Social History Narrative  . Not on file    Family History  Problem Relation Age of Onset  . Hypertension Mother   . Stroke Mother   . Arthritis Mother   . Prostate cancer Father     Past Medical History:  Diagnosis Date  . ALLERGIC RHINITIS CAUSE UNSPECIFIED 02/24/2009  . ASTHMA 02/05/2007  . BENIGN PROSTATIC HYPERTROPHY, WITH URINARY OBSTRUCTION 05/22/2007  .  Dermatophytosis of foot 11/02/2009  . GANGLION CYST, WRIST, LEFT 03/25/2009  . Hemangioma of skin and subcutaneous tissue 06/30/2009  . HEPATITIS B, HX OF 02/05/2007  . [place], [place] 04/21/2008  . PLANTAR WART, RIGHT 01/23/2009    Past Surgical History:  Procedure Laterality Date  . WISDOM TOOTH EXTRACTION      Current Outpatient Medications  Medication Sig Dispense Refill  . ALPRAZolam (XANAX) 0.25 MG tablet TAKE 1/2 TABLET BY MOUTH AS NEEDED 30 tablet 3  . aspirin EC 81 MG tablet Take 1 tablet (81 mg total) by mouth daily. 90 tablet 3  . atorvastatin (LIPITOR) 40 MG tablet TAKE ONE TABLET BY MOUTH DAILY 90 tablet 1  . buPROPion (WELLBUTRIN SR) 100 MG 12 hr tablet Take 1 tablet (100 mg total) by mouth 2 (two) times daily. 180 tablet 3  . cetirizine (ZYRTEC) 10 MG tablet TAKE 1 TABLET(10 MG) BY MOUTH DAILY 30 tablet 11  . emtricitabine-tenofovir (TRUVADA) 200-300 MG tablet Take 1 tablet by mouth daily. 90 tablet 1  . irbesartan (AVAPRO) 300 MG tablet Take 1 tablet (300 mg total) by mouth daily. 90 tablet 1  . ranitidine (ZANTAC) 75 MG tablet Take 2 tablets (150 mg total) by mouth at bedtime. 60 tablet 1  . tadalafil (CIALIS) 5 MG tablet Take 1 tablet (5 mg total) by mouth daily as needed for erectile dysfunction. 10 tablet 5   No current facility-administered medications for this visit.     Allergies as of 08/10/2017 - Review Complete 08/10/2017  Allergen Reaction Noted  . Amlodipine  02/07/2014  . Lisinopril  02/07/2014  . Sudafed [pseudoephedrine hcl] Other (See Comments) 11/26/2014    Vitals: BP 138/89   Pulse 86   Ht 5\' 8"  (1.727 m)   Wt 159 lb (72.1 kg)   BMI 24.18 kg/m  Last Weight:  Wt Readings from Last 1 Encounters:  08/10/17 159 lb (72.1 kg)   GNF:AOZH mass index is 24.18 kg/m.     Last Height:   Ht Readings from  Last 1 Encounters:  08/10/17 5\' 8"  (1.727 m)    Physical exam:  General: The patient is awake, alert and appears not in acute distress.  The patient is well groomed. Full facial hair.  Head: Normocephalic, atraumatic.  Neck is supple. Mallampati  2 neck circumference:16.25 . Nasal airflow constricted , TMJ click evident . Retrognathia is  Not seen.  Cardiovascular:  Regular rate and rhythm, without  murmurs or carotid bruit, and without distended neck veins. Respiratory: Lungs are clear to auscultation. Skin:  Without evidence of edema, or rash Trunk: BMI is 26 . The patient's posture is erect   Neurologic exam : The patient is awake and alert, oriented to place and time.   Attention span & concentration ability appears normal.  Speech is fluent,  without  dysarthria, dysphonia or aphasia.  Mood and affect are appropriate.  Cranial nerves: Pupils are equal and briskly reactive to light. Extraocular movements  in vertical and horizontal planes intact and without nystagmus. Visual fields by finger perimetry are intact. Hearing to finger rub intact. Facial sensation intact to fine touch. Facial motor strength is symmetric and tongue and uvula move midline. Shoulder shrug was symmetrical.   The patient was advised of the nature of the diagnosed sleep disorder ( OSA ) , the treatment options and risks for general a health and wellness arising from not treating the condition.  I spent more than  15 minutes of face to face time with the patient. Greater than 50% of time was spent in counseling and coordination of care. We have discussed the diagnosis and differential and I answered the patient's questions.      Dear Gershon Mussel,  Thank you again for sending this patient my way. I hope you continue to be happy with our services. Caleb Obrien has been a highly compliant CPAP patient who reports the benefits of therapy. We discussed briefly how to keep machine , humidity chamber and tubing clean. I recommend vinegar water solution to rinse these parts- and mild soap for the nasal pillows.   He is to ontinue CPAP auto titration 5-10 cm  water 3 cm EPR, nasal pillows, and stays with [facility]. DME .  Rv in 12 month   Asencion Partridge Lisanne Ponce MD  08/10/2017   CC: Janith Lima, Md [address] [facility] [address] [place], [place] [address]

## 2017-08-22 ENCOUNTER — Ambulatory Visit: Payer: BLUE CROSS/BLUE SHIELD | Admitting: Internal Medicine

## 2017-08-22 ENCOUNTER — Encounter: Payer: Self-pay | Admitting: Internal Medicine

## 2017-08-22 ENCOUNTER — Other Ambulatory Visit (INDEPENDENT_AMBULATORY_CARE_PROVIDER_SITE_OTHER): Payer: BLUE CROSS/BLUE SHIELD

## 2017-08-22 VITALS — BP 142/96 | HR 88 | Temp 98.6°F | Resp 16 | Ht 68.0 in | Wt 155.0 lb

## 2017-08-22 DIAGNOSIS — Z7252 High risk homosexual behavior: Secondary | ICD-10-CM

## 2017-08-22 DIAGNOSIS — F418 Other specified anxiety disorders: Secondary | ICD-10-CM | POA: Diagnosis not present

## 2017-08-22 DIAGNOSIS — I10 Essential (primary) hypertension: Secondary | ICD-10-CM

## 2017-08-22 LAB — BASIC METABOLIC PANEL
BUN: 16 mg/dL (ref 6–23)
CO2: 31 mEq/L (ref 19–32)
Calcium: 10.2 mg/dL (ref 8.4–10.5)
Chloride: 100 mEq/L (ref 96–112)
Creatinine, Ser: 0.98 mg/dL (ref 0.40–1.50)
GFR: 86.06 mL/min (ref >60.00)
Glucose, Bld: 104 mg/dL — ABNORMAL HIGH (ref 70–99)
Potassium: 4.2 mEq/L (ref 3.5–5.1)
Sodium: 138 mEq/L (ref 135–145)

## 2017-08-22 LAB — URINALYSIS, ROUTINE W REFLEX MICROSCOPIC
Bilirubin Urine: NEGATIVE
Hgb urine dipstick: NEGATIVE
Ketones, ur: NEGATIVE
Leukocytes, UA: NEGATIVE
Nitrite: NEGATIVE
Specific Gravity, Urine: 1.02 (ref 1.000–1.030)
Total Protein, Urine: NEGATIVE
Urine Glucose: NEGATIVE
Urobilinogen, UA: 0.2 (ref 0.0–1.0)
pH: 7 (ref 5.0–8.0)

## 2017-08-22 MED ORDER — IRBESARTAN-HYDROCHLOROTHIAZIDE 300-12.5 MG PO TABS: 1.0000 | ORAL_TABLET | Freq: Every day | ORAL | 1 refills | Status: DC

## 2017-08-22 MED ORDER — TRAZODONE HCL 150 MG PO TABS: 150.0000 mg | ORAL_TABLET | Freq: Every day | ORAL | 1 refills | Status: DC

## 2017-08-22 NOTE — Progress Notes (Signed)
Subjective:  Patient ID: Caleb Obrien, male    DOB: 1966-05-25  Age: 52 y.o. MRN: [id number]  CC: Hypertension   HPI Caleb Obrien presents for f/up - He is concerned that his blood pressure has not recently been well controlled.  About 4 or 5 days ago he checked his BP for several days and the numbers were consistently around 151-154/102-103.  During that time he also had a mild headache.  He is compliant with the irbesartan.  He smokes a few cigarettes a day.  He also complains of insomnia, anhedonia, and anxiety.  He wants to continue taking PrEP.  He considers his sexual activity to be relatively high risk.  Outpatient Medications Prior to Visit  Medication Sig Dispense Refill  . ALPRAZolam (XANAX) 0.25 MG tablet TAKE 1/2 TABLET BY MOUTH AS NEEDED 30 tablet 3  . aspirin EC 81 MG tablet Take 1 tablet (81 mg total) by mouth daily. 90 tablet 3  . atorvastatin (LIPITOR) 40 MG tablet TAKE ONE TABLET BY MOUTH DAILY 90 tablet 1  . buPROPion (WELLBUTRIN SR) 100 MG 12 hr tablet Take 1 tablet (100 mg total) by mouth 2 (two) times daily. 180 tablet 3  . cetirizine (ZYRTEC) 10 MG tablet TAKE 1 TABLET(10 MG) BY MOUTH DAILY 30 tablet 11  . ranitidine (ZANTAC) 75 MG tablet Take 2 tablets (150 mg total) by mouth at bedtime. 60 tablet 1  . tadalafil (CIALIS) 5 MG tablet Take 1 tablet (5 mg total) by mouth daily as needed for erectile dysfunction. 10 tablet 5  . emtricitabine-tenofovir (TRUVADA) 200-300 MG tablet Take 1 tablet by mouth daily. 90 tablet 1  . irbesartan (AVAPRO) 300 MG tablet Take 1 tablet (300 mg total) by mouth daily. 90 tablet 1   No facility-administered medications prior to visit.     ROS Review of Systems  Constitutional: Negative.  Negative for diaphoresis, fatigue and fever.  HENT: Negative.  Negative for sore throat, trouble swallowing and voice change.   Eyes: Negative for visual disturbance.  Respiratory: Positive for apnea. Negative for cough, chest tightness,  shortness of breath and wheezing.   Gastrointestinal: Negative for abdominal pain, constipation, nausea, rectal pain and vomiting.  Endocrine: Negative.   Genitourinary: Negative.  Negative for difficulty urinating, discharge, dysuria, genital sores, scrotal swelling, testicular pain and urgency.  Musculoskeletal: Negative.  Negative for back pain, myalgias and neck pain.  Skin: Negative.  Negative for pallor and rash.  Allergic/Immunologic: Negative.   Neurological: Negative.  Negative for dizziness.  Hematological: Negative for adenopathy. Does not bruise/bleed easily.  Psychiatric/Behavioral: Positive for dysphoric mood and sleep disturbance. Negative for confusion, decreased concentration, self-injury and suicidal ideas. The patient is nervous/anxious.     Objective:  BP (!) 142/96 (BP Location: Left Arm, Patient Position: Sitting, Cuff Size: Normal)   Pulse 88   Temp 98.6 F (37 C) (Oral)   Resp 16   Ht 5\' 8"  (1.727 m)   Wt 155 lb (70.3 kg)   SpO2 98%   BMI 23.57 kg/m   BP Readings from Last 3 Encounters:  08/22/17 (!) 142/96  08/10/17 138/89  05/18/17 138/90    Wt Readings from Last 3 Encounters:  08/22/17 155 lb (70.3 kg)  08/10/17 159 lb (72.1 kg)  05/18/17 156 lb 12 oz (71.1 kg)    Physical Exam  Constitutional: He is oriented to person, place, and time. No distress.  HENT:  Mouth/Throat: Oropharynx is clear and moist. No oropharyngeal exudate.  Eyes: Conjunctivae  are normal. Left eye exhibits no discharge. No scleral icterus.  Neck: Normal range of motion. Neck supple. No JVD present. No thyromegaly present.  Cardiovascular: Normal rate, regular rhythm and normal heart sounds. Exam reveals no gallop.  No murmur heard. Pulmonary/Chest: Effort normal and breath sounds normal. No respiratory distress. He has no wheezes. He has no rales.  Abdominal: Soft. Bowel sounds are normal. He exhibits no distension and no mass. There is no tenderness. There is no guarding.    Musculoskeletal: Normal range of motion. He exhibits no edema, tenderness or deformity.  Lymphadenopathy:    He has no cervical adenopathy.  Neurological: He is alert and oriented to person, place, and time.  Skin: Skin is warm and dry. No rash noted. He is not diaphoretic. No erythema. No pallor.  Psychiatric: His behavior is normal. Judgment and thought content normal. His mood appears anxious. His speech is not rapid and/or pressured, not delayed and not tangential. He is not slowed and not withdrawn. Cognition and memory are normal. He does not exhibit a depressed mood. He expresses no homicidal and no suicidal ideation.  Vitals reviewed.   Lab Results  Component Value Date   WBC 5.7 05/18/2017   HGB 15.6 05/18/2017   HCT 46.1 05/18/2017   PLT 307.0 05/18/2017   GLUCOSE 104 (H) 08/22/2017   CHOL 135 05/18/2017   TRIG 56.0 05/18/2017   HDL 53.00 05/18/2017   LDLDIRECT 144.4 01/27/2012   LDLCALC 70 05/18/2017   ALT 30 05/18/2017   AST 22 05/18/2017   NA 138 08/22/2017   K 4.2 08/22/2017   CL 100 08/22/2017   CREATININE 0.98 08/22/2017   BUN 16 08/22/2017   CO2 31 08/22/2017   TSH 2.95 05/18/2017   PSA 0.82 05/18/2017    Dg Ribs Unilateral W/chest Left  Result Date: 04/06/2016 CLINICAL DATA:  Fall 4 days ago with left-sided chest pain EXAM: LEFT RIBS AND CHEST - 3+ VIEW COMPARISON:  01/24/2014 FINDINGS: Cardiac shadow is within normal limits. The lungs are clear bilaterally. No pneumothorax or sizable effusion is noted. Minimal irregularity of the anterior aspect of the left sixth rib is noted which may represent an undisplaced fracture. No other fractures are noted. IMPRESSION: Irregularity of the anterior aspect of the left sixth rib which may represent an undisplaced fracture. Electronically Signed   By: Inez Catalina M.D.   On: 04/06/2016 15:14    Assessment & Plan:   [name] was seen today for hypertension.  Diagnoses and all orders for this visit:  Depression with  anxiety -     traZODone (DESYREL) 150 MG tablet; Take 1 tablet (150 mg total) by mouth at bedtime.  Essential hypertension, benign- His blood pressure is not adequately well controlled.  His lab work is negative for secondary causes or endorgan damage.  I will add a thiazide diuretic to the ARB. -     irbesartan-hydrochlorothiazide (AVALIDE) 300-12.5 MG tablet; Take 1 tablet by mouth daily. -     Basic metabolic panel; Future -     Urinalysis, Routine w reflex microscopic; Future  High risk homosexual behavior - His creatinine clearance is above 60 and screening for HIV and hep B infection is negative.  He can continue taking Truvada. -     Basic metabolic panel; Future -     RPR; Future -     Hepatitis B surface antigen; Future -     HIV antibody; Future   I have discontinued Jeneen Rinks C. Yonker's irbesartan. I  am also having him start on traZODone and irbesartan-hydrochlorothiazide. Additionally, I am having him maintain his ranitidine, cetirizine, tadalafil, aspirin EC, ALPRAZolam, atorvastatin, buPROPion, and emtricitabine-tenofovir.  Meds ordered this encounter  Medications  . traZODone (DESYREL) 150 MG tablet    Sig: Take 1 tablet (150 mg total) by mouth at bedtime.    Dispense:  90 tablet    Refill:  1  . irbesartan-hydrochlorothiazide (AVALIDE) 300-12.5 MG tablet    Sig: Take 1 tablet by mouth daily.    Dispense:  90 tablet    Refill:  1  . emtricitabine-tenofovir (TRUVADA) 200-300 MG tablet    Sig: Take 1 tablet by mouth daily.    Dispense:  90 tablet    Refill:  1     Follow-up: Return in about 3 months (around 11/22/2017).  Scarlette Calico, MD

## 2017-08-22 NOTE — Patient Instructions (Signed)

## 2017-08-23 ENCOUNTER — Encounter: Payer: Self-pay | Admitting: Internal Medicine

## 2017-08-23 LAB — HIV ANTIBODY (ROUTINE TESTING W REFLEX): HIV 1&2 Ab, 4th Generation: NONREACTIVE

## 2017-08-23 LAB — HEPATITIS B SURFACE ANTIGEN: Hepatitis B Surface Ag: NONREACTIVE

## 2017-08-23 LAB — RPR: RPR Ser Ql: NONREACTIVE

## 2017-08-24 MED ORDER — EMTRICITABINE-TENOFOVIR DF 200-300 MG PO TABS: 1.0000 | ORAL_TABLET | Freq: Every day | ORAL | 1 refills | Status: DC

## 2017-09-08 DIAGNOSIS — G4733 Obstructive sleep apnea (adult) (pediatric): Secondary | ICD-10-CM | POA: Diagnosis not present

## 2017-09-11 DIAGNOSIS — Z713 Dietary counseling and surveillance: Secondary | ICD-10-CM | POA: Diagnosis not present

## 2017-10-25 ENCOUNTER — Encounter: Payer: Self-pay | Admitting: Neurology

## 2017-10-27 ENCOUNTER — Encounter: Payer: Self-pay | Admitting: Neurology

## 2017-11-03 ENCOUNTER — Other Ambulatory Visit: Payer: Self-pay | Admitting: Internal Medicine

## 2017-11-03 DIAGNOSIS — Z7252 High risk homosexual behavior: Secondary | ICD-10-CM

## 2017-11-15 ENCOUNTER — Other Ambulatory Visit: Payer: Self-pay | Admitting: Internal Medicine

## 2017-11-15 DIAGNOSIS — E785 Hyperlipidemia, unspecified: Secondary | ICD-10-CM

## 2017-11-24 IMAGING — DX DG RIBS W/ CHEST 3+V*L*
3 series · 3 of 3 positions shown · non-contrast
Comparison: 01/24/2014

CLINICAL DATA: Fall 4 days ago with left-sided chest pain

EXAM:
LEFT RIBS AND CHEST - 3+ VIEW

[chest pa]
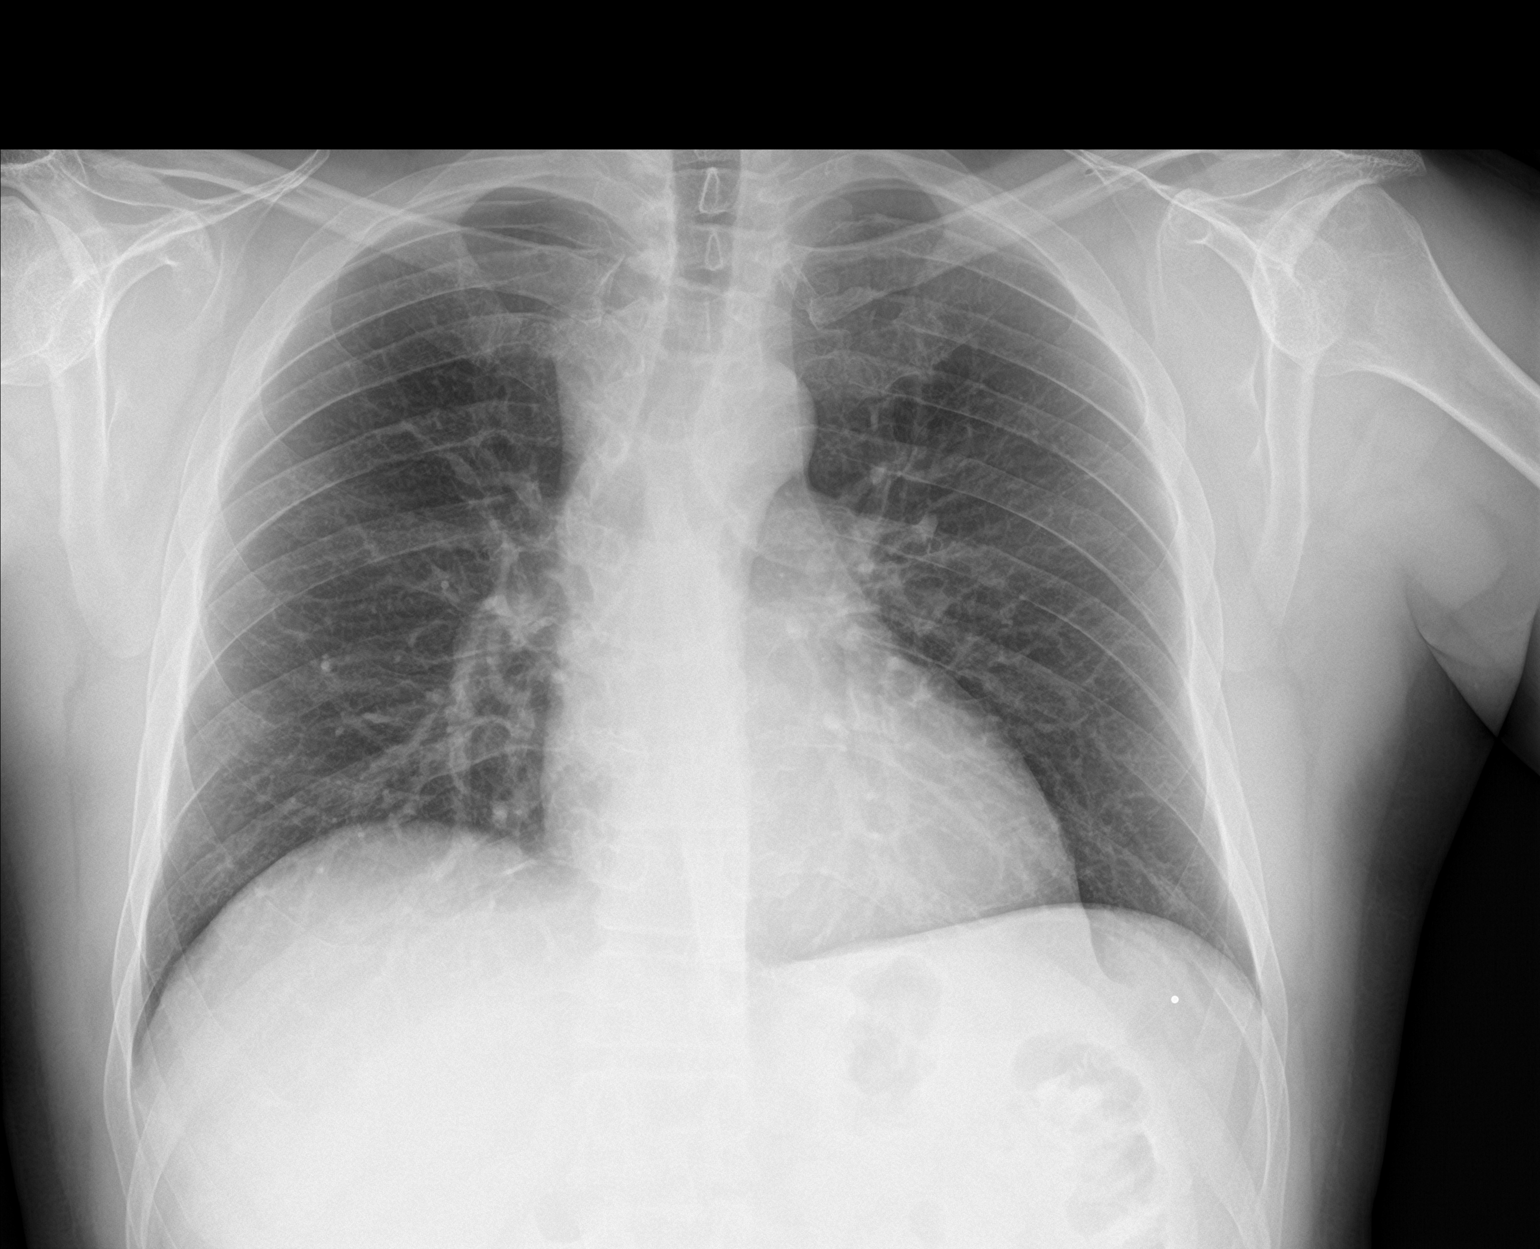

[rib pa]
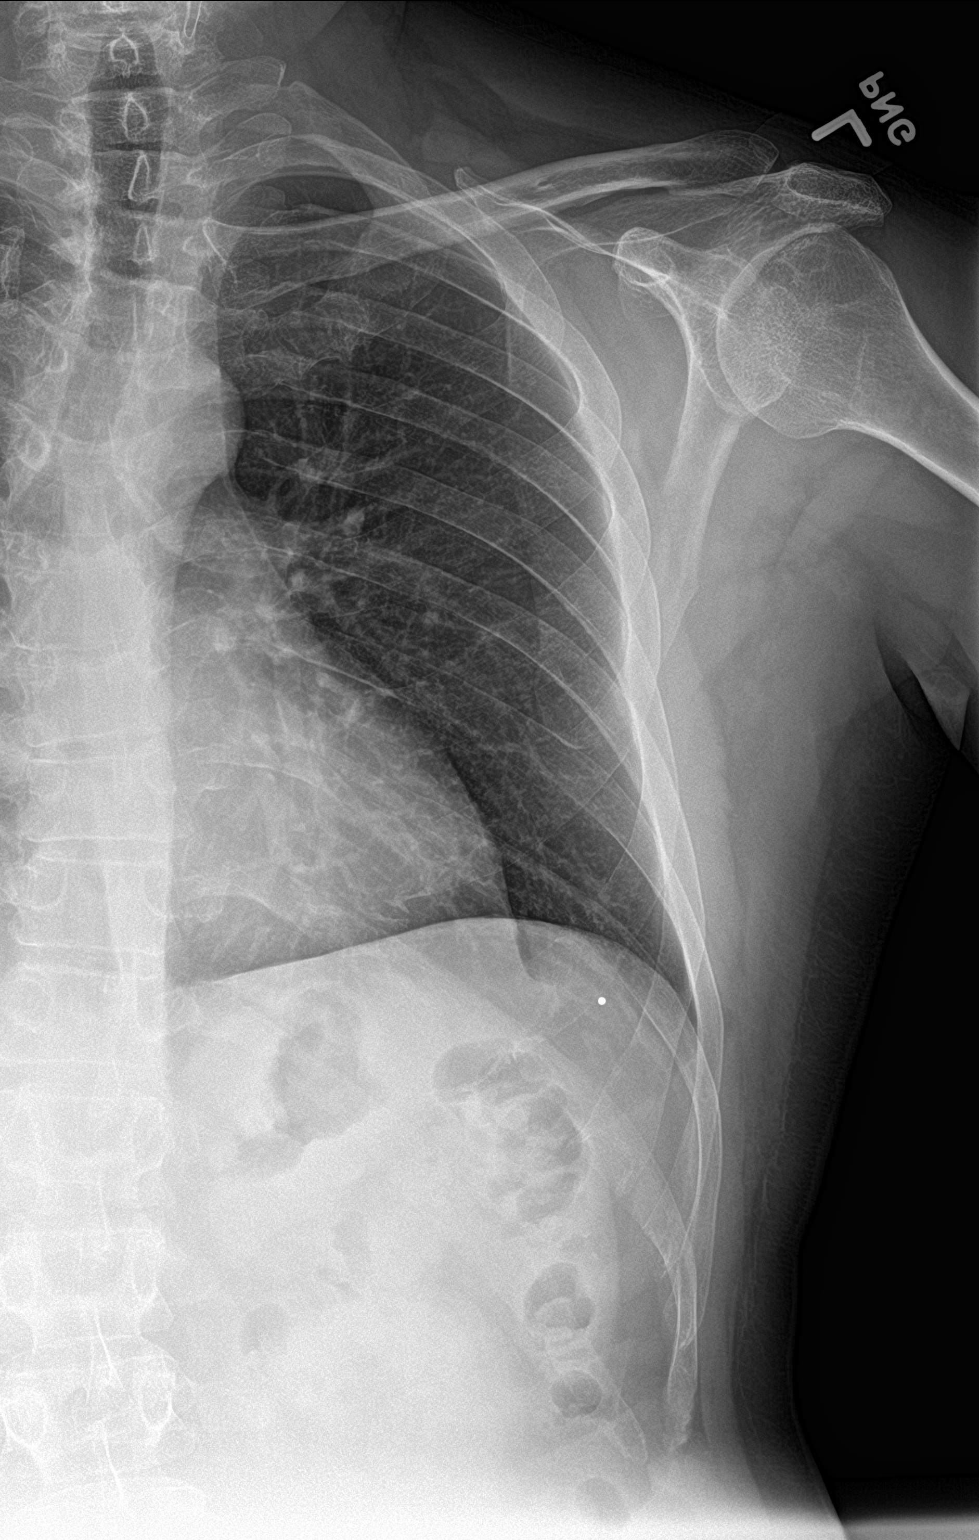

[rib obl]
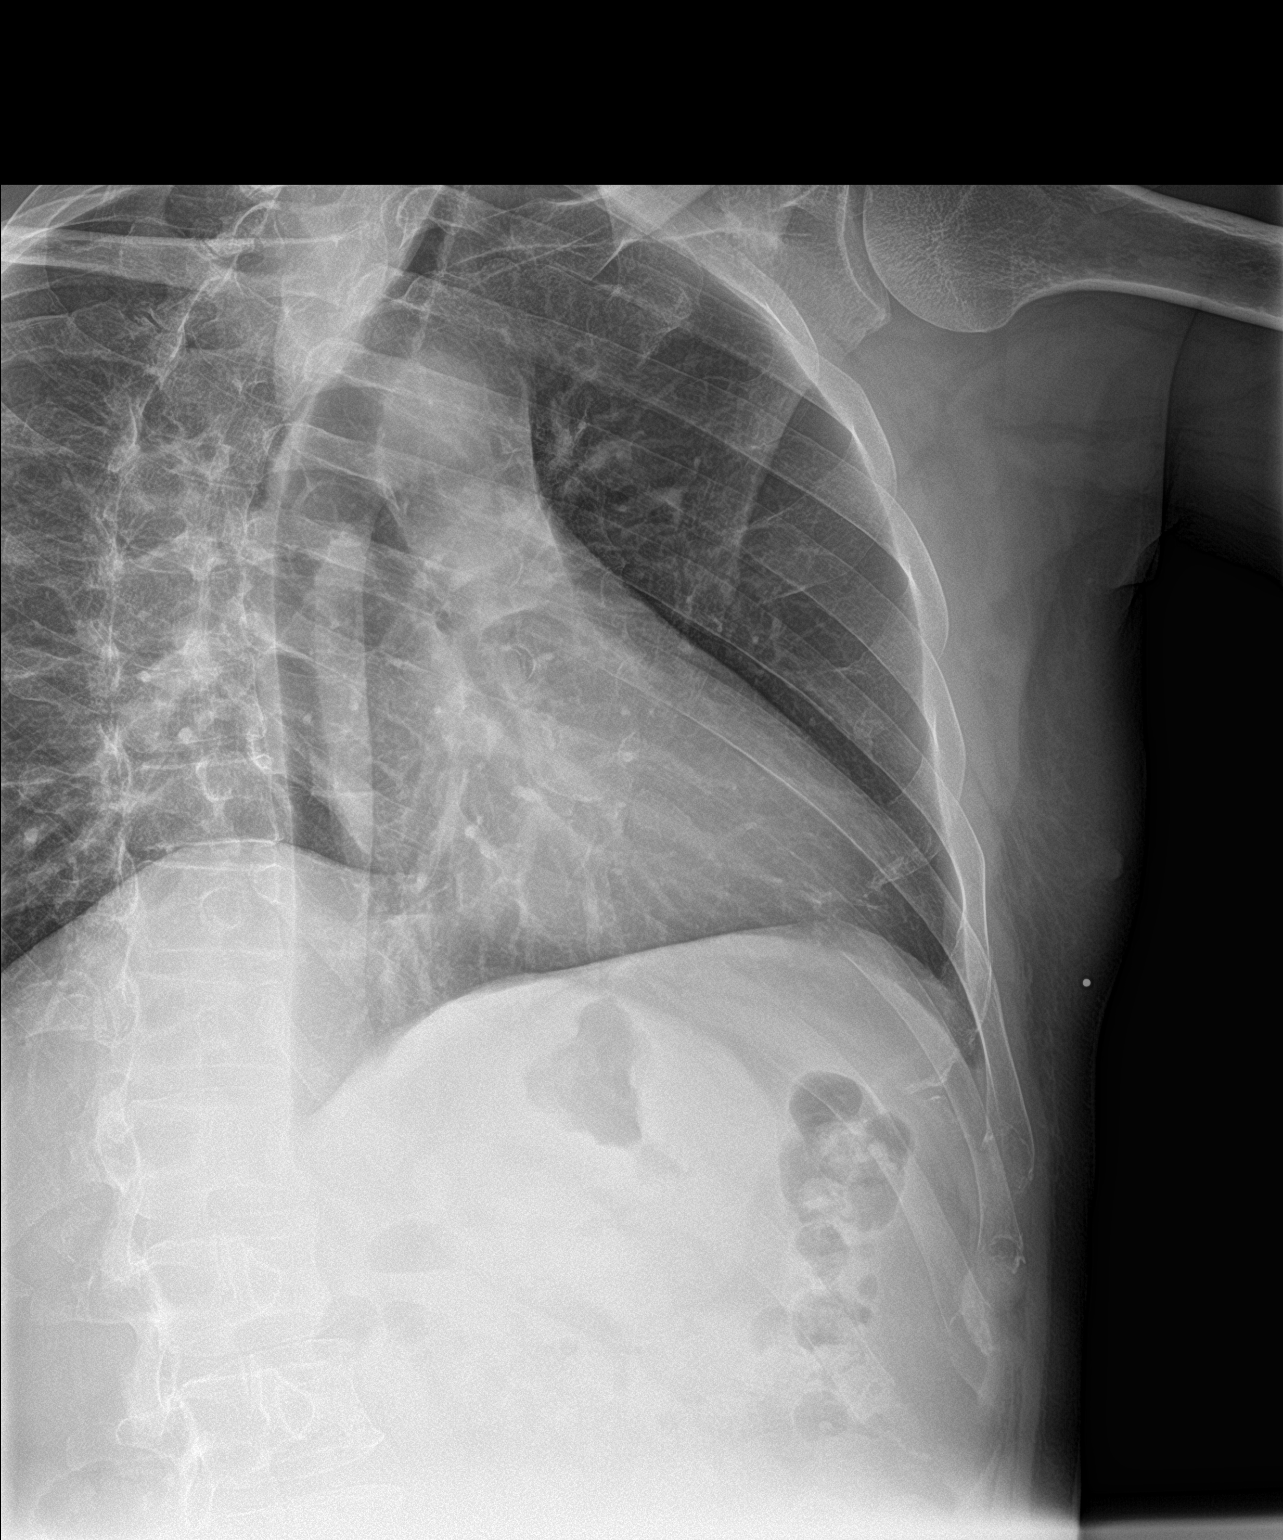

[3 of 3 positions shown; findings below may reference images not displayed]

FINDINGS: Cardiac shadow is within normal limits. The lungs are clear
bilaterally. No pneumothorax or sizable effusion is noted. Minimal
irregularity of the anterior aspect of the left sixth rib is noted
which may represent an undisplaced fracture. No other fractures are
noted.
IMPRESSION: Irregularity of the anterior aspect of the left sixth rib which may
represent an undisplaced fracture.

## 2017-12-05 DIAGNOSIS — G4733 Obstructive sleep apnea (adult) (pediatric): Secondary | ICD-10-CM | POA: Diagnosis not present

## 2017-12-26 ENCOUNTER — Ambulatory Visit: Payer: BLUE CROSS/BLUE SHIELD | Admitting: Internal Medicine

## 2017-12-26 ENCOUNTER — Encounter: Payer: Self-pay | Admitting: Internal Medicine

## 2017-12-26 ENCOUNTER — Other Ambulatory Visit: Payer: BLUE CROSS/BLUE SHIELD

## 2017-12-26 ENCOUNTER — Other Ambulatory Visit (INDEPENDENT_AMBULATORY_CARE_PROVIDER_SITE_OTHER): Payer: BLUE CROSS/BLUE SHIELD

## 2017-12-26 VITALS — BP 134/84 | HR 81 | Temp 98.1°F | Resp 16 | Ht 68.0 in | Wt 160.0 lb

## 2017-12-26 DIAGNOSIS — Z7252 High risk homosexual behavior: Secondary | ICD-10-CM

## 2017-12-26 DIAGNOSIS — Z1211 Encounter for screening for malignant neoplasm of colon: Secondary | ICD-10-CM

## 2017-12-26 DIAGNOSIS — I10 Essential (primary) hypertension: Secondary | ICD-10-CM

## 2017-12-26 DIAGNOSIS — Z1212 Encounter for screening for malignant neoplasm of rectum: Secondary | ICD-10-CM

## 2017-12-26 DIAGNOSIS — E559 Vitamin D deficiency, unspecified: Secondary | ICD-10-CM | POA: Diagnosis not present

## 2017-12-26 LAB — BASIC METABOLIC PANEL
BUN: 19 mg/dL (ref 6–23)
CO2: 34 mEq/L — ABNORMAL HIGH (ref 19–32)
Calcium: 9.8 mg/dL (ref 8.4–10.5)
Chloride: 100 mEq/L (ref 96–112)
Creatinine, Ser: 1.01 mg/dL (ref 0.40–1.50)
GFR: 83 mL/min (ref >60.00)
Glucose, Bld: 108 mg/dL — ABNORMAL HIGH (ref 70–99)
Potassium: 3.9 mEq/L (ref 3.5–5.1)
Sodium: 139 mEq/L (ref 135–145)

## 2017-12-26 LAB — URINALYSIS, ROUTINE W REFLEX MICROSCOPIC
Bilirubin Urine: NEGATIVE
Hgb urine dipstick: NEGATIVE
Ketones, ur: NEGATIVE
Leukocytes, UA: NEGATIVE
Nitrite: NEGATIVE
Specific Gravity, Urine: 1.01 (ref 1.000–1.030)
Total Protein, Urine: NEGATIVE
Urine Glucose: NEGATIVE
Urobilinogen, UA: 0.2 (ref 0.0–1.0)
pH: 7 (ref 5.0–8.0)

## 2017-12-26 NOTE — Patient Instructions (Signed)

## 2017-12-26 NOTE — Progress Notes (Signed)
Subjective:  Patient ID: Caleb Obrien, male    DOB: 1966-01-28  Age: 52 y.o. MRN: [id number]  CC: Hypertension   HPI Caleb Obrien presents for f/up - He tells me his blood pressure has been well controlled.  He has felt well recently and offers no complaints today.  He continues to consider his sexual activity to be relatively high risk and wants to be screened for STIs and to continue taking Truvada.  Outpatient Medications Prior to Visit  Medication Sig Dispense Refill  . ALPRAZolam (XANAX) 0.25 MG tablet TAKE 1/2 TABLET BY MOUTH AS NEEDED 30 tablet 3  . aspirin EC 81 MG tablet Take 1 tablet (81 mg total) by mouth daily. 90 tablet 3  . atorvastatin (LIPITOR) 40 MG tablet TAKE ONE TABLET BY MOUTH DAILY 90 tablet 0  . buPROPion (WELLBUTRIN SR) 100 MG 12 hr tablet Take 1 tablet (100 mg total) by mouth 2 (two) times daily. 180 tablet 3  . cetirizine (ZYRTEC) 10 MG tablet TAKE 1 TABLET(10 MG) BY MOUTH DAILY 30 tablet 11  . irbesartan-hydrochlorothiazide (AVALIDE) 300-12.5 MG tablet Take 1 tablet by mouth daily. 90 tablet 1  . ranitidine (ZANTAC) 75 MG tablet Take 2 tablets (150 mg total) by mouth at bedtime. 60 tablet 1  . tadalafil (CIALIS) 5 MG tablet Take 1 tablet (5 mg total) by mouth daily as needed for erectile dysfunction. 10 tablet 5  . TRUVADA 200-300 MG tablet TAKE 1 TABLET BY MOUTH DAILY 90 tablet 0  . emtricitabine-tenofovir (TRUVADA) 200-300 MG tablet Take 1 tablet by mouth daily. (Patient not taking: Reported on 12/26/2017) 90 tablet 1  . traZODone (DESYREL) 150 MG tablet Take 1 tablet (150 mg total) by mouth at bedtime. (Patient not taking: Reported on 12/26/2017) 90 tablet 1   No facility-administered medications prior to visit.     ROS Review of Systems  Constitutional: Negative for chills, diaphoresis, fatigue and fever.  HENT: Negative.  Negative for sore throat and trouble swallowing.   Eyes: Negative for visual disturbance.  Respiratory: Negative for cough,  chest tightness, shortness of breath and wheezing.   Cardiovascular: Negative for chest pain, palpitations and leg swelling.  Gastrointestinal: Negative for abdominal pain, constipation, diarrhea, nausea and vomiting.  Genitourinary: Negative.  Negative for difficulty urinating, dysuria, genital sores, scrotal swelling, testicular pain and urgency.  Musculoskeletal: Negative.  Negative for arthralgias and myalgias.  Skin: Negative.  Negative for rash.  Neurological: Negative.  Negative for headaches.  Hematological: Negative for adenopathy. Does not bruise/bleed easily.  Psychiatric/Behavioral: Negative.     Objective:  BP 134/84 (BP Location: Left Arm, Patient Position: Sitting, Cuff Size: Normal)   Pulse 81   Temp 98.1 F (36.7 C) (Oral)   Resp 16   Ht 5\' 8"  (1.727 m)   Wt 160 lb (72.6 kg)   SpO2 98%   BMI 24.33 kg/m   BP Readings from Last 3 Encounters:  12/26/17 134/84  08/22/17 (!) 142/96  08/10/17 138/89    Wt Readings from Last 3 Encounters:  12/26/17 160 lb (72.6 kg)  08/22/17 155 lb (70.3 kg)  08/10/17 159 lb (72.1 kg)    Physical Exam  Constitutional: He is oriented to person, place, and time. No distress.  HENT:  Mouth/Throat: Oropharynx is clear and moist. No oropharyngeal exudate.  Eyes: Conjunctivae are normal. No scleral icterus.  Neck: Normal range of motion. Neck supple. No JVD present. No thyromegaly present.  Cardiovascular: Normal rate, regular rhythm and normal heart sounds.  Pulmonary/Chest: Effort normal and breath sounds normal. He has no wheezes. He has no rales.  Abdominal: Soft. Normal appearance and bowel sounds are normal. He exhibits no mass. There is no hepatosplenomegaly. There is no tenderness.  Musculoskeletal: Normal range of motion. He exhibits no edema, tenderness or deformity.  Lymphadenopathy:    He has no cervical adenopathy.  Neurological: He is alert and oriented to person, place, and time.  Skin: Skin is warm and dry. No rash  noted. He is not diaphoretic.  Vitals reviewed.   Lab Results  Component Value Date   WBC 5.7 05/18/2017   HGB 15.6 05/18/2017   HCT 46.1 05/18/2017   PLT 307.0 05/18/2017   GLUCOSE 108 (H) 12/26/2017   CHOL 135 05/18/2017   TRIG 56.0 05/18/2017   HDL 53.00 05/18/2017   LDLDIRECT 144.4 01/27/2012   LDLCALC 70 05/18/2017   ALT 30 05/18/2017   AST 22 05/18/2017   NA 139 12/26/2017   K 3.9 12/26/2017   CL 100 12/26/2017   CREATININE 1.01 12/26/2017   BUN 19 12/26/2017   CO2 34 (H) 12/26/2017   TSH 2.95 05/18/2017   PSA 0.82 05/18/2017    Dg Ribs Unilateral W/chest Left  Result Date: 04/06/2016 CLINICAL DATA:  Fall 4 days ago with left-sided chest pain EXAM: LEFT RIBS AND CHEST - 3+ VIEW COMPARISON:  01/24/2014 FINDINGS: Cardiac shadow is within normal limits. The lungs are clear bilaterally. No pneumothorax or sizable effusion is noted. Minimal irregularity of the anterior aspect of the left sixth rib is noted which may represent an undisplaced fracture. No other fractures are noted. IMPRESSION: Irregularity of the anterior aspect of the left sixth rib which may represent an undisplaced fracture. Electronically Signed   By: Caleb Obrien M.D.   On: 04/06/2016 15:14    Assessment & Plan:   Caleb Obrien was seen today for hypertension.  Diagnoses and all orders for this visit:  Essential hypertension, benign- His blood pressure is adequately well controlled.  I will treat the vitamin D deficiency.  His renal function and electrolytes are normal. -     VITAMIN D 25 Hydroxy (Vit-D Deficiency, Fractures); Future -     Basic metabolic panel; Future -     Urinalysis, Routine w reflex microscopic; Future  High risk homosexual behavior- Screening for HIV and hep B are negative.  His renal function is normal.  He can continue taking Truvada. -     HIV antibody; Future -     Hepatitis B surface antigen; Future -     RPR; Future -     GC/Chlamydia Probe Amp; Future -      emtricitabine-tenofovir (TRUVADA) 200-300 MG tablet; Take 1 tablet by mouth daily.  Colon cancer screening -     Cologuard  Screening for malignant neoplasm of the rectum -     Cologuard  Vitamin D insufficiency -     Discontinue: Cholecalciferol 2000 units TABS; Take 1 tablet (2,000 Units total) by mouth daily. -     Cholecalciferol 2000 units TABS; Take 1 tablet (2,000 Units total) by mouth daily.   I have discontinued Jeneen Rinks C. Mcray "Chris"'s traZODone and emtricitabine-tenofovir. I have also changed his TRUVADA to emtricitabine-tenofovir. Additionally, I am having him maintain his ranitidine, cetirizine, tadalafil, aspirin EC, ALPRAZolam, buPROPion, irbesartan-hydrochlorothiazide, atorvastatin, and Cholecalciferol.  Meds ordered this encounter  Medications  . DISCONTD: Cholecalciferol 2000 units TABS    Sig: Take 1 tablet (2,000 Units total) by mouth daily.    Dispense:  90 tablet    Refill:  1  . Cholecalciferol 2000 units TABS    Sig: Take 1 tablet (2,000 Units total) by mouth daily.    Dispense:  90 tablet    Refill:  1  . emtricitabine-tenofovir (TRUVADA) 200-300 MG tablet    Sig: Take 1 tablet by mouth daily.    Dispense:  90 tablet    Refill:  1     Follow-up: Return in about 6 months (around 06/28/2018).  Scarlette Calico, MD

## 2017-12-27 ENCOUNTER — Encounter: Payer: Self-pay | Admitting: Internal Medicine

## 2017-12-27 DIAGNOSIS — E559 Vitamin D deficiency, unspecified: Secondary | ICD-10-CM | POA: Insufficient documentation

## 2017-12-27 LAB — RPR: RPR Ser Ql: NONREACTIVE

## 2017-12-27 LAB — HIV ANTIBODY (ROUTINE TESTING W REFLEX): HIV 1&2 Ab, 4th Generation: NONREACTIVE

## 2017-12-27 LAB — HEPATITIS B SURFACE ANTIGEN: Hepatitis B Surface Ag: NONREACTIVE

## 2017-12-27 LAB — VITAMIN D 25 HYDROXY (VIT D DEFICIENCY, FRACTURES): VITD: 37.04 ng/mL (ref 30.00–100.00)

## 2017-12-27 MED ORDER — CHOLECALCIFEROL 50 MCG (2000 UT) PO TABS: 1.0000 | ORAL_TABLET | Freq: Every day | ORAL | 1 refills | Status: DC

## 2017-12-27 MED ORDER — EMTRICITABINE-TENOFOVIR DF 200-300 MG PO TABS: 1.0000 | ORAL_TABLET | Freq: Every day | ORAL | 1 refills | Status: DC

## 2018-02-12 ENCOUNTER — Other Ambulatory Visit: Payer: Self-pay | Admitting: Internal Medicine

## 2018-02-12 DIAGNOSIS — E785 Hyperlipidemia, unspecified: Secondary | ICD-10-CM

## 2018-02-16 DIAGNOSIS — G4733 Obstructive sleep apnea (adult) (pediatric): Secondary | ICD-10-CM | POA: Diagnosis not present

## 2018-02-20 DIAGNOSIS — Z23 Encounter for immunization: Secondary | ICD-10-CM | POA: Diagnosis not present

## 2018-02-26 ENCOUNTER — Other Ambulatory Visit: Payer: Self-pay | Admitting: Internal Medicine

## 2018-02-26 ENCOUNTER — Encounter: Payer: Self-pay | Admitting: Internal Medicine

## 2018-02-26 DIAGNOSIS — I10 Essential (primary) hypertension: Secondary | ICD-10-CM

## 2018-02-26 MED ORDER — IRBESARTAN-HYDROCHLOROTHIAZIDE 300-12.5 MG PO TABS: 1.0000 | ORAL_TABLET | Freq: Every day | ORAL | 1 refills | Status: DC

## 2018-03-20 DIAGNOSIS — D509 Iron deficiency anemia, unspecified: Secondary | ICD-10-CM | POA: Diagnosis not present

## 2018-04-05 ENCOUNTER — Encounter: Payer: Self-pay | Admitting: Internal Medicine

## 2018-04-05 DIAGNOSIS — Z1211 Encounter for screening for malignant neoplasm of colon: Secondary | ICD-10-CM

## 2018-04-10 DIAGNOSIS — G4733 Obstructive sleep apnea (adult) (pediatric): Secondary | ICD-10-CM | POA: Diagnosis not present

## 2018-04-11 HISTORY — PX: OTHER SURGICAL HISTORY: SHX169

## 2018-04-13 ENCOUNTER — Encounter: Payer: Self-pay | Admitting: Gastroenterology

## 2018-04-13 ENCOUNTER — Ambulatory Visit (AMBULATORY_SURGERY_CENTER): Payer: Self-pay

## 2018-04-13 VITALS — Ht 68.0 in | Wt 163.6 lb

## 2018-04-13 DIAGNOSIS — Z1211 Encounter for screening for malignant neoplasm of colon: Secondary | ICD-10-CM

## 2018-04-13 MED ORDER — NA SULFATE-K SULFATE-MG SULF 17.5-3.13-1.6 GM/177ML PO SOLN: 1.0000 | Freq: Once | ORAL | 0 refills | Status: AC

## 2018-04-13 NOTE — Progress Notes (Signed)
Per pt, no allergies to soy or egg products.Pt not taking any weight loss meds or using  O2 at home.  Pt refused emmi video. 

## 2018-04-27 ENCOUNTER — Encounter: Payer: Self-pay | Admitting: Gastroenterology

## 2018-04-27 ENCOUNTER — Ambulatory Visit (AMBULATORY_SURGERY_CENTER): Payer: BLUE CROSS/BLUE SHIELD | Admitting: Gastroenterology

## 2018-04-27 VITALS — BP 117/77 | HR 85 | Temp 98.9°F | Resp 14 | Ht 68.0 in | Wt 163.0 lb

## 2018-04-27 DIAGNOSIS — Z1211 Encounter for screening for malignant neoplasm of colon: Secondary | ICD-10-CM | POA: Diagnosis not present

## 2018-04-27 DIAGNOSIS — D125 Benign neoplasm of sigmoid colon: Secondary | ICD-10-CM

## 2018-04-27 DIAGNOSIS — D123 Benign neoplasm of transverse colon: Secondary | ICD-10-CM

## 2018-04-27 LAB — HM COLONOSCOPY

## 2018-04-27 MED ORDER — SODIUM CHLORIDE 0.9 % IV SOLN: 500.0000 mL | Freq: Once | INTRAVENOUS | Status: DC

## 2018-04-27 NOTE — Patient Instructions (Signed)
  YOU HAD AN ENDOSCOPIC PROCEDURE TODAY AT [facility]:   Refer to the procedure report that was given to you for any specific questions about what was found during the examination.  If the procedure report does not answer your questions, please call your gastroenterologist to clarify.  If you requested that your care partner not be given the details of your procedure findings, then the procedure report has been included in a sealed envelope for you to review at your convenience later.  YOU SHOULD EXPECT: Some feelings of bloating in the abdomen. Passage of more gas than usual.  Walking can help get rid of the air that was put into your GI tract during the procedure and reduce the bloating. If you had a lower endoscopy (such as a colonoscopy or flexible sigmoidoscopy) you may notice spotting of blood in your stool or on the toilet paper. If you underwent a bowel prep for your procedure, you may not have a normal bowel movement for a few days.  Please Note:  You might notice some irritation and congestion in your nose or some drainage.  This is from the oxygen used during your procedure.  There is no need for concern and it should clear up in a day or so.  SYMPTOMS TO REPORT IMMEDIATELY:   Following lower endoscopy (colonoscopy or flexible sigmoidoscopy):  Excessive amounts of blood in the stool  Significant tenderness or worsening of abdominal pains  Swelling of the abdomen that is new, acute  Fever of 100F or higher  F  For urgent or emergent issues, a gastroenterologist can be reached at any hour by calling [phone].   DIET:  We do recommend a small meal at first, but then you may proceed to your regular diet.  Drink plenty of fluids but you should avoid alcoholic beverages for 24 hours.  ACTIVITY:  You should plan to take it easy for the rest of today and you should NOT DRIVE or use heavy machinery until tomorrow (because of the sedation medicines used during the  test).    FOLLOW UP: Our staff will call the number listed on your records the next business day following your procedure to check on you and address any questions or concerns that you may have regarding the information given to you following your procedure. If we do not reach you, we will leave a message.  However, if you are feeling well and you are not experiencing any problems, there is no need to return our call.  We will assume that you have returned to your regular daily activities without incident.  If any biopsies were taken you will be contacted by phone or by letter within the next 1-3 weeks.  Please call us at [phone] if you have not heard about the biopsies in 3 weeks.    SIGNATURES/CONFIDENTIALITY: You and/or your care partner have signed paperwork which will be entered into your electronic medical record.  These signatures attest to the fact that that the information above on your After Visit Summary has been reviewed and is understood.  Full responsibility of the confidentiality of this discharge information lies with you and/or your care-partner.

## 2018-04-27 NOTE — Op Note (Signed)
[place] Patient Name: Caleb Obrien Procedure Date: 04/27/2018 9:41 AM MRN: [id number] Endoscopist: Remo Lipps P. Havery Moros , MD Age: 52 Referring MD:  Date of Birth: 07-05-1965 Gender: Male Account #: [id number] Procedure:                Colonoscopy Indications:              Screening for colorectal malignant neoplasm, This                            is the patient's first colonoscopy Medicines:                Monitored Anesthesia Care Procedure:                Pre-Anesthesia Assessment:                           - Prior to the procedure, a History and Physical                            was performed, and patient medications and                            allergies were reviewed. The patient's tolerance of                            previous anesthesia was also reviewed. The risks                            and benefits of the procedure and the sedation                            options and risks were discussed with the patient.                            All questions were answered, and informed consent                            was obtained. Prior Anticoagulants: The patient has                            taken no previous anticoagulant or antiplatelet                            agents. ASA Grade Assessment: II - A patient with                            mild systemic disease. After reviewing the risks                            and benefits, the patient was deemed in                            satisfactory condition to undergo the procedure.  After obtaining informed consent, the colonoscope                            was passed under direct vision. Throughout the                            procedure, the patient's blood pressure, pulse, and                            oxygen saturations were monitored continuously. The                            Colonoscope was introduced through the anus and                            advanced to the the  cecum, identified by                            appendiceal orifice and ileocecal valve. The                            colonoscopy was performed without difficulty. The                            patient tolerated the procedure well. The quality                            of the bowel preparation was good. The ileocecal                            valve, appendiceal orifice, and rectum were                            photographed. Scope In: 9:42:44 AM Scope Out: 10:00:35 AM Scope Withdrawal Time: 0 hours 13 minutes 16 seconds  Total Procedure Duration: 0 hours 17 minutes 51 seconds  Findings:                 The perianal and digital rectal examinations were                            normal.                           A 4 mm polyp was found in the transverse colon. The                            polyp was sessile. The polyp was removed with a                            cold snare. Resection and retrieval were complete.                           A 3 mm polyp was found in the sigmoid colon. The  polyp was sessile. The polyp was removed with a                            cold snare. Resection and retrieval were complete.                           Scattered medium-mouthed diverticula were found in                            the sigmoid colon, transverse colon and ascending                            colon.                           Internal hemorrhoids were found during retroflexion.                           The exam was otherwise without abnormality. The                            right colon was tortous and the cecum was quite                            angulated. Complications:            No immediate complications. Estimated blood loss:                            Minimal. Estimated Blood Loss:     Estimated blood loss was minimal. Impression:               - One 4 mm polyp in the transverse colon, removed                            with a cold snare. Resected  and retrieved.                           - One 3 mm polyp in the sigmoid colon, removed with                            a cold snare. Resected and retrieved.                           - Diverticulosis in the sigmoid colon, in the                            transverse colon and in the ascending colon.                           - Internal hemorrhoids.                           - The examination was otherwise normal. Recommendation:           - Patient has a contact number  available for                            emergencies. The signs and symptoms of potential                            delayed complications were discussed with the                            patient. Return to normal activities tomorrow.                            Written discharge instructions were provided to the                            patient.                           - Resume previous diet.                           - Continue present medications.                           - Await pathology results. Remo Lipps P. Armbruster, MD 04/27/2018 10:04:31 AM This report has been signed electronically.

## 2018-04-27 NOTE — Progress Notes (Signed)
Pt's states no medical or surgical changes since previsit or office visit. 

## 2018-04-27 NOTE — Progress Notes (Signed)
Called to room to assist during endoscopic procedure.  Patient ID and intended procedure confirmed with present staff. Received instructions for my participation in the procedure from the performing physician.  

## 2018-04-27 NOTE — Progress Notes (Signed)
PT taken to PACU. Monitors in place. VSS. Report given to RN. 

## 2018-04-30 ENCOUNTER — Telehealth: Payer: Self-pay

## 2018-04-30 NOTE — Telephone Encounter (Signed)
  Follow up Call-  Call back number 04/27/2018  Post procedure Call Back phone  # [phone]  Permission to leave phone message Yes  Some recent data might be hidden     Patient questions:  Do you have a fever, pain , or abdominal swelling? No. Pain Score  0 *  Have you tolerated food without any problems? Yes.    Have you been able to return to your normal activities? Yes.    Do you have any questions about your discharge instructions: Diet   No. Medications  No. Follow up visit  No.  Do you have questions or concerns about your Care? No.  Actions: * If pain score is 4 or above: No action needed, pain <4.

## 2018-05-08 ENCOUNTER — Encounter: Payer: Self-pay | Admitting: *Deleted

## 2018-05-09 ENCOUNTER — Encounter: Payer: BLUE CROSS/BLUE SHIELD | Admitting: Internal Medicine

## 2018-05-15 ENCOUNTER — Other Ambulatory Visit: Payer: Self-pay | Admitting: Internal Medicine

## 2018-05-15 DIAGNOSIS — E785 Hyperlipidemia, unspecified: Secondary | ICD-10-CM

## 2018-05-23 ENCOUNTER — Ambulatory Visit (INDEPENDENT_AMBULATORY_CARE_PROVIDER_SITE_OTHER): Payer: BLUE CROSS/BLUE SHIELD | Admitting: Internal Medicine

## 2018-05-23 ENCOUNTER — Other Ambulatory Visit (INDEPENDENT_AMBULATORY_CARE_PROVIDER_SITE_OTHER): Payer: BLUE CROSS/BLUE SHIELD

## 2018-05-23 ENCOUNTER — Encounter: Payer: Self-pay | Admitting: Internal Medicine

## 2018-05-23 VITALS — BP 120/80 | HR 84 | Temp 98.8°F | Resp 16 | Ht 68.0 in | Wt 160.2 lb

## 2018-05-23 DIAGNOSIS — N522 Drug-induced erectile dysfunction: Secondary | ICD-10-CM

## 2018-05-23 DIAGNOSIS — G63 Polyneuropathy in diseases classified elsewhere: Secondary | ICD-10-CM

## 2018-05-23 DIAGNOSIS — E559 Vitamin D deficiency, unspecified: Secondary | ICD-10-CM | POA: Diagnosis not present

## 2018-05-23 DIAGNOSIS — E538 Deficiency of other specified B group vitamins: Secondary | ICD-10-CM | POA: Diagnosis not present

## 2018-05-23 DIAGNOSIS — E785 Hyperlipidemia, unspecified: Secondary | ICD-10-CM

## 2018-05-23 DIAGNOSIS — I10 Essential (primary) hypertension: Secondary | ICD-10-CM | POA: Diagnosis not present

## 2018-05-23 DIAGNOSIS — Z7252 High risk homosexual behavior: Secondary | ICD-10-CM

## 2018-05-23 DIAGNOSIS — Z0000 Encounter for general adult medical examination without abnormal findings: Secondary | ICD-10-CM

## 2018-05-23 DIAGNOSIS — Z23 Encounter for immunization: Secondary | ICD-10-CM

## 2018-05-23 LAB — VITAMIN B12: Vitamin B-12: 416 pg/mL (ref 211–911)

## 2018-05-23 LAB — COMPREHENSIVE METABOLIC PANEL
ALT: 32 U/L (ref 0–53)
AST: 27 U/L (ref 0–37)
Albumin: 4.9 g/dL (ref 3.5–5.2)
Alkaline Phosphatase: 86 U/L (ref 39–117)
BUN: 22 mg/dL (ref 6–23)
CO2: 29 mEq/L (ref 19–32)
Calcium: 9.8 mg/dL (ref 8.4–10.5)
Chloride: 101 mEq/L (ref 96–112)
Creatinine, Ser: 1.08 mg/dL (ref 0.40–1.50)
GFR: 76.7 mL/min (ref >60.00)
Glucose, Bld: 92 mg/dL (ref 70–99)
Potassium: 4.3 mEq/L (ref 3.5–5.1)
Sodium: 139 mEq/L (ref 135–145)
Total Bilirubin: 0.7 mg/dL (ref 0.2–1.2)
Total Protein: 7.8 g/dL (ref 6.0–8.3)

## 2018-05-23 LAB — LIPID PANEL
Cholesterol: 154 mg/dL (ref 0–200)
HDL: 53.9 mg/dL (ref >39.00)
LDL Cholesterol: 86 mg/dL (ref 0–99)
NonHDL: 100.38
Total CHOL/HDL Ratio: 3
Triglycerides: 74 mg/dL (ref 0.0–149.0)
VLDL: 14.8 mg/dL (ref 0.0–40.0)

## 2018-05-23 LAB — CBC WITH DIFFERENTIAL/PLATELET
Basophils Absolute: 0 10*3/uL (ref 0.0–0.1)
Basophils Relative: 0.6 % (ref 0.0–3.0)
Eosinophils Absolute: 0.1 10*3/uL (ref 0.0–0.7)
Eosinophils Relative: 1.4 % (ref 0.0–5.0)
HCT: 45.3 % (ref 39.0–52.0)
Hemoglobin: 15.6 g/dL (ref 13.0–17.0)
Lymphocytes Relative: 27 % (ref 12.0–46.0)
Lymphs Abs: 1.5 10*3/uL (ref 0.7–4.0)
MCHC: 34.5 g/dL (ref 30.0–36.0)
MCV: 103.4 fl — ABNORMAL HIGH (ref 78.0–100.0)
Monocytes Absolute: 1 10*3/uL (ref 0.1–1.0)
Monocytes Relative: 17.8 % — ABNORMAL HIGH (ref 3.0–12.0)
Neutro Abs: 3 10*3/uL (ref 1.4–7.7)
Neutrophils Relative %: 53.2 % (ref 43.0–77.0)
Platelets: 278 10*3/uL (ref 150.0–400.0)
RBC: 4.38 Mil/uL (ref 4.22–5.81)
RDW: 12.3 % (ref 11.5–15.5)
WBC: 5.7 10*3/uL (ref 4.0–10.5)

## 2018-05-23 LAB — URINALYSIS, ROUTINE W REFLEX MICROSCOPIC
Hgb urine dipstick: NEGATIVE
Ketones, ur: NEGATIVE
Leukocytes, UA: NEGATIVE
Nitrite: NEGATIVE
RBC / HPF: NONE SEEN (ref 0–?)
Specific Gravity, Urine: 1.015 (ref 1.000–1.030)
Total Protein, Urine: NEGATIVE
Urine Glucose: NEGATIVE
Urobilinogen, UA: 0.2 (ref 0.0–1.0)
pH: 7 (ref 5.0–8.0)

## 2018-05-23 LAB — FOLATE: Folate: 17 ng/mL (ref >5.9)

## 2018-05-23 LAB — TSH: TSH: 3.85 u[IU]/mL (ref 0.35–4.50)

## 2018-05-23 LAB — VITAMIN D 25 HYDROXY (VIT D DEFICIENCY, FRACTURES): VITD: 67.18 ng/mL (ref 30.00–100.00)

## 2018-05-23 MED ORDER — TADALAFIL 5 MG PO TABS: 5.0000 mg | ORAL_TABLET | Freq: Every day | ORAL | 5 refills | Status: DC | PRN

## 2018-05-23 MED ORDER — ZOSTER VAC RECOMB ADJUVANTED 50 MCG/0.5ML IM SUSR: 0.5000 mL | Freq: Once | INTRAMUSCULAR | 1 refills | Status: AC

## 2018-05-23 NOTE — Progress Notes (Signed)
Subjective:  Patient ID: Caleb Obrien, male    DOB: 1966/01/04  Age: 52 y.o. MRN: [id number]  CC: Annual Exam and Hypertension   HPI Caleb Obrien presents for a CPX.  He tells me his blood pressure has been well controlled.  He is very active and denies any recent episodes of CP, DOE, fatigue, or diaphoresis.  He continues to take Truvada for PreP.  He wants to stop taking the statin and then recheck his lipids in a few months and see if he still needs to take 1.  Outpatient Medications Prior to Visit  Medication Sig Dispense Refill  . ALPRAZolam (XANAX) 0.25 MG tablet TAKE 1/2 TABLET BY MOUTH AS NEEDED 30 tablet 3  . aspirin EC 81 MG tablet Take 1 tablet (81 mg total) by mouth daily. 90 tablet 3  . buPROPion (WELLBUTRIN SR) 100 MG 12 hr tablet Take 1 tablet (100 mg total) by mouth 2 (two) times daily. 180 tablet 3  . cetirizine (ZYRTEC) 10 MG tablet TAKE 1 TABLET(10 MG) BY MOUTH DAILY 30 tablet 11  . Cholecalciferol 2000 units TABS Take 1 tablet (2,000 Units total) by mouth daily. 90 tablet 1  . emtricitabine-tenofovir (TRUVADA) 200-300 MG tablet Take 1 tablet by mouth daily. 90 tablet 1  . famotidine (PEPCID) 20 MG tablet Take 20 mg by mouth daily.    . irbesartan-hydrochlorothiazide (AVALIDE) 300-12.5 MG tablet Take 1 tablet by mouth daily. 90 tablet 1  . atorvastatin (LIPITOR) 40 MG tablet TAKE ONE TABLET BY MOUTH DAILY 90 tablet 0  . tadalafil (CIALIS) 5 MG tablet Take 1 tablet (5 mg total) by mouth daily as needed for erectile dysfunction. 10 tablet 5  . irbesartan (AVAPRO) 300 MG tablet Take 300 mg by mouth daily.    . ranitidine (ZANTAC) 75 MG tablet Take 2 tablets (150 mg total) by mouth at bedtime. (Patient not taking: Reported on 04/13/2018) 60 tablet 1  . 0.9 %  sodium chloride infusion      No facility-administered medications prior to visit.     ROS Review of Systems  Constitutional: Negative.  Negative for appetite change, diaphoresis, fatigue and fever.    HENT: Negative.  Negative for sore throat and trouble swallowing.   Eyes: Negative for visual disturbance.  Respiratory: Negative for cough, chest tightness, shortness of breath and wheezing.   Cardiovascular: Negative for chest pain, palpitations and leg swelling.  Gastrointestinal: Negative for abdominal pain, constipation, diarrhea, nausea and vomiting.  Endocrine: Negative.   Genitourinary: Negative.  Negative for difficulty urinating, discharge, dysuria, genital sores, penile swelling, scrotal swelling, testicular pain and urgency.       +ED  Musculoskeletal: Negative for arthralgias, back pain and myalgias.  Skin: Negative.  Negative for color change, pallor and rash.  Neurological: Negative.  Negative for dizziness, weakness and light-headedness.  Hematological: Negative for adenopathy. Does not bruise/bleed easily.  Psychiatric/Behavioral: Negative.     Objective:  BP 120/80 (BP Location: Left Arm, Patient Position: Sitting, Cuff Size: Normal)   Pulse 84   Temp 98.8 F (37.1 C) (Oral)   Resp 16   Ht 5\' 8"  (1.727 m)   Wt 160 lb 4 oz (72.7 kg)   SpO2 98%   BMI 24.37 kg/m   BP Readings from Last 3 Encounters:  05/23/18 120/80  04/27/18 117/77  12/26/17 134/84    Wt Readings from Last 3 Encounters:  05/23/18 160 lb 4 oz (72.7 kg)  04/27/18 163 lb (73.9 kg)  04/13/18 163  lb 9.6 oz (74.2 kg)    Physical Exam Vitals signs reviewed.  Constitutional:      Appearance: He is normal weight. He is not ill-appearing.  HENT:     Nose: Nose normal. No congestion or rhinorrhea.     Mouth/Throat:     Pharynx: No oropharyngeal exudate or posterior oropharyngeal erythema.  Eyes:     Conjunctiva/sclera: Conjunctivae normal.  Neck:     Musculoskeletal: Normal range of motion and neck supple.  Cardiovascular:     Rate and Rhythm: Normal rate and regular rhythm.     Pulses: Normal pulses.     Heart sounds: No murmur. No gallop.   Pulmonary:     Effort: Pulmonary effort is  normal.     Breath sounds: Normal breath sounds. No stridor. No wheezing, rhonchi or rales.  Abdominal:     General: Abdomen is flat. Bowel sounds are normal.     Palpations: Abdomen is soft. There is no hepatomegaly, splenomegaly or mass.     Tenderness: There is no abdominal tenderness.     Hernia: There is no hernia in the right inguinal area or left inguinal area.  Genitourinary:    Pubic Area: No rash.      Penis: Normal and circumcised. No discharge, swelling or lesions.      Scrotum/Testes: Normal.        Right: Mass, tenderness or swelling not present.        Left: Mass, tenderness or swelling not present.     Epididymis:     Right: Normal.     Left: Normal.     Prostate: Normal. Not enlarged, not tender and no nodules present.     Rectum: Normal. Guaiac result negative. No mass, tenderness, anal fissure, external hemorrhoid or internal hemorrhoid. Normal anal tone.  Musculoskeletal: Normal range of motion.        General: No swelling, tenderness or signs of injury.  Lymphadenopathy:     Cervical: No cervical adenopathy.     Lower Body: No right inguinal adenopathy. No left inguinal adenopathy.  Skin:    General: Skin is warm and dry.     Coloration: Skin is not jaundiced or pale.     Findings: No rash.  Neurological:     General: No focal deficit present.     Mental Status: He is oriented to person, place, and time.  Psychiatric:        Mood and Affect: Mood normal.        Behavior: Behavior normal.     Lab Results  Component Value Date   WBC 5.7 05/23/2018   HGB 15.6 05/23/2018   HCT 45.3 05/23/2018   PLT 278.0 05/23/2018   GLUCOSE 92 05/23/2018   CHOL 154 05/23/2018   TRIG 74.0 05/23/2018   HDL 53.90 05/23/2018   LDLDIRECT 144.4 01/27/2012   LDLCALC 86 05/23/2018   ALT 32 05/23/2018   AST 27 05/23/2018   NA 139 05/23/2018   K 4.3 05/23/2018   CL 101 05/23/2018   CREATININE 1.08 05/23/2018   BUN 22 05/23/2018   CO2 29 05/23/2018   TSH 3.85 05/23/2018     PSA 0.82 05/18/2017    Dg Ribs Unilateral W/chest Left  Result Date: 04/06/2016 CLINICAL DATA:  Fall 4 days ago with left-sided chest pain EXAM: LEFT RIBS AND CHEST - 3+ VIEW COMPARISON:  01/24/2014 FINDINGS: Cardiac shadow is within normal limits. The lungs are clear bilaterally. No pneumothorax or sizable effusion is noted. Minimal irregularity  of the anterior aspect of the left sixth rib is noted which may represent an undisplaced fracture. No other fractures are noted. IMPRESSION: Irregularity of the anterior aspect of the left sixth rib which may represent an undisplaced fracture. Electronically Signed   By: Inez Catalina M.D.   On: 04/06/2016 15:14    Assessment & Plan:   Garl was seen today for annual exam and hypertension.  Diagnoses and all orders for this visit:  Essential hypertension, benign- His blood pressure is well controlled.  Electrolytes and renal function are normal. -     CBC with Differential/Platelet; Future -     TSH; Future -     Urinalysis, Routine w reflex microscopic; Future -     Comprehensive metabolic panel; Future  High risk homosexual behavior- Screening for HIV and hep B are negative.  His renal function is normal.  He can continue taking PreP. -     RPR; Future -     Hepatitis B surface antigen; Future  Routine general medical examination at a health care facility- Exam completed, labs reviewed, vaccines reviewed and updated, screening for colon cancer is up-to-date, patient education material was given. -     Lipid panel; Future -     HIV Antibody (routine testing w rflx); Future  Vitamin D insufficiency- His vitamin D level is normal now. -     VITAMIN D 25 Hydroxy (Vit-D Deficiency, Fractures); Future  Vitamin B12 deficiency neuropathy ([place])- His B12 level is normal now. -     Vitamin B12; Future -     Folate; Future  Drug-induced erectile dysfunction -     Discontinue: tadalafil (CIALIS) 5 MG tablet; Take 1 tablet (5 mg total) by mouth  daily as needed for erectile dysfunction. -     tadalafil (CIALIS) 5 MG tablet; Take 1 tablet (5 mg total) by mouth daily as needed for erectile dysfunction.  Need for shingles vaccine -     Zoster Vaccine Adjuvanted [facility]) injection; Inject 0.5 mLs into the muscle once for 1 dose.  Hyperlipidemia with target LDL less than 130- Will discontinue the statin.  He agrees to follow-up in 3 months to have his LDL rechecked at which time we will reconsider whether or not he needs to take a statin for CV risk reduction.   I have discontinued Caleb Rinks C. [place] "Chris"'s ranitidine, irbesartan, and atorvastatin. I am also having him start on Zoster Vaccine Adjuvanted. Additionally, I am having him maintain his cetirizine, aspirin EC, ALPRAZolam, buPROPion, Cholecalciferol, emtricitabine-tenofovir, irbesartan-hydrochlorothiazide, famotidine, and tadalafil. We will stop administering sodium chloride.  Meds ordered this encounter  Medications  . DISCONTD: tadalafil (CIALIS) 5 MG tablet    Sig: Take 1 tablet (5 mg total) by mouth daily as needed for erectile dysfunction.    Dispense:  10 tablet    Refill:  5  . tadalafil (CIALIS) 5 MG tablet    Sig: Take 1 tablet (5 mg total) by mouth daily as needed for erectile dysfunction.    Dispense:  10 tablet    Refill:  5  . Zoster Vaccine Adjuvanted [facility]) injection    Sig: Inject 0.5 mLs into the muscle once for 1 dose.    Dispense:  0.5 mL    Refill:  1     Follow-up: Return in about 6 months (around 11/22/2018).  Scarlette Calico, MD

## 2018-05-23 NOTE — Patient Instructions (Signed)

## 2018-05-24 ENCOUNTER — Encounter: Payer: Self-pay | Admitting: Internal Medicine

## 2018-05-24 DIAGNOSIS — G4733 Obstructive sleep apnea (adult) (pediatric): Secondary | ICD-10-CM | POA: Diagnosis not present

## 2018-05-24 LAB — HIV ANTIBODY (ROUTINE TESTING W REFLEX): HIV 1&2 Ab, 4th Generation: NONREACTIVE

## 2018-05-24 LAB — HEPATITIS B SURFACE ANTIGEN: Hepatitis B Surface Ag: NONREACTIVE

## 2018-05-24 LAB — RPR: RPR Ser Ql: NONREACTIVE

## 2018-05-29 ENCOUNTER — Other Ambulatory Visit (INDEPENDENT_AMBULATORY_CARE_PROVIDER_SITE_OTHER): Payer: BLUE CROSS/BLUE SHIELD

## 2018-05-29 ENCOUNTER — Encounter: Payer: Self-pay | Admitting: Internal Medicine

## 2018-05-29 DIAGNOSIS — Z0000 Encounter for general adult medical examination without abnormal findings: Secondary | ICD-10-CM

## 2018-05-29 LAB — PSA: PSA: 0.78 ng/mL (ref 0.10–4.00)

## 2018-05-29 NOTE — Addendum Note (Signed)
Addended by: Karle Barr on: 05/29/2018 08:17 AM   Modules accepted: Orders

## 2018-05-29 NOTE — Addendum Note (Signed)
Addended by: Raliegh Ip on: 05/29/2018 12:40 PM   Modules accepted: Orders

## 2018-06-17 ENCOUNTER — Other Ambulatory Visit: Payer: Self-pay | Admitting: Internal Medicine

## 2018-06-17 DIAGNOSIS — F411 Generalized anxiety disorder: Secondary | ICD-10-CM

## 2018-07-10 DIAGNOSIS — G4733 Obstructive sleep apnea (adult) (pediatric): Secondary | ICD-10-CM | POA: Diagnosis not present

## 2018-08-14 ENCOUNTER — Other Ambulatory Visit: Payer: Self-pay | Admitting: Internal Medicine

## 2018-08-14 ENCOUNTER — Ambulatory Visit (INDEPENDENT_AMBULATORY_CARE_PROVIDER_SITE_OTHER): Payer: BLUE CROSS/BLUE SHIELD | Admitting: Neurology

## 2018-08-14 ENCOUNTER — Encounter: Payer: Self-pay | Admitting: Neurology

## 2018-08-14 VITALS — BP 129/91 | HR 81 | Ht 68.0 in | Wt 166.0 lb

## 2018-08-14 DIAGNOSIS — Z9989 Dependence on other enabling machines and devices: Secondary | ICD-10-CM

## 2018-08-14 DIAGNOSIS — G4733 Obstructive sleep apnea (adult) (pediatric): Secondary | ICD-10-CM

## 2018-08-14 NOTE — Progress Notes (Signed)
SLEEP MEDICINE CLINIC   Provider:  Larey Seat, M D  Referring Provider: Janith Lima, MD Primary Care Physician:  Janith Lima, MD  Chief Complaint  Patient presents with  . Follow-up    pt alone, rm 10. pt states that everything is going well. no issues or concerns. DME [facility]     Caleb Obrien is a 53 y.o. male and is here for a regular yearly CPAP follow-up.  The patient has been a compliant CPAP user his blood pressure is well controlled he is not diabetic and in the meantime since his last visit with me he has quit smoking in May 2019.  He also has undergone a blepharoplasty to correct ptosis.  He states that there are no difficulties with using CPAP and the results is very positive.  He uses the machine 30 out of 30 days with 75% compliance for 4 hours or more of consecutive use.  Average user time is 5 hours 29 minutes, he is using an AutoSet between 5 and 10 cmH2O with 3 cm EPR and the residual AHI is 0.5 which speaks for a good resolution of apnea.  The 95th percentile pressure is 7.8 and there are equal numbers of central and obstructive apneas within the residual count.  He has minimal air leakage at this pressure the amount is not varying very much from night to night. He uses nasal pillows.      HPI:  The patient was seen here as a referral  from Dr. Ronnald Ramp for a possible sleep study, I have the pleasure of meeting with Caleb Obrien today on 10 August 2017, this gentleman has been using CPAP compliantly and brought today a download is a 93% compliance for days and 24 of these days over 4 hours of consecutive use.  His average is 6 hours and 6 minutes of nightly use, he is using an AutoSet with a pressure window between 5 and 10 cmH2O and 3 cm EPR, his AHI is 0.5 and his 95th percentile pressure is 7.9 cm.  He does not have significant air leaks.  The needs to be no adjustment made as his apnea is very well controlled at the patient reports an improvement  in daytime sleepiness and a noticeable difference on days he may not have to use CPAP the night before.  He has no doubt about the benefit.    Caleb Obrien underwent a sleep study attended in [place] about 3 years ago and afterwards was referred to for dental treatment with a mandibular advancement device. His claim was denied by his health insurance and resulted in a lot of costs to him. He never had the device made. He has used a dental device that he bought on the Internet, but he is not sure that it has helped. He is known to be a loud snorer and his snoring may not have been reduced at all as he describes, he also reports that he had choking and apnea attacks waking him out of sleep and these have continued while he used a new device. Snoring has been ongoing as has choking . He has not had a good night sleep, not refreshing nor of restoring quality. He is excessivly sleepy while driving, has tried a lot of caffeine through out the day and scheduled stops - with minimal success. His overnight sleep deteriorated further while using caffeine.  Sleep habits are as follows: Usually goes to bed by 11 PM and falls asleep within  30 minutes or less. Bedroom is cool, quiet and dark. He is going to sleep usually on his sides but wakes  on his back more often.  He suffers from GERD which also makes sleep sometimes less pleasant. He takes ranitidine over-the-counter in AM ! He props his head with 2 pillows, but he has continued to notice heavy snoring and wakes up from choking and apneic spells. He has allergic airway disease, but stopped antihistamines. His nose is frequently runny.   He does not have shortness of breath, palpitations, dependency edema, and has not experienced any fainting spells.  He does not have orthostasis, sometimes has a mild headache with elevated blood pressures, he does wake up clammy and diaphoretic however. Usually he has one nocturnal bathroom break and wakes with the urge of  urination. He usually does not wake up with headaches in the morning. He does not have a dry mouth in the morning, and she does not drink water during the night. Wakes up at 5.30 by alarm, read the news in bed than rises. He has not longer taken caffeine in AM.    I reviewed the patient's sleep study from [facility] sleep disorder center, performed on 06/04/2013 he had an AHI of 21.1 which places him in the moderate category. During sleep supine his AHI was 29 per hour of sleep, and in nonsupine 9.7 times per hour of sleep there was a strong positional component to his apnea. There are no central apneas noted, his heart rate stayed regular and in sinus rhythm throughout the night, oxygen nadir was 89%, typically not concerning and he had 3.9 PLM arousals during the diagnostic type and 2.8 per hour during the titration to CPAP which ended with a pressure of only 6 cm water pressure. He responded apparently very well to this rather low pressure and achieved a sleep time of 86 minutes under CPAP including 26.5 minutes of REM sleep. Sleep medical history and family sleep history: Mother has OSA and uses CPAP.  Caleb Obrien has a history of hypertension, hypercholesterolemia, depression with more anxiety than depression manifestation, he very rarely takes Xanax which was prescribed for anxiety attacks, he has Lipitor, Wellbutrin, Zyrtec as needed, Xopenex  Inhaler- as needed for wheezing, Zantac, Cialis, Ibusartin.   Social history:   Caffeine - not longer using,  Wine or liquor daily, ETOH  , 2 glasses,  he quit smoking just 10-2017  and used to smoke two cigs a day.  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 He is single,  one child ( son, 38 ).  I like for the DME to fit him to a nasal pillow or nasal mask, such as a pillairo,  I gave him a ballpark estimate for the CPAP cost as he has a high deductible health plan.  And he may hold off to January 2018 depending on the expected costs. I will also introduce into the tennis ball method to avoid supine sleep which accentuated his apnea significantly. I have further advised him to take ranitidine at night and not in the morning as it has sleepy making qualities.  Interval history from 08/09/2016, Caleb Obrien is here for his first follow-up after O2 titration. He has been using the machine 27 out of 30 days over 4 hours, achieving at 93% compliance which is excellent. Average daily use a time is 6 hours and  40 minutes the AutoSet is set between 5 and 10 cm water with 3 cm EPR and reduced his AH I, also known as apnea hypopnea index, to 2.6. There are no major air leaks, and nasal pillows have been comfortable, the patient had no problem to sleep using CPAP. His current Epworth Sleepiness Scale is endorsed at 5 points, fatigue severity scale is endorsed at 20 points.    Review of Systems: Out of a complete 14 system review, the patient complains of only the following symptoms, and all other reviewed systems are negative. He has recently had some scary experiences where he felt overwhelmed by the need to sleep while driving. He has been known to snore, he reports sleep choking, witnessed snoring, excessive daytime sleepiness, fatigue.  currently treated for anxiety and depression.   FSS at 2-29-2019 - 14 points, Epworth score is 2/24.    Social History   Socioeconomic History  . Marital status: Single    Spouse name: Not on file  . Number of children: Not on file  . Years of education: Not on file  . Highest education level: Not on file  Occupational History  . Occupation: HR    Employer: [employer]  Social Needs  . Financial resource strain: Not on file  . Food insecurity:    Worry: Not on file    Inability: Not on file  . Transportation needs:    Medical: Not on file    Non-medical: Not on file  Tobacco Use  . Smoking status:  Former Smoker    Packs/day: 0.30    Types: Cigarettes    Last attempt to quit: 10/2017    Years since quitting: 0.8  . Smokeless tobacco: Never Used  Substance and Sexual Activity  . Alcohol use: Yes    Alcohol/week: 7.0 standard drinks    Types: 7 Standard drinks or equivalent per week  . Drug use: No  . Sexual activity: Yes  Lifestyle  . Physical activity:    Days per week: Not on file    Minutes per session: Not on file  . Stress: Not on file  Relationships  . Social connections:    Talks on phone: Not on file    Gets together: Not on file    Attends religious service: Not on file    Active member of club or organization: Not on file    Attends meetings of clubs or organizations: Not on file    Relationship status: Not on file  . Intimate partner violence:    Fear of current or ex partner: Not on file    Emotionally abused: Not on file    Physically abused: Not on file    Forced sexual activity: Not on file  Other Topics Concern  . Not on file  Social History Narrative  . Not on file    Family History  Problem Relation Age of Onset  . Hypertension Mother   . Arthritis Mother   . Prostate cancer Father   . Healthy Sister   . Colon cancer Neg Hx   . Esophageal cancer Neg Hx   . Stomach cancer Neg Hx   . Rectal cancer Neg Hx     Past Medical History:  Diagnosis Date  . ALLERGIC RHINITIS CAUSE UNSPECIFIED 02/24/2009  . Allergy   . ASTHMA 02/05/2007  . BENIGN PROSTATIC HYPERTROPHY, WITH URINARY OBSTRUCTION 05/22/2007  . Dermatophytosis of foot 11/02/2009  . GANGLION CYST, WRIST, LEFT 03/25/2009  . Hemangioma of skin  and subcutaneous tissue 06/30/2009  . HEPATITIS B, HX OF 02/05/2007   per pt no history  . [place], [place] 04/21/2008  . PLANTAR WART, RIGHT 01/23/2009  . Sleep apnea    uses c-pap    Past Surgical History:  Procedure Laterality Date  . eyelid surgery  04/11/2018   [place]  . WISDOM TOOTH EXTRACTION      Current Outpatient  Medications  Medication Sig Dispense Refill  . ALPRAZolam (XANAX) 0.25 MG tablet TAKE 1/2 TABLET BY MOUTH AS NEEDED 30 tablet 3  . atorvastatin (LIPITOR) 40 MG tablet TAKE ONE TABLET BY MOUTH DAILY 90 tablet 0  . buPROPion (WELLBUTRIN SR) 100 MG 12 hr tablet TAKE ONE TABLET BY MOUTH TWICE A DAY 180 tablet 1  . cetirizine (ZYRTEC) 10 MG tablet TAKE 1 TABLET(10 MG) BY MOUTH DAILY 30 tablet 11  . Cholecalciferol 2000 units TABS Take 1 tablet (2,000 Units total) by mouth daily. 90 tablet 1  . famotidine (PEPCID) 20 MG tablet Take 20 mg by mouth daily.    . irbesartan-hydrochlorothiazide (AVALIDE) 300-12.5 MG tablet Take 1 tablet by mouth daily. 90 tablet 1  . tadalafil (CIALIS) 5 MG tablet Take 1 tablet (5 mg total) by mouth daily as needed for erectile dysfunction. 10 tablet 5   No current facility-administered medications for this visit.     Allergies as of 08/14/2018 - Review Complete 08/14/2018  Allergen Reaction Noted  . Amlodipine  02/07/2014  . Lisinopril  02/07/2014  . Sudafed [pseudoephedrine hcl] Other (See Comments) 11/26/2014    Vitals: BP (!) 129/91   Pulse 81   Ht 5\' 8"  (1.727 m)   Wt 166 lb (75.3 kg)   BMI 25.24 kg/m  Last Weight:  Wt Readings from Last 1 Encounters:  08/14/18 166 lb (75.3 kg)   JIR:CVEL mass index is 25.24 kg/m.     Last Height:   Ht Readings from Last 1 Encounters:  08/14/18 5\' 8"  (1.727 m)    Physical exam:  General: The patient is awake, alert and appears not in acute distress. The patient is well groomed. Full facial hair.  Head: Normocephalic, atraumatic.  Neck is supple. Mallampati  2 neck circumference:16.25 . Nasal airflow constricted , TMJ click evident . Retrognathia is  Not seen.  Cardiovascular:  Regular rate and rhythm, without  murmurs or carotid bruit, and without distended neck veins. Respiratory: Lungs are clear to auscultation. Skin:  Without evidence of edema, or rash Trunk: BMI is 26 . The patient's posture is erect    Neurologic exam : The patient is awake and alert, oriented to place and time.   Attention span & concentration ability appears normal.  Speech is fluent,  without  dysarthria, dysphonia or aphasia.  Mood and affect are appropriate.  Cranial nerves: Pupils are equal and briskly reactive to light. Extraocular movements  in vertical and horizontal planes intact and without nystagmus.  Facial motor strength is symmetric and tongue and uvula move midline. Shoulder shrug was symmetrical.    I spent more than  15 minutes of face to face time with the patient. Greater than 50% of time was spent in counseling and coordination of care. We have discussed the diagnosis and differential and I answered the patient's questions.      Dear Gershon Mussel,  Thank you again for sending this patient my way. I hope you continue to be happy with our services. Mr. Hunt has been a highly compliant CPAP patient who reports the benefits of therapy.  He is to ontinue CPAP auto titration 5-10 cm water 3 cm EPR, nasal pillows, and stays with [facility]. DME .  Rv in 12 month   Asencion Partridge Annemarie Sebree MD  08/14/2018   CC: Janith Lima, Md [address] [facility] [address] [place], [place] [address]

## 2018-08-16 DIAGNOSIS — G4733 Obstructive sleep apnea (adult) (pediatric): Secondary | ICD-10-CM | POA: Diagnosis not present

## 2018-10-28 ENCOUNTER — Encounter: Payer: Self-pay | Admitting: Internal Medicine

## 2018-10-31 DIAGNOSIS — Z03818 Encounter for observation for suspected exposure to other biological agents ruled out: Secondary | ICD-10-CM | POA: Diagnosis not present

## 2018-11-08 ENCOUNTER — Ambulatory Visit (INDEPENDENT_AMBULATORY_CARE_PROVIDER_SITE_OTHER): Payer: BLUE CROSS/BLUE SHIELD | Admitting: Internal Medicine

## 2018-11-08 ENCOUNTER — Other Ambulatory Visit: Payer: Self-pay

## 2018-11-08 ENCOUNTER — Encounter: Payer: Self-pay | Admitting: Internal Medicine

## 2018-11-08 VITALS — BP 142/90 | HR 82 | Temp 99.2°F | Resp 16 | Ht 68.0 in | Wt 166.5 lb

## 2018-11-08 DIAGNOSIS — Z23 Encounter for immunization: Secondary | ICD-10-CM

## 2018-11-08 DIAGNOSIS — B977 Papillomavirus as the cause of diseases classified elsewhere: Secondary | ICD-10-CM

## 2018-11-08 DIAGNOSIS — F418 Other specified anxiety disorders: Secondary | ICD-10-CM

## 2018-11-08 DIAGNOSIS — A630 Anogenital (venereal) warts: Secondary | ICD-10-CM

## 2018-11-08 MED ORDER — ALPRAZOLAM 0.25 MG PO TABS: ORAL_TABLET | ORAL | 3 refills | Status: DC

## 2018-11-08 NOTE — Patient Instructions (Signed)
Genital Warts  Genital warts are a common STD (sexually transmitted disease). They may appear as small bumps on the skin of the genital and anal areas. They sometimes become irritated and cause pain. Genital warts are easily passed to other people through sexual contact. Many people do not know that they are infected. They may be infected for years without symptoms. However, even if they do not have symptoms, they can pass the infection to their sexual partners.  Getting treatment is important because genital warts can lead to other problems. In females, the virus that causes genital warts may increase the risk for cervical cancer.  What are the causes?  This condition is caused by a virus that is called human papillomavirus (HPV). HPV is spread by having unprotected sex with an infected person. It can be spread through vaginal, anal, and oral sex.  What increases the risk?  You are more likely to develop this condition if:   You have unprotected sex.   You have multiple sexual partners.   You are sexually active before age 16.   You are a man who isnot circumcised.   You have a male sexual partner who is not circumcised.   You have a weakened body defense system (immune system) due to disease or medicine.   You smoke.  What are the signs or symptoms?  Symptoms of this condition include:   Small growths in the genital area or anal area. These warts often grow in clusters.   Itching and irritation in the genital area or anal area.   Bleeding from the warts.   Pain during sex.  How is this diagnosed?  This condition is diagnosed based on:   Your symptoms.   A physical exam.  You may also have other tests, including:   Biopsy. A tissue sample is removed so it can be checked under a microscope.   Colposcopy. In females, a magnifying tool is used to examine the vagina and cervix. Certain solutions may be used to make the HPV cells change color so they can be seen more easily.   A Pap test in  females.   Tests for other STDs.  How is this treated?  This condition may be treated with:   Medicines, such as:  ? Solutions or creams applied to your skin (topical).   Procedures, such as:  ? Freezing the warts with liquid nitrogen (cryotherapy).  ? Burning the warts with a laser or electric probe (electrocautery).  ? Surgery to remove the warts.  Follow these instructions at home:  Medicines     Apply over-the-counter and prescription medicines only as told by your health care provider.   Do not treat genital warts with medicines that are used for treating hand warts.   Talk with your health care provider about using over-the-counter anti-itch creams.  Instructions for women   Get screened regularly for cervical cancer. Women who have genital warts are at an increased risk for this cancer. This type of cancer is slow growing and can be cured if it is found early.   If you become pregnant, tell your health care provider that you have had an HPV infection. Your health care provider will monitor you closely during pregnancy to be sure that your baby is safe.  General instructions   Do not touch or scratch the warts.   Do not have sex until your treatment has been completed.   Tell your current and past sexual partners about your condition because   they may also need treatment.   After treatment, use condoms during sex to prevent future infections.   Keep all follow-up visits as told by your health care provider. This is important.  How is this prevented?  Talk with your health care provider about getting the HPV vaccine. The vaccine:   Can prevent some HPV infections and cancers.   Is recommended for males and females who are 11-26 years old.   Is not recommended for pregnant women.   Will not work if you already have HPV.  Contact a health care provider if you:   Have redness, swelling, or pain in the area of the treated skin.   Have a fever.   Feel generally ill.   Feel lumps in and around  your genital or anal area.   Have bleeding in your genital or anal area.   Have pain during sex.  Summary   Genital warts are a common STD (sexually transmitted disease). It may appear as small bumps on the genital and anal areas.   This condition is caused by a virus that is called human papillomavirus (HPV). HPV is spread by having unprotected sex with an infected person. It can be spread through vaginal, anal, and oral sex.   Treatment is important because genital warts can lead to other problems. In females, the virus that causes genital warts may increase the risk for cervical cancer.   This condition may be treated with medicine that is applied to the skin, or procedures to remove the warts.   The HPV vaccine can prevent some HPV infections and cancers. It is recommended that the vaccine be given to males and females who are 11-26 years old.  This information is not intended to replace advice given to you by your health care provider. Make sure you discuss any questions you have with your health care provider.  Document Released: 05/27/2000 Document Revised: 07/04/2017 Document Reviewed: 07/04/2017  Elsevier Interactive Patient Education  2019 [employer]

## 2018-11-08 NOTE — Progress Notes (Signed)
Subjective:  Patient ID: Caleb Obrien, male    DOB: 24-Apr-1966  Age: 53 y.o. MRN: [id number]  CC: Hypertension   HPI Caleb Obrien presents for f/up - He is concerned about a crusty lesion he has had on the left shaft of his penis for a month.  He has had no sexual encounters over the last 6 months.  He denies rash, lymphadenopathy, dysuria, or hematuria.  He complains pf persistent anxiety and needs a refill of xanax.  Outpatient Medications Prior to Visit  Medication Sig Dispense Refill  . atorvastatin (LIPITOR) 40 MG tablet TAKE ONE TABLET BY MOUTH DAILY 90 tablet 0  . buPROPion (WELLBUTRIN SR) 100 MG 12 hr tablet TAKE ONE TABLET BY MOUTH TWICE A DAY 180 tablet 1  . cetirizine (ZYRTEC) 10 MG tablet TAKE 1 TABLET(10 MG) BY MOUTH DAILY 30 tablet 11  . Cholecalciferol 2000 units TABS Take 1 tablet (2,000 Units total) by mouth daily. 90 tablet 1  . famotidine (PEPCID) 20 MG tablet Take 20 mg by mouth daily.    . irbesartan-hydrochlorothiazide (AVALIDE) 300-12.5 MG tablet Take 1 tablet by mouth daily. 90 tablet 1  . tadalafil (CIALIS) 5 MG tablet Take 1 tablet (5 mg total) by mouth daily as needed for erectile dysfunction. 10 tablet 5  . ALPRAZolam (XANAX) 0.25 MG tablet TAKE 1/2 TABLET BY MOUTH AS NEEDED 30 tablet 3   No facility-administered medications prior to visit.     ROS Review of Systems  Constitutional: Negative.  Negative for diaphoresis and fatigue.  HENT: Negative.   Eyes: Negative for visual disturbance.  Respiratory: Negative for chest tightness, shortness of breath and wheezing.   Cardiovascular: Negative for chest pain, palpitations and leg swelling.  Gastrointestinal: Negative for abdominal pain and diarrhea.  Genitourinary: Negative for difficulty urinating, discharge, dysuria, genital sores, penile swelling, scrotal swelling, testicular pain and urgency.  Musculoskeletal: Negative.  Negative for myalgias.  Skin: Negative for color change, pallor and  rash.  Neurological: Negative.  Negative for dizziness.  Hematological: Negative for adenopathy. Does not bruise/bleed easily.  Psychiatric/Behavioral: Negative for confusion, decreased concentration, dysphoric mood, sleep disturbance and suicidal ideas. The patient is nervous/anxious.     Objective:  BP (!) 142/90 (BP Location: Left Arm, Patient Position: Sitting, Cuff Size: Normal)   Pulse 82   Temp 99.2 F (37.3 C) (Oral)   Resp 16   Ht 5\' 8"  (1.727 m)   Wt 166 lb 8 oz (75.5 kg)   SpO2 98%   BMI 25.32 kg/m   BP Readings from Last 3 Encounters:  11/08/18 (!) 142/90  08/14/18 (!) 129/91  05/23/18 120/80    Wt Readings from Last 3 Encounters:  11/08/18 166 lb 8 oz (75.5 kg)  08/14/18 166 lb (75.3 kg)  05/23/18 160 lb 4 oz (72.7 kg)    Physical Exam Vitals signs reviewed.  Constitutional:      Appearance: He is not ill-appearing or diaphoretic.  HENT:     Nose: Nose normal. No congestion.     Mouth/Throat:     Mouth: Mucous membranes are moist.     Pharynx: No oropharyngeal exudate.  Eyes:     General: No scleral icterus.    Conjunctiva/sclera: Conjunctivae normal.  Neck:     Musculoskeletal: Normal range of motion. No neck rigidity.  Cardiovascular:     Rate and Rhythm: Normal rate.     Heart sounds: No murmur. No gallop.   Pulmonary:     Effort: Pulmonary effort  is normal.     Breath sounds: No stridor. No wheezing, rhonchi or rales.  Abdominal:     General: Abdomen is flat.     Palpations: There is no hepatomegaly, splenomegaly or mass.     Tenderness: There is no abdominal tenderness. There is no guarding.     Hernia: There is no hernia in the right inguinal area or left inguinal area.  Genitourinary:    Pubic Area: No rash.      Penis: Normal.      Scrotum/Testes: Normal.        Right: Mass or tenderness not present.        Left: Mass or tenderness not present.     Epididymis:     Right: Normal. Not inflamed or enlarged. No mass or tenderness.      Left: Normal. Not inflamed or enlarged. No mass or tenderness.    Musculoskeletal: Normal range of motion.     Right lower leg: No edema.     Left lower leg: No edema.  Lymphadenopathy:     Cervical: No cervical adenopathy.     Lower Body: No right inguinal adenopathy. No left inguinal adenopathy.  Skin:    General: Skin is warm and dry.  Neurological:     General: No focal deficit present.     Lab Results  Component Value Date   WBC 5.7 05/23/2018   HGB 15.6 05/23/2018   HCT 45.3 05/23/2018   PLT 278.0 05/23/2018   GLUCOSE 92 05/23/2018   CHOL 154 05/23/2018   TRIG 74.0 05/23/2018   HDL 53.90 05/23/2018   LDLDIRECT 144.4 01/27/2012   LDLCALC 86 05/23/2018   ALT 32 05/23/2018   AST 27 05/23/2018   NA 139 05/23/2018   K 4.3 05/23/2018   CL 101 05/23/2018   CREATININE 1.08 05/23/2018   BUN 22 05/23/2018   CO2 29 05/23/2018   TSH 3.85 05/23/2018   PSA 0.78 05/29/2018    Dg Ribs Unilateral W/chest Left  Result Date: 04/06/2016 CLINICAL DATA:  Fall 4 days ago with left-sided chest pain EXAM: LEFT RIBS AND CHEST - 3+ VIEW COMPARISON:  01/24/2014 FINDINGS: Cardiac shadow is within normal limits. The lungs are clear bilaterally. No pneumothorax or sizable effusion is noted. Minimal irregularity of the anterior aspect of the left sixth rib is noted which may represent an undisplaced fracture. No other fractures are noted. IMPRESSION: Irregularity of the anterior aspect of the left sixth rib which may represent an undisplaced fracture. Electronically Signed   By: Inez Catalina M.D.   On: 04/06/2016 15:14   Verbal informed consent.  Liquid nitrogen was applied for 10-12 seconds to the skin lesion of penile shaft.  3 cycles were completed.  He tolerated this well with no complications.  Assessment & Plan:   Caleb Obrien was seen today for hypertension.  Diagnoses and all orders for this visit:  Penile wart- Treated with cryotherapy, 1st dose of HPV vaccine given. Will recheck in 3-4  weeks.   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  Meds ordered this encounter  Medications  . ALPRAZolam (XANAX) 0.25 MG tablet    Sig: TAKE 1/2 TABLET BY MOUTH AS NEEDED    Dispense:  30 tablet    Refill:  3     Follow-up: Return in about 4 weeks (around 12/06/2018).  Scarlette Calico, MD

## 2018-11-21 DIAGNOSIS — G4733 Obstructive sleep apnea (adult) (pediatric): Secondary | ICD-10-CM | POA: Diagnosis not present

## 2018-11-29 ENCOUNTER — Encounter: Payer: Self-pay | Admitting: Internal Medicine

## 2018-11-29 ENCOUNTER — Other Ambulatory Visit: Payer: Self-pay | Admitting: Internal Medicine

## 2018-11-29 DIAGNOSIS — N489 Disorder of penis, unspecified: Secondary | ICD-10-CM | POA: Insufficient documentation

## 2018-11-30 ENCOUNTER — Other Ambulatory Visit: Payer: Self-pay | Admitting: Internal Medicine

## 2018-11-30 DIAGNOSIS — I10 Essential (primary) hypertension: Secondary | ICD-10-CM

## 2018-12-06 ENCOUNTER — Ambulatory Visit (INDEPENDENT_AMBULATORY_CARE_PROVIDER_SITE_OTHER): Payer: BC Managed Care – PPO

## 2018-12-06 ENCOUNTER — Other Ambulatory Visit: Payer: Self-pay

## 2018-12-06 DIAGNOSIS — Z299 Encounter for prophylactic measures, unspecified: Secondary | ICD-10-CM

## 2018-12-06 DIAGNOSIS — Z23 Encounter for immunization: Secondary | ICD-10-CM | POA: Diagnosis not present

## 2018-12-14 ENCOUNTER — Encounter: Payer: Self-pay | Admitting: Internal Medicine

## 2018-12-15 DIAGNOSIS — Z1159 Encounter for screening for other viral diseases: Secondary | ICD-10-CM | POA: Diagnosis not present

## 2018-12-17 ENCOUNTER — Telehealth: Payer: Self-pay

## 2018-12-17 NOTE — Telephone Encounter (Signed)
Spoke with patient.  He states he was tested on 12/15/2018 at an urgent care.

## 2018-12-17 NOTE — Telephone Encounter (Signed)
Per PCP - please call pt to schedule testing for COVID due to current symptoms.

## 2018-12-20 ENCOUNTER — Encounter: Payer: Self-pay | Admitting: Internal Medicine

## 2018-12-24 ENCOUNTER — Encounter: Payer: Self-pay | Admitting: Internal Medicine

## 2018-12-25 ENCOUNTER — Ambulatory Visit (INDEPENDENT_AMBULATORY_CARE_PROVIDER_SITE_OTHER): Payer: BC Managed Care – PPO | Admitting: Internal Medicine

## 2018-12-25 VITALS — BP 130/90 | HR 94

## 2018-12-25 DIAGNOSIS — I10 Essential (primary) hypertension: Secondary | ICD-10-CM | POA: Diagnosis not present

## 2018-12-25 MED ORDER — CARVEDILOL 3.125 MG PO TABS: 3.1250 mg | ORAL_TABLET | Freq: Two times a day (BID) | ORAL | 0 refills | Status: DC

## 2018-12-25 NOTE — Progress Notes (Signed)
Virtual Visit via Video Note  I connected with Caleb Obrien on 12/25/18 at  2:00 PM EDT by a video enabled telemedicine application and verified that I am speaking with the correct person using two identifiers.   I discussed the limitations of evaluation and management by telemedicine and the availability of in person appointments. The patient expressed understanding and agreed to proceed.  History of Present Illness: He checked in for a virtual visit.  He was not willing to come in for an in person visit due to the COVID-19 pandemic.  He complains of a several week history of elevated blood pressure, elevated heart rate, and chest tightness at rest.  He was recently seen at an urgent care center.  He was concerned about COVID-19 and says his COVID-19 test was negative.  He has checked his blood pressure and pulse a few times over the last few days and says his blood pressure was 141/95 with a pulse of 107 and 131/90 with a pulse of 94.  He denies DOE, palpitations, headache, blurred vision, edema, or fatigue.  He tells me he is compliant with the ARB and diuretic.    Observations/Objective: Anxious male in no acute distress.  Skin color and turgor were normal.  He was otherwise cooperative and appropriate.  Operations were normal.  Lab Results  Component Value Date   WBC 5.7 05/23/2018   HGB 15.6 05/23/2018   HCT 45.3 05/23/2018   PLT 278.0 05/23/2018   GLUCOSE 92 05/23/2018   CHOL 154 05/23/2018   TRIG 74.0 05/23/2018   HDL 53.90 05/23/2018   LDLDIRECT 144.4 01/27/2012   LDLCALC 86 05/23/2018   ALT 32 05/23/2018   AST 27 05/23/2018   NA 139 05/23/2018   K 4.3 05/23/2018   CL 101 05/23/2018   CREATININE 1.08 05/23/2018   BUN 22 05/23/2018   CO2 29 05/23/2018   TSH 3.85 05/23/2018   PSA 0.78 05/29/2018     Assessment and Plan: His blood pressure is not adequately well controlled and he has mild tachycardia.  There is likely an anxiety component with the tachycardia.   Nonetheless, I have asked him to add carvedilol to the ARB and diuretic.  I have asked him to try to quit smoking.  I have asked him to continue working on his lifestyle modifications.   Follow Up Instructions: He agrees to the above recommendations.  He will let me know if he develops any new or worsening symptoms.  He agrees to come in in 1 to 2 months for me to recheck his blood pressure and pulse in person.    I discussed the assessment and treatment plan with the patient. The patient was provided an opportunity to ask questions and all were answered. The patient agreed with the plan and demonstrated an understanding of the instructions.   The patient was advised to call back or seek an in-person evaluation if the symptoms worsen or if the condition fails to improve as anticipated.  I provided 25 minutes of non-face-to-face time during this encounter.   Scarlette Calico, MD

## 2018-12-27 ENCOUNTER — Other Ambulatory Visit: Payer: Self-pay | Admitting: Internal Medicine

## 2018-12-27 DIAGNOSIS — F411 Generalized anxiety disorder: Secondary | ICD-10-CM

## 2018-12-29 ENCOUNTER — Encounter: Payer: Self-pay | Admitting: Internal Medicine

## 2019-01-22 ENCOUNTER — Encounter: Payer: Self-pay | Admitting: Internal Medicine

## 2019-01-24 ENCOUNTER — Other Ambulatory Visit: Payer: Self-pay | Admitting: Internal Medicine

## 2019-01-24 DIAGNOSIS — K649 Unspecified hemorrhoids: Secondary | ICD-10-CM | POA: Insufficient documentation

## 2019-01-24 MED ORDER — HYDROCORT-PRAMOXINE (PERIANAL) 1-1 % EX FOAM: 1.0000 | Freq: Three times a day (TID) | CUTANEOUS | 1 refills | Status: DC

## 2019-03-12 DIAGNOSIS — Z23 Encounter for immunization: Secondary | ICD-10-CM | POA: Diagnosis not present

## 2019-03-21 ENCOUNTER — Other Ambulatory Visit: Payer: Self-pay | Admitting: Internal Medicine

## 2019-03-21 DIAGNOSIS — I10 Essential (primary) hypertension: Secondary | ICD-10-CM

## 2019-03-25 ENCOUNTER — Encounter: Payer: Self-pay | Admitting: Internal Medicine

## 2019-03-31 ENCOUNTER — Other Ambulatory Visit: Payer: Self-pay | Admitting: Internal Medicine

## 2019-03-31 DIAGNOSIS — F411 Generalized anxiety disorder: Secondary | ICD-10-CM

## 2019-04-01 DIAGNOSIS — G4733 Obstructive sleep apnea (adult) (pediatric): Secondary | ICD-10-CM | POA: Diagnosis not present

## 2019-04-16 ENCOUNTER — Encounter: Payer: Self-pay | Admitting: Internal Medicine

## 2019-05-16 ENCOUNTER — Encounter: Payer: Self-pay | Admitting: Internal Medicine

## 2019-05-16 ENCOUNTER — Other Ambulatory Visit: Payer: BC Managed Care – PPO

## 2019-05-16 ENCOUNTER — Ambulatory Visit (INDEPENDENT_AMBULATORY_CARE_PROVIDER_SITE_OTHER): Payer: BC Managed Care – PPO | Admitting: Internal Medicine

## 2019-05-16 ENCOUNTER — Other Ambulatory Visit: Payer: Self-pay

## 2019-05-16 VITALS — BP 126/84 | HR 87 | Temp 98.8°F | Ht 68.0 in | Wt 158.0 lb

## 2019-05-16 DIAGNOSIS — F411 Generalized anxiety disorder: Secondary | ICD-10-CM | POA: Diagnosis not present

## 2019-05-16 DIAGNOSIS — I10 Essential (primary) hypertension: Secondary | ICD-10-CM | POA: Diagnosis not present

## 2019-05-16 DIAGNOSIS — Z202 Contact with and (suspected) exposure to infections with a predominantly sexual mode of transmission: Secondary | ICD-10-CM

## 2019-05-16 MED ORDER — PENICILLIN G BENZATHINE 2400000 UNIT/4ML IM SUSP: INTRAMUSCULAR | 0 refills | Status: DC

## 2019-05-16 MED ORDER — AZITHROMYCIN 250 MG PO TABS: ORAL_TABLET | ORAL | 0 refills | Status: DC

## 2019-05-16 MED ORDER — DOXYCYCLINE HYCLATE 100 MG PO TABS: 100.0000 mg | ORAL_TABLET | Freq: Two times a day (BID) | ORAL | 0 refills | Status: DC

## 2019-05-16 NOTE — Patient Instructions (Addendum)
Please take all new medication as prescribed - the doxycycline and azithromycin pill antibiotics  Please take all new medication as prescribed - the penicillin G at 2.4 million units in the muscle x 1 dose  If you need the shot to be done in this office, bring the medication and the nurse can do this   Please continue all other medications as before, and refills have been done if requested.  Please have the pharmacy call with any other refills you may need.  Please keep your appointments with your specialists as you may have planned  Please go to the LAB in the Basement (turn left off the elevator) for the tests to be done today  You will be contacted by phone if any changes need to be made immediately.  Otherwise, you will receive a letter about your results with an explanation, but please check with MyChart first.  Please remember to sign up for MyChart if you have not done so, as this will be important to you in the future with finding out test results, communicating by private email, and scheduling acute appointments online when needed.

## 2019-05-16 NOTE — Progress Notes (Signed)
Subjective:    Patient ID: Caleb Obrien, male    DOB: 1965/08/15, 53 y.o.   MRN: [id number]  HPI  Here to f/u, highly concerned that he was exposed to a person with both syphilis and chlamydia, apparently testing positive and asks for eval and tx.  No fever, and Denies urinary symptoms such as dysuria, frequency, urgency, flank pain, hematuria or n/v, fever, chills.  Pt denies chest pain, increased sob or doe, wheezing, orthopnea, PND, increased LE swelling, palpitations, dizziness or syncope.   Pt denies polydipsia, polyuria. Pt is rather adamant for treatment now Past Medical History:  Diagnosis Date  . ALLERGIC RHINITIS CAUSE UNSPECIFIED 02/24/2009  . Allergy   . ASTHMA 02/05/2007  . BENIGN PROSTATIC HYPERTROPHY, WITH URINARY OBSTRUCTION 05/22/2007  . Dermatophytosis of foot 11/02/2009  . GANGLION CYST, WRIST, LEFT 03/25/2009  . Hemangioma of skin and subcutaneous tissue 06/30/2009  . HEPATITIS B, HX OF 02/05/2007   per pt no history  . [place] 04/21/2008  . PLANTAR WART, RIGHT 01/23/2009  . Sleep apnea    uses c-pap   Past Surgical History:  Procedure Laterality Date  . eyelid surgery  04/11/2018   [place]  . WISDOM TOOTH EXTRACTION      reports that he quit smoking about 19 months ago. His smoking use included cigarettes. He smoked 0.30 packs per day. He has never used smokeless tobacco. He reports current alcohol use of about 7.0 standard drinks of alcohol per week. He reports that he does not use drugs. family history includes Arthritis in his mother; Healthy in his sister; Hypertension in his mother; Prostate cancer in his father. Allergies  Allergen Reactions  . Amlodipine     cough  . Lisinopril     cough  . Sudafed [Pseudoephedrine Hcl] Other (See Comments)    prostatism   Current Outpatient Medications on File Prior to Visit  Medication Sig Dispense Refill  . ALPRAZolam (XANAX) 0.25 MG tablet TAKE 1/2 TABLET BY MOUTH AS NEEDED 30 tablet 3  .  atorvastatin (LIPITOR) 40 MG tablet TAKE ONE TABLET BY MOUTH DAILY 90 tablet 0  . buPROPion (WELLBUTRIN SR) 100 MG 12 hr tablet TAKE ONE TABLET BY MOUTH TWICE A DAY 180 tablet 0  . carvedilol (COREG) 3.125 MG tablet TAKE ONE TABLET BY MOUTH TWICE A DAY WITH A MEAL 180 tablet 0  . cetirizine (ZYRTEC) 10 MG tablet TAKE 1 TABLET(10 MG) BY MOUTH DAILY 30 tablet 11  . Cholecalciferol 2000 units TABS Take 1 tablet (2,000 Units total) by mouth daily. 90 tablet 1  . famotidine (PEPCID) 20 MG tablet Take 20 mg by mouth daily.    . hydrocortisone-pramoxine (PROCTOFOAM-HC) rectal foam Place 1 applicator rectally 3 (three) times daily. 10 g 1  . irbesartan-hydrochlorothiazide (AVALIDE) 300-12.5 MG tablet TAKE ONE TABLET BY MOUTH DAILY 90 tablet 1  . tadalafil (CIALIS) 5 MG tablet Take 1 tablet (5 mg total) by mouth daily as needed for erectile dysfunction. 10 tablet 5   No current facility-administered medications on file prior to visit.    Review of Systems  Constitutional: Negative for other unusual diaphoresis or sweats HENT: Negative for ear discharge or swelling Eyes: Negative for other worsening visual disturbances Respiratory: Negative for stridor or other swelling  Gastrointestinal: Negative for worsening distension or other blood Genitourinary: Negative for retention or other urinary change Musculoskeletal: Negative for other MSK pain or swelling Skin: Negative for color change or other new lesions Neurological: Negative for worsening tremors and  other numbness  Psychiatric/Behavioral: Negative for worsening agitation or other fatigue All otherwise neg per pt     Objective:   Physical Exam BP 126/84   Pulse 87   Temp 98.8 F (37.1 C) (Oral)   Ht 5\' 8"  (1.727 m)   Wt 158 lb (71.7 kg)   SpO2 97%   BMI 24.02 kg/m  VS noted,  Constitutional: Pt appears in NAD HENT: Head: NCAT.  Right Ear: External ear normal.  Left Ear: External ear normal.  Eyes: . Pupils are equal, round, and  reactive to light. Conjunctivae and EOM are normal Nose: without d/c or deformity Neck: Neck supple. Gross normal ROM Cardiovascular: Normal rate and regular rhythm.   Pulmonary/Chest: Effort normal and breath sounds without rales or wheezing.  Abd:  Soft, NT, ND, + BS, no organomegaly Neurological: Pt is alert. At baseline orientation, motor grossly intact Skin: Skin is warm. No rashes, other new lesions, no LE edema Psychiatric: Pt behavior is normal without agitation  All otherwise neg per pt Lab Results  Component Value Date   WBC 5.7 05/23/2018   HGB 15.6 05/23/2018   HCT 45.3 05/23/2018   PLT 278.0 05/23/2018   GLUCOSE 92 05/23/2018   CHOL 154 05/23/2018   TRIG 74.0 05/23/2018   HDL 53.90 05/23/2018   LDLDIRECT 144.4 01/27/2012   LDLCALC 86 05/23/2018   ALT 32 05/23/2018   AST 27 05/23/2018   NA 139 05/23/2018   K 4.3 05/23/2018   CL 101 05/23/2018   CREATININE 1.08 05/23/2018   BUN 22 05/23/2018   CO2 29 05/23/2018   TSH 3.85 05/23/2018   PSA 0.78 05/29/2018        Assessment & Plan:

## 2019-05-17 ENCOUNTER — Encounter: Payer: Self-pay | Admitting: Internal Medicine

## 2019-05-17 LAB — HIV ANTIBODY (ROUTINE TESTING W REFLEX): HIV 1&2 Ab, 4th Generation: NONREACTIVE

## 2019-05-17 LAB — RPR: RPR Ser Ql: NONREACTIVE

## 2019-05-18 ENCOUNTER — Encounter: Payer: Self-pay | Admitting: Internal Medicine

## 2019-05-18 DIAGNOSIS — Z202 Contact with and (suspected) exposure to infections with a predominantly sexual mode of transmission: Secondary | ICD-10-CM | POA: Insufficient documentation

## 2019-05-18 LAB — GC/CHLAMYDIA PROBE AMP
Chlamydia trachomatis, NAA: NEGATIVE
Neisseria Gonorrhoeae by PCR: NEGATIVE

## 2019-05-18 NOTE — Assessment & Plan Note (Signed)
Pt reassured, to continue same tx

## 2019-05-18 NOTE — Assessment & Plan Note (Signed)
stable overall by history and exam, recent data reviewed with pt, and pt to continue medical treatment as before,  to f/u any worsening symptoms or concerns  

## 2019-05-18 NOTE — Assessment & Plan Note (Signed)
Now about 5 days post exposure, for Urine gc/chlamydia and RPR though may be too soon for positive testing; will tx empirically with pen G 2.4 m units IM, doxy 10 bid x 7 days, and azithro 1gm total 1 time dosig

## 2019-06-03 ENCOUNTER — Other Ambulatory Visit: Payer: Self-pay

## 2019-06-03 ENCOUNTER — Encounter: Payer: Self-pay | Admitting: Internal Medicine

## 2019-06-03 ENCOUNTER — Ambulatory Visit (INDEPENDENT_AMBULATORY_CARE_PROVIDER_SITE_OTHER): Payer: BC Managed Care – PPO | Admitting: Internal Medicine

## 2019-06-03 VITALS — BP 128/86 | HR 78 | Temp 97.8°F | Ht 68.0 in | Wt 160.0 lb

## 2019-06-03 DIAGNOSIS — Z7252 High risk homosexual behavior: Secondary | ICD-10-CM

## 2019-06-03 DIAGNOSIS — E785 Hyperlipidemia, unspecified: Secondary | ICD-10-CM

## 2019-06-03 DIAGNOSIS — Z23 Encounter for immunization: Secondary | ICD-10-CM | POA: Diagnosis not present

## 2019-06-03 DIAGNOSIS — G63 Polyneuropathy in diseases classified elsewhere: Secondary | ICD-10-CM

## 2019-06-03 DIAGNOSIS — E559 Vitamin D deficiency, unspecified: Secondary | ICD-10-CM

## 2019-06-03 DIAGNOSIS — Z202 Contact with and (suspected) exposure to infections with a predominantly sexual mode of transmission: Secondary | ICD-10-CM | POA: Diagnosis not present

## 2019-06-03 DIAGNOSIS — Z0000 Encounter for general adult medical examination without abnormal findings: Secondary | ICD-10-CM | POA: Diagnosis not present

## 2019-06-03 DIAGNOSIS — I10 Essential (primary) hypertension: Secondary | ICD-10-CM | POA: Diagnosis not present

## 2019-06-03 DIAGNOSIS — E538 Deficiency of other specified B group vitamins: Secondary | ICD-10-CM | POA: Diagnosis not present

## 2019-06-03 LAB — CBC WITH DIFFERENTIAL/PLATELET
Basophils Absolute: 0 10*3/uL (ref 0.0–0.1)
Basophils Relative: 0.6 % (ref 0.0–3.0)
Eosinophils Absolute: 0.1 10*3/uL (ref 0.0–0.7)
Eosinophils Relative: 1.3 % (ref 0.0–5.0)
HCT: 42.1 % (ref 39.0–52.0)
Hemoglobin: 14.3 g/dL (ref 13.0–17.0)
Lymphocytes Relative: 20.9 % (ref 12.0–46.0)
Lymphs Abs: 1.3 10*3/uL (ref 0.7–4.0)
MCHC: 34 g/dL (ref 30.0–36.0)
MCV: 101.4 fl — ABNORMAL HIGH (ref 78.0–100.0)
Monocytes Absolute: 0.9 10*3/uL (ref 0.1–1.0)
Monocytes Relative: 14.4 % — ABNORMAL HIGH (ref 3.0–12.0)
Neutro Abs: 3.9 10*3/uL (ref 1.4–7.7)
Neutrophils Relative %: 62.8 % (ref 43.0–77.0)
Platelets: 292 10*3/uL (ref 150.0–400.0)
RBC: 4.15 Mil/uL — ABNORMAL LOW (ref 4.22–5.81)
RDW: 12.8 % (ref 11.5–15.5)
WBC: 6.2 10*3/uL (ref 4.0–10.5)

## 2019-06-03 LAB — LIPID PANEL
Cholesterol: 236 mg/dL — ABNORMAL HIGH (ref 0–200)
HDL: 54.6 mg/dL (ref >39.00)
LDL Cholesterol: 159 mg/dL — ABNORMAL HIGH (ref 0–99)
NonHDL: 181.61
Total CHOL/HDL Ratio: 4
Triglycerides: 112 mg/dL (ref 0.0–149.0)
VLDL: 22.4 mg/dL (ref 0.0–40.0)

## 2019-06-03 LAB — BASIC METABOLIC PANEL
BUN: 18 mg/dL (ref 6–23)
CO2: 31 mEq/L (ref 19–32)
Calcium: 9.7 mg/dL (ref 8.4–10.5)
Chloride: 101 mEq/L (ref 96–112)
Creatinine, Ser: 0.94 mg/dL (ref 0.40–1.50)
GFR: 84.36 mL/min (ref >60.00)
Glucose, Bld: 95 mg/dL (ref 70–99)
Potassium: 4 mEq/L (ref 3.5–5.1)
Sodium: 139 mEq/L (ref 135–145)

## 2019-06-03 LAB — URINALYSIS, ROUTINE W REFLEX MICROSCOPIC
Bilirubin Urine: NEGATIVE
Hgb urine dipstick: NEGATIVE
Ketones, ur: NEGATIVE
Leukocytes,Ua: NEGATIVE
Nitrite: NEGATIVE
RBC / HPF: NONE SEEN (ref 0–?)
Specific Gravity, Urine: 1.015 (ref 1.000–1.030)
Total Protein, Urine: NEGATIVE
Urine Glucose: NEGATIVE
Urobilinogen, UA: 0.2 (ref 0.0–1.0)
WBC, UA: NONE SEEN (ref 0–?)
pH: 7.5 (ref 5.0–8.0)

## 2019-06-03 LAB — HEPATIC FUNCTION PANEL
ALT: 14 U/L (ref 0–53)
AST: 15 U/L (ref 0–37)
Albumin: 4.5 g/dL (ref 3.5–5.2)
Alkaline Phosphatase: 73 U/L (ref 39–117)
Bilirubin, Direct: 0.1 mg/dL (ref 0.0–0.3)
Total Bilirubin: 0.7 mg/dL (ref 0.2–1.2)
Total Protein: 6.8 g/dL (ref 6.0–8.3)

## 2019-06-03 LAB — PSA: PSA: 1.08 ng/mL (ref 0.10–4.00)

## 2019-06-03 LAB — VITAMIN D 25 HYDROXY (VIT D DEFICIENCY, FRACTURES): VITD: 50.27 ng/mL (ref 30.00–100.00)

## 2019-06-03 LAB — TSH: TSH: 3.97 u[IU]/mL (ref 0.35–4.50)

## 2019-06-03 NOTE — Patient Instructions (Signed)

## 2019-06-03 NOTE — Progress Notes (Signed)
Subjective:  Patient ID: Caleb Obrien, male    DOB: 22-Aug-1965  Age: 53 y.o. MRN: [id number]  CC: Annual Exam and Hypertension  This visit occurred during the SARS-CoV-2 public health emergency.  Safety protocols were in place, including screening questions prior to the visit, additional usage of staff PPE, and extensive cleaning of exam room while observing appropriate contact time as indicated for disinfecting solutions.   HPI Caleb Obrien presents for a CPX.  He had a recent sexual encounter and was told that he was exposed to syphilis and chlamydia.  He saw another physician and a prescription was written for Bicillin LA but it was never filled because it was too expensive.  He was also prescribed azithromycin and doxycycline.  At that time testing for STIs was all negative.  He has had no symptoms suspicious for an STI but he wants to be rechecked for STIs today.  Outpatient Medications Prior to Visit  Medication Sig Dispense Refill  . ALPRAZolam (XANAX) 0.25 MG tablet TAKE 1/2 TABLET BY MOUTH AS NEEDED 30 tablet 3  . buPROPion (WELLBUTRIN SR) 100 MG 12 hr tablet TAKE ONE TABLET BY MOUTH TWICE A DAY 180 tablet 0  . carvedilol (COREG) 3.125 MG tablet TAKE ONE TABLET BY MOUTH TWICE A DAY WITH A MEAL 180 tablet 0  . cetirizine (ZYRTEC) 10 MG tablet TAKE 1 TABLET(10 MG) BY MOUTH DAILY 30 tablet 11  . Cholecalciferol 2000 units TABS Take 1 tablet (2,000 Units total) by mouth daily. 90 tablet 1  . famotidine (PEPCID) 20 MG tablet Take 20 mg by mouth daily.    . hydrocortisone-pramoxine (PROCTOFOAM-HC) rectal foam Place 1 applicator rectally 3 (three) times daily. 10 g 1  . irbesartan-hydrochlorothiazide (AVALIDE) 300-12.5 MG tablet TAKE ONE TABLET BY MOUTH DAILY 90 tablet 1  . tadalafil (CIALIS) 5 MG tablet Take 1 tablet (5 mg total) by mouth daily as needed for erectile dysfunction. 10 tablet 5  . atorvastatin (LIPITOR) 40 MG tablet TAKE ONE TABLET BY MOUTH DAILY 90 tablet 0  .  azithromycin (ZITHROMAX) 250 MG tablet 4 tab by mouth x 1 day 4 tablet 0  . doxycycline (VIBRA-TABS) 100 MG tablet Take 1 tablet (100 mg total) by mouth 2 (two) times daily. 14 tablet 0  . Penicillin G Benzathine 2400000 UNIT/4ML SUSP 4 ml IM once 4 mL 0   No facility-administered medications prior to visit.    ROS Review of Systems  Constitutional: Negative.  Negative for chills, fatigue and fever.  HENT: Negative.  Negative for sore throat.   Eyes: Negative.   Respiratory: Negative.  Negative for cough, chest tightness, shortness of breath and wheezing.   Cardiovascular: Negative for chest pain, palpitations and leg swelling.  Gastrointestinal: Negative for abdominal pain, diarrhea, nausea and vomiting.  Genitourinary: Negative for dysuria, genital sores, hematuria, scrotal swelling, testicular pain and urgency.  Musculoskeletal: Negative.  Negative for arthralgias and myalgias.  Skin: Negative.  Negative for rash.  Neurological: Negative.  Negative for dizziness, weakness, light-headedness and headaches.  Hematological: Negative for adenopathy. Does not bruise/bleed easily.  Psychiatric/Behavioral: Negative.     Objective:  BP 128/86 (BP Location: Left Arm, Patient Position: Sitting, Cuff Size: Normal)   Pulse 78   Temp 97.8 F (36.6 C) (Oral)   Ht 5\' 8"  (1.727 m)   Wt 160 lb (72.6 kg)   SpO2 98%   BMI 24.33 kg/m   BP Readings from Last 3 Encounters:  06/03/19 128/86  05/16/19 126/84  12/29/18 130/90    Wt Readings from Last 3 Encounters:  06/03/19 160 lb (72.6 kg)  05/16/19 158 lb (71.7 kg)  11/08/18 166 lb 8 oz (75.5 kg)    Physical Exam Vitals reviewed.  Constitutional:      Appearance: Normal appearance.  HENT:     Nose: Nose normal.     Mouth/Throat:     Mouth: Mucous membranes are moist.     Pharynx: No oropharyngeal exudate.  Eyes:     General: No scleral icterus.    Conjunctiva/sclera: Conjunctivae normal.  Cardiovascular:     Rate and Rhythm:  Normal rate and regular rhythm.     Heart sounds: No murmur.  Pulmonary:     Effort: Pulmonary effort is normal. No respiratory distress.     Breath sounds: No stridor. No wheezing, rhonchi or rales.  Abdominal:     General: Abdomen is flat. Bowel sounds are normal. There is no distension.     Palpations: Abdomen is soft. There is no hepatomegaly, splenomegaly or mass.     Tenderness: There is no abdominal tenderness.     Hernia: No hernia is present. There is no hernia in the left inguinal area or right inguinal area.  Genitourinary:    Pubic Area: No rash.      Penis: Normal and circumcised. No swelling or lesions.      Testes: Normal.        Right: Mass, tenderness or swelling not present.        Left: Mass, tenderness or swelling not present.     Epididymis:     Right: Normal. Not inflamed or enlarged. No mass.     Left: Normal. Not inflamed or enlarged. No mass.     Prostate: Normal. Not enlarged, not tender and no nodules present.     Rectum: Guaiac result negative. Internal hemorrhoid present. No mass, tenderness, anal fissure or external hemorrhoid. Normal anal tone.     Comments: He has small, uncomplicated internal anal hemorrhoids. Musculoskeletal:        General: Normal range of motion.     Cervical back: Neck supple.  Lymphadenopathy:     Cervical: No cervical adenopathy.     Lower Body: No right inguinal adenopathy. No left inguinal adenopathy.  Skin:    General: Skin is warm and dry.     Findings: No rash.  Neurological:     General: No focal deficit present.     Mental Status: He is alert.  Psychiatric:        Mood and Affect: Mood normal.        Behavior: Behavior normal.     Lab Results  Component Value Date   WBC 6.2 06/03/2019   HGB 14.3 06/03/2019   HCT 42.1 06/03/2019   PLT 292.0 06/03/2019   GLUCOSE 95 06/03/2019   CHOL 236 (H) 06/03/2019   TRIG 112.0 06/03/2019   HDL 54.60 06/03/2019   LDLDIRECT 144.4 01/27/2012   LDLCALC 159 (H) 06/03/2019     ALT 14 06/03/2019   AST 15 06/03/2019   NA 139 06/03/2019   K 4.0 06/03/2019   CL 101 06/03/2019   CREATININE 0.94 06/03/2019   BUN 18 06/03/2019   CO2 31 06/03/2019   TSH 3.97 06/03/2019   PSA 1.08 06/03/2019    DG Ribs Unilateral W/Chest Left  Result Date: 04/06/2016 CLINICAL DATA:  Fall 4 days ago with left-sided chest pain EXAM: LEFT RIBS AND CHEST - 3+ VIEW COMPARISON:  01/24/2014 FINDINGS:  Cardiac shadow is within normal limits. The lungs are clear bilaterally. No pneumothorax or sizable effusion is noted. Minimal irregularity of the anterior aspect of the left sixth rib is noted which may represent an undisplaced fracture. No other fractures are noted. IMPRESSION: Irregularity of the anterior aspect of the left sixth rib which may represent an undisplaced fracture. Electronically Signed   By: Inez Catalina M.D.   On: 04/06/2016 15:14    Assessment & Plan:   Debra was seen today for annual exam and hypertension.  Diagnoses and all orders for this visit:  Essential hypertension, benign- His blood pressure is adequately well controlled. 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 -     CBC with Differential  Routine general medical examination at a health care facility- Exam completed, labs reviewed, vaccines reviewed and updated, colon cancer screening is up-to-date, patient education was given. -     PSA -     Lipid panel -     HIV antibody ([place])  Vitamin D insufficiency- His vitamin D level is normal now. -     Vitamin D 25 hydroxy  Hyperlipidemia with target LDL less than 130- He has not achieved his LDL goal.  He will restart the statin for CV risk reduction. -     Hepatic function panel -     atorvastatin (LIPITOR) 40 MG tablet; Take 1 tablet (40 mg total) by mouth daily.  STD exposure- Screening for STIs  continues to be negative. -     RPR -     Chlamydia/GC NAA, Confirmation  High risk homosexual behavior -     RPR -     Chlamydia/GC NAA, Confirmation  Need for HPV vaccination -     HPV 9-valent vaccine,Recombinat   I have discontinued Jeneen Rinks C. Reali "Chris"'s doxycycline, azithromycin, and Penicillin G Benzathine. I am also having him maintain his cetirizine, Cholecalciferol, famotidine, tadalafil, ALPRAZolam, irbesartan-hydrochlorothiazide, hydrocortisone-pramoxine, carvedilol, buPROPion, and atorvastatin.  Meds ordered this encounter  Medications  . atorvastatin (LIPITOR) 40 MG tablet    Sig: Take 1 tablet (40 mg total) by mouth daily.    Dispense:  90 tablet    Refill:  1     Follow-up: Return in about 6 months (around 12/02/2019).  Scarlette Calico, MD

## 2019-06-04 ENCOUNTER — Other Ambulatory Visit: Payer: Self-pay | Admitting: Internal Medicine

## 2019-06-04 ENCOUNTER — Encounter: Payer: Self-pay | Admitting: Internal Medicine

## 2019-06-04 LAB — RPR: RPR Ser Ql: NONREACTIVE

## 2019-06-04 LAB — HIV ANTIBODY (ROUTINE TESTING W REFLEX): HIV 1&2 Ab, 4th Generation: NONREACTIVE

## 2019-06-05 ENCOUNTER — Other Ambulatory Visit: Payer: Self-pay | Admitting: Internal Medicine

## 2019-06-05 ENCOUNTER — Encounter: Payer: Self-pay | Admitting: Internal Medicine

## 2019-06-05 DIAGNOSIS — I10 Essential (primary) hypertension: Secondary | ICD-10-CM

## 2019-06-05 LAB — CHLAMYDIA/GC NAA, CONFIRMATION
Chlamydia trachomatis, NAA: NEGATIVE
Neisseria gonorrhoeae, NAA: NEGATIVE

## 2019-06-05 MED ORDER — ATORVASTATIN CALCIUM 40 MG PO TABS: 40.0000 mg | ORAL_TABLET | Freq: Every day | ORAL | 1 refills | Status: DC

## 2019-06-10 ENCOUNTER — Encounter: Payer: Self-pay | Admitting: Internal Medicine

## 2019-06-10 ENCOUNTER — Other Ambulatory Visit: Payer: Self-pay | Admitting: Internal Medicine

## 2019-06-10 DIAGNOSIS — I10 Essential (primary) hypertension: Secondary | ICD-10-CM

## 2019-06-10 MED ORDER — IRBESARTAN-HYDROCHLOROTHIAZIDE 300-12.5 MG PO TABS: 1.0000 | ORAL_TABLET | Freq: Every day | ORAL | 1 refills | Status: DC

## 2019-06-19 ENCOUNTER — Other Ambulatory Visit: Payer: Self-pay | Admitting: Internal Medicine

## 2019-06-19 DIAGNOSIS — I10 Essential (primary) hypertension: Secondary | ICD-10-CM

## 2019-06-24 ENCOUNTER — Other Ambulatory Visit: Payer: Self-pay | Admitting: Internal Medicine

## 2019-06-24 DIAGNOSIS — F411 Generalized anxiety disorder: Secondary | ICD-10-CM

## 2019-07-02 DIAGNOSIS — Z03818 Encounter for observation for suspected exposure to other biological agents ruled out: Secondary | ICD-10-CM | POA: Diagnosis not present

## 2019-08-14 ENCOUNTER — Encounter: Payer: Self-pay | Admitting: Internal Medicine

## 2019-08-15 ENCOUNTER — Ambulatory Visit: Payer: BLUE CROSS/BLUE SHIELD | Admitting: Adult Health

## 2019-08-15 ENCOUNTER — Ambulatory Visit (INDEPENDENT_AMBULATORY_CARE_PROVIDER_SITE_OTHER): Payer: BC Managed Care – PPO | Admitting: Adult Health

## 2019-08-15 ENCOUNTER — Encounter: Payer: Self-pay | Admitting: Adult Health

## 2019-08-15 ENCOUNTER — Other Ambulatory Visit: Payer: Self-pay

## 2019-08-15 VITALS — BP 134/86 | HR 86 | Ht 68.0 in | Wt 163.8 lb

## 2019-08-15 DIAGNOSIS — Z9989 Dependence on other enabling machines and devices: Secondary | ICD-10-CM

## 2019-08-15 DIAGNOSIS — G4733 Obstructive sleep apnea (adult) (pediatric): Secondary | ICD-10-CM | POA: Diagnosis not present

## 2019-08-15 NOTE — Patient Instructions (Signed)
Continue using CPAP nightly and greater than 4 hours each night °If your symptoms worsen or you develop new symptoms please let us know.  ° °

## 2019-08-15 NOTE — Progress Notes (Signed)
PATIENT: Caleb Obrien DOB: [birth year]  REASON FOR VISIT: follow up Caleb FROM: patient  Caleb OF PRESENT ILLNESS: Today 08/15/19:  Caleb Obrien is a 54 year old male with a Caleb of obstructive sleep apnea on CPAP.  His download indicates that he uses machine 20 out of 30 days for compliance of 67%.  He only uses machine greater than 4 hours 16 days for compliance of 53%.  On average he uses his machine 5 hours and 5 minutes.  His residual AHI is 0.5 on 5 to 10 cm of water with EPR 3.  Leak in the 95th percentile is 7.5.  Caleb Obrien is a 54 y.o. male and is here for a regular yearly CPAP follow-up.  The patient has been a compliant CPAP user his blood pressure is well controlled he is not diabetic and in the meantime since his last visit with me he has quit smoking in May 2019.  He also has undergone a blepharoplasty to correct ptosis.  He states that there are no difficulties with using CPAP and the results is very positive.  He uses the machine 30 out of 30 days with 75% compliance for 4 hours or more of consecutive use.  Average user time is 5 hours 29 minutes, he is using an AutoSet between 5 and 10 cmH2O with 3 cm EPR and the residual AHI is 0.5 which speaks for a good resolution of apnea.  The 95th percentile pressure is 7.8 and there are equal numbers of central and obstructive apneas within the residual count.  He has minimal air leakage at this pressure the amount is not varying very much from night to night. He uses nasal pillows.   REVIEW OF SYSTEMS: Out of a complete 14 system review of symptoms, the patient complains only of the following symptoms, and all other reviewed systems are negative.  FSS 19 ESS 1  ALLERGIES: Allergies  Allergen Reactions  . Amlodipine     cough  . Lisinopril     cough  . Sudafed [Pseudoephedrine Hcl] Other (See Comments)    prostatism    HOME MEDICATIONS: Outpatient Medications Prior to Visit  Medication Sig  Dispense Refill  . ALPRAZolam (XANAX) 0.25 MG tablet TAKE 1/2 TABLET BY MOUTH AS NEEDED 30 tablet 3  . atorvastatin (LIPITOR) 40 MG tablet Take 1 tablet (40 mg total) by mouth daily. 90 tablet 1  . buPROPion (WELLBUTRIN SR) 100 MG 12 hr tablet TAKE ONE TABLET BY MOUTH TWICE A DAY 180 tablet 0  . carvedilol (COREG) 3.125 MG tablet TAKE ONE TABLET BY MOUTH TWICE A DAY WITH A MEAL 180 tablet 1  . cetirizine (ZYRTEC) 10 MG tablet TAKE 1 TABLET(10 MG) BY MOUTH DAILY 30 tablet 11  . Cholecalciferol 2000 units TABS Take 1 tablet (2,000 Units total) by mouth daily. 90 tablet 1  . famotidine (PEPCID) 20 MG tablet Take 20 mg by mouth daily.    . hydrocortisone-pramoxine (PROCTOFOAM-HC) rectal foam Place 1 applicator rectally 3 (three) times daily. 10 g 1  . irbesartan-hydrochlorothiazide (AVALIDE) 300-12.5 MG tablet Take 1 tablet by mouth daily. 90 tablet 1  . tadalafil (CIALIS) 5 MG tablet Take 1 tablet (5 mg total) by mouth daily as needed for erectile dysfunction. 10 tablet 5   No facility-administered medications prior to visit.    PAST MEDICAL Caleb: Past Medical Caleb:  Diagnosis Date  . ALLERGIC RHINITIS CAUSE UNSPECIFIED 02/24/2009  . Allergy   . ASTHMA 02/05/2007  . BENIGN  PROSTATIC HYPERTROPHY, WITH URINARY OBSTRUCTION 05/22/2007  . Dermatophytosis of foot 11/02/2009  . GANGLION CYST, WRIST, LEFT 03/25/2009  . Hemangioma of skin and subcutaneous tissue 06/30/2009  . HEPATITIS B, HX OF 02/05/2007   per pt no Caleb  . [place], [place] 04/21/2008  . PLANTAR WART, RIGHT 01/23/2009  . Sleep apnea    uses c-pap    PAST SURGICAL Caleb: Past Surgical Caleb:  Procedure Laterality Date  . eyelid surgery  04/11/2018   [place]  . WISDOM TOOTH EXTRACTION      FAMILY Caleb: Family Caleb  Problem Relation Age of Onset  . Hypertension Mother   . Arthritis Mother   . Prostate cancer Father   . Healthy Sister   . Colon cancer Neg Hx   . Esophageal cancer Neg Hx   .  Stomach cancer Neg Hx   . Rectal cancer Neg Hx     SOCIAL Caleb: Social Caleb   Socioeconomic Caleb  . Marital status: Single    Spouse name: Not on file  . Number of children: Not on file  . Years of education: Not on file  . Highest education level: Not on file  Occupational Caleb  . Occupation: HR    Employer: [employer]  Tobacco Use  . Smoking status: Former Smoker    Packs/day: 0.30    Types: Cigarettes    Quit date: 10/2017    Years since quitting: 1.8  . Smokeless tobacco: Never Used  Substance and Sexual Activity  . Alcohol use: Yes    Alcohol/week: 7.0 standard drinks    Types: 7 Standard drinks or equivalent per week  . Drug use: No  . Sexual activity: Yes  Other Topics Concern  . Not on file  Social Caleb Narrative  . Not on file   Social Determinants of Health   Financial Resource Strain:   . Difficulty of Paying Living Expenses: Not on file  Food Insecurity:   . Worried About Charity fundraiser in the Last Year: Not on file  . Ran Out of Food in the Last Year: Not on file  Transportation Needs:   . Lack of Transportation (Medical): Not on file  . Lack of Transportation (Non-Medical): Not on file  Physical Activity:   . Days of Exercise per Week: Not on file  . Minutes of Exercise per Session: Not on file  Stress:   . Feeling of Stress : Not on file  Social Connections:   . Frequency of Communication with Friends and Family: Not on file  . Frequency of Social Gatherings with Friends and Family: Not on file  . Attends Religious Services: Not on file  . Active Member of Clubs or Organizations: Not on file  . Attends Archivist Meetings: Not on file  . Marital Status: Not on file  Intimate Partner Violence:   . Fear of Current or Ex-Partner: Not on file  . Emotionally Abused: Not on file  . Physically Abused: Not on file  . Sexually Abused: Not on file      PHYSICAL EXAM  Vitals:   08/15/19 0747  BP:  134/86  Pulse: 86  Weight: 163 lb 12.8 oz (74.3 kg)  Height: 5\' 8"  (1.727 m)   Body mass index is 24.91 kg/m.  Generalized: Well developed, in no acute distress  Chest: Lungs clear to auscultation bilaterally  Neurological examination  Mentation: Alert oriented to time, place, Caleb taking. Follows all commands speech and language fluent Cranial nerve  II-XII: Extraocular movements were full, visual field were full on confrontational test Head turning and shoulder shrug  were normal and symmetric. Motor: The motor testing reveals 5 over 5 strength of all 4 extremities. Good symmetric motor tone is noted throughout.  Sensory: Sensory testing is intact to soft touch on all 4 extremities. No evidence of extinction is noted.  Gait and station: Gait is normal.    DIAGNOSTIC DATA (LABS, IMAGING, TESTING) - I reviewed patient records, labs, notes, testing and imaging myself where available.  Lab Results  Component Value Date   WBC 6.2 06/03/2019   HGB 14.3 06/03/2019   HCT 42.1 06/03/2019   MCV 101.4 (H) 06/03/2019   PLT 292.0 06/03/2019      Component Value Date/Time   NA 139 06/03/2019 0918   K 4.0 06/03/2019 0918   CL 101 06/03/2019 0918   CO2 31 06/03/2019 0918   GLUCOSE 95 06/03/2019 0918   BUN 18 06/03/2019 0918   CREATININE 0.94 06/03/2019 0918   CALCIUM 9.7 06/03/2019 0918   PROT 6.8 06/03/2019 0918   ALBUMIN 4.5 06/03/2019 0918   AST 15 06/03/2019 0918   ALT 14 06/03/2019 0918   ALKPHOS 73 06/03/2019 0918   BILITOT 0.7 06/03/2019 0918   GFRNONAA 77.08 01/18/2010 0755   GFRAA 95 01/10/2008 0931   Lab Results  Component Value Date   CHOL 236 (H) 06/03/2019   HDL 54.60 06/03/2019   LDLCALC 159 (H) 06/03/2019   LDLDIRECT 144.4 01/27/2012   TRIG 112.0 06/03/2019   CHOLHDL 4 06/03/2019   No results found for: HGBA1C Lab Results  Component Value Date   VITAMINB12 416 05/23/2018   Lab Results  Component Value Date   TSH 3.97 06/03/2019      ASSESSMENT  AND PLAN 54 y.o. year old male  has a past medical Caleb of ALLERGIC RHINITIS CAUSE UNSPECIFIED (02/24/2009), Allergy, ASTHMA (02/05/2007), BENIGN PROSTATIC HYPERTROPHY, WITH URINARY OBSTRUCTION (05/22/2007), Dermatophytosis of foot (11/02/2009), GANGLION CYST, WRIST, LEFT (03/25/2009), Hemangioma of skin and subcutaneous tissue (06/30/2009), HEPATITIS B, HX OF (02/05/2007), HYPERLIPIDEMIA, BORDERLINE (04/21/2008), PLANTAR WART, RIGHT (01/23/2009), and Sleep apnea. here with:  1. OSA on CPAP  - CPAP compliance excellent - Good treatment of AHI  - Encourage patient to use CPAP nightly and > 4 hours each night - F/U in 1 year or sooner if needed   I spent 15 minutes of face-to-face and non-face-to-face time with patient.  This included previsit chart review, study review, electronic health record documentation, patient education.  Ward Givens, MSN, NP-C 08/15/2019, 7:55 AM [facility] [address], [place], [place] [address] [phone]

## 2019-08-17 ENCOUNTER — Ambulatory Visit: Payer: BC Managed Care – PPO | Attending: Internal Medicine

## 2019-08-17 DIAGNOSIS — Z23 Encounter for immunization: Secondary | ICD-10-CM | POA: Insufficient documentation

## 2019-08-17 NOTE — Progress Notes (Signed)
   Covid-19 Vaccination Clinic  Name:  Caleb Obrien    MRN: [id number] DOB: January 24, 1966  08/17/2019  [name] was observed post Covid-19 immunization for 15 minutes without incident. He was provided with Vaccine Information Sheet and instruction to access the V-Safe system.   [name] was instructed to call 911 with any severe reactions post vaccine: Marland Kitchen Difficulty breathing  . Swelling of face and throat  . A fast heartbeat  . A bad rash all over body  . Dizziness and weakness   Immunizations Administered    Name Date Dose VIS Date Route   Pfizer COVID-19 Vaccine 08/17/2019 10:21 AM 0.3 mL 05/24/2019 Intramuscular   Manufacturer: [place]   Lot: [id number]   [place]: [id number]

## 2019-09-06 ENCOUNTER — Other Ambulatory Visit: Payer: Self-pay | Admitting: Internal Medicine

## 2019-09-06 ENCOUNTER — Encounter: Payer: Self-pay | Admitting: Internal Medicine

## 2019-09-06 DIAGNOSIS — E785 Hyperlipidemia, unspecified: Secondary | ICD-10-CM

## 2019-09-07 ENCOUNTER — Ambulatory Visit: Payer: BC Managed Care – PPO | Attending: Internal Medicine

## 2019-09-07 DIAGNOSIS — Z23 Encounter for immunization: Secondary | ICD-10-CM

## 2019-09-07 NOTE — Progress Notes (Signed)
   Covid-19 Vaccination Clinic  Name:  GRANVILLE LOCHRIDGE    MRN: [id number] DOB: [birth year]  09/07/2019  Mr. Lancour was observed post Covid-19 immunization for 15 minutes without incident. He was provided with Vaccine Information Sheet and instruction to access the V-Safe system.   Mr. Similien was instructed to call 911 with any severe reactions post vaccine: Marland Kitchen Difficulty breathing  . Swelling of face and throat  . A fast heartbeat  . A bad rash all over body  . Dizziness and weakness   Immunizations Administered    Name Date Dose VIS Date Route   Pfizer COVID-19 Vaccine 09/07/2019 11:26 AM 0.3 mL 05/24/2019 Intramuscular   Manufacturer: [place]   Lot: [id number]   [place]: [id number]

## 2019-09-30 DIAGNOSIS — G4733 Obstructive sleep apnea (adult) (pediatric): Secondary | ICD-10-CM | POA: Diagnosis not present

## 2019-10-12 ENCOUNTER — Other Ambulatory Visit: Payer: Self-pay | Admitting: Internal Medicine

## 2019-10-12 DIAGNOSIS — F411 Generalized anxiety disorder: Secondary | ICD-10-CM

## 2019-12-05 ENCOUNTER — Encounter: Payer: Self-pay | Admitting: Internal Medicine

## 2019-12-05 ENCOUNTER — Other Ambulatory Visit: Payer: Self-pay

## 2019-12-05 ENCOUNTER — Ambulatory Visit: Payer: BC Managed Care – PPO | Admitting: Internal Medicine

## 2019-12-05 VITALS — BP 138/88 | HR 84 | Temp 98.0°F | Resp 16 | Ht 68.0 in | Wt 167.1 lb

## 2019-12-05 DIAGNOSIS — G63 Polyneuropathy in diseases classified elsewhere: Secondary | ICD-10-CM | POA: Diagnosis not present

## 2019-12-05 DIAGNOSIS — I10 Essential (primary) hypertension: Secondary | ICD-10-CM | POA: Diagnosis not present

## 2019-12-05 DIAGNOSIS — E785 Hyperlipidemia, unspecified: Secondary | ICD-10-CM | POA: Diagnosis not present

## 2019-12-05 DIAGNOSIS — A510 Primary genital syphilis: Secondary | ICD-10-CM

## 2019-12-05 DIAGNOSIS — J4531 Mild persistent asthma with (acute) exacerbation: Secondary | ICD-10-CM

## 2019-12-05 DIAGNOSIS — N522 Drug-induced erectile dysfunction: Secondary | ICD-10-CM | POA: Diagnosis not present

## 2019-12-05 DIAGNOSIS — E538 Deficiency of other specified B group vitamins: Secondary | ICD-10-CM

## 2019-12-05 DIAGNOSIS — R059 Cough, unspecified: Secondary | ICD-10-CM | POA: Insufficient documentation

## 2019-12-05 DIAGNOSIS — E559 Vitamin D deficiency, unspecified: Secondary | ICD-10-CM | POA: Diagnosis not present

## 2019-12-05 DIAGNOSIS — Z7252 High risk homosexual behavior: Secondary | ICD-10-CM

## 2019-12-05 DIAGNOSIS — R05 Cough: Secondary | ICD-10-CM | POA: Diagnosis not present

## 2019-12-05 LAB — CBC WITH DIFFERENTIAL/PLATELET
Basophils Absolute: 0 10*3/uL (ref 0.0–0.1)
Basophils Relative: 0.8 % (ref 0.0–3.0)
Eosinophils Absolute: 0.1 10*3/uL (ref 0.0–0.7)
Eosinophils Relative: 1.3 % (ref 0.0–5.0)
HCT: 41.2 % (ref 39.0–52.0)
Hemoglobin: 14.1 g/dL (ref 13.0–17.0)
Lymphocytes Relative: 21.1 % (ref 12.0–46.0)
Lymphs Abs: 1.3 10*3/uL (ref 0.7–4.0)
MCHC: 34.2 g/dL (ref 30.0–36.0)
MCV: 101 fl — ABNORMAL HIGH (ref 78.0–100.0)
Monocytes Absolute: 1.1 10*3/uL — ABNORMAL HIGH (ref 0.1–1.0)
Monocytes Relative: 18.4 % — ABNORMAL HIGH (ref 3.0–12.0)
Neutro Abs: 3.6 10*3/uL (ref 1.4–7.7)
Neutrophils Relative %: 58.4 % (ref 43.0–77.0)
Platelets: 293 10*3/uL (ref 150.0–400.0)
RBC: 4.08 Mil/uL — ABNORMAL LOW (ref 4.22–5.81)
RDW: 13.3 % (ref 11.5–15.5)
WBC: 6.2 10*3/uL (ref 4.0–10.5)

## 2019-12-05 LAB — URINALYSIS, ROUTINE W REFLEX MICROSCOPIC
Bilirubin Urine: NEGATIVE
Hgb urine dipstick: NEGATIVE
Ketones, ur: NEGATIVE
Nitrite: NEGATIVE
Specific Gravity, Urine: 1.015 (ref 1.000–1.030)
Total Protein, Urine: NEGATIVE
Urine Glucose: NEGATIVE
Urobilinogen, UA: 0.2 (ref 0.0–1.0)
pH: 7.5 (ref 5.0–8.0)

## 2019-12-05 LAB — LIPID PANEL
Cholesterol: 136 mg/dL (ref 0–200)
HDL: 52.2 mg/dL (ref >39.00)
LDL Cholesterol: 73 mg/dL (ref 0–99)
NonHDL: 84.29
Total CHOL/HDL Ratio: 3
Triglycerides: 55 mg/dL (ref 0.0–149.0)
VLDL: 11 mg/dL (ref 0.0–40.0)

## 2019-12-05 LAB — BASIC METABOLIC PANEL
BUN: 16 mg/dL (ref 6–23)
CO2: 29 mEq/L (ref 19–32)
Calcium: 9.8 mg/dL (ref 8.4–10.5)
Chloride: 101 mEq/L (ref 96–112)
Creatinine, Ser: 0.96 mg/dL (ref 0.40–1.50)
GFR: 82.17 mL/min (ref >60.00)
Glucose, Bld: 97 mg/dL (ref 70–99)
Potassium: 4.2 mEq/L (ref 3.5–5.1)
Sodium: 137 mEq/L (ref 135–145)

## 2019-12-05 LAB — FOLATE: Folate: >24.8 ng/mL (ref >5.9)

## 2019-12-05 LAB — TSH: TSH: 4.19 u[IU]/mL (ref 0.35–4.50)

## 2019-12-05 LAB — POCT EXHALED NITRIC OXIDE: FeNO level (ppb): 17

## 2019-12-05 LAB — VITAMIN B12: Vitamin B-12: 569 pg/mL (ref 211–911)

## 2019-12-05 LAB — VITAMIN D 25 HYDROXY (VIT D DEFICIENCY, FRACTURES): VITD: 60.9 ng/mL (ref 30.00–100.00)

## 2019-12-05 MED ORDER — ALBUTEROL SULFATE HFA 108 (90 BASE) MCG/ACT IN AERS: 1.0000 | INHALATION_SPRAY | Freq: Four times a day (QID) | RESPIRATORY_TRACT | 5 refills | Status: DC | PRN

## 2019-12-05 MED ORDER — TADALAFIL 20 MG PO TABS: 20.0000 mg | ORAL_TABLET | Freq: Every day | ORAL | 5 refills | Status: DC | PRN

## 2019-12-05 MED ORDER — BREO ELLIPTA 100-25 MCG/INH IN AEPB: 1.0000 | INHALATION_SPRAY | Freq: Every day | RESPIRATORY_TRACT | 1 refills | Status: DC

## 2019-12-05 NOTE — Patient Instructions (Signed)
http://www.aaaai.org/conditions-and-treatments/asthma">  Asthma, Adult  Asthma is a long-term (chronic) condition that causes recurrent episodes in which the airways become tight and narrow. The airways are the passages that lead from the nose and mouth down into the lungs. Asthma episodes, also called asthma attacks, can cause coughing, wheezing, shortness of breath, and chest pain. The airways can also fill with mucus. During an attack, it can be difficult to breathe. Asthma attacks can range from minor to life threatening. Asthma cannot be cured, but medicines and lifestyle changes can help control it and treat acute attacks. What are the causes? This condition is believed to be caused by inherited (genetic) and environmental factors, but its exact cause is not known. There are many things that can bring on an asthma attack or make asthma symptoms worse (triggers). Asthma triggers are different for each person. Common triggers include:  Mold.  Dust.  Cigarette smoke.  Cockroaches.  Things that can cause allergy symptoms (allergens), such as animal dander or pollen from trees or grass.  Air pollutants such as household cleaners, wood smoke, smog, or chemical odors.  Cold air, weather changes, and winds (which increase molds and pollen in the air).  Strong emotional expressions such as crying or laughing hard.  Stress.  Certain medicines (such as aspirin) or types of medicines (such as beta-blockers).  Sulfites in foods and drinks. Foods and drinks that may contain sulfites include dried fruit, potato chips, and sparkling grape juice.  Infections or inflammatory conditions such as the flu, a cold, or inflammation of the nasal membranes (rhinitis).  Gastroesophageal reflux disease (GERD).  Exercise or strenuous activity. What are the signs or symptoms? Symptoms of this condition may occur right after asthma is triggered or many hours later. Symptoms include:  Wheezing. This can  sound like whistling when you breathe.  Excessive nighttime or early morning coughing.  Frequent or severe coughing with a common cold.  Chest tightness.  Shortness of breath.  Tiredness (fatigue) with minimal activity. How is this diagnosed? This condition is diagnosed based on:  Your medical history.  A physical exam.  Tests, which may include: ? Lung function studies and pulmonary studies (spirometry). These tests can evaluate the flow of air in your lungs. ? Allergy tests. ? Imaging tests, such as X-rays. How is this treated? There is no cure for this condition, but treatment can help control your symptoms. Treatment for asthma usually involves:  Identifying and avoiding your asthma triggers.  Using medicines to control your symptoms. Generally, two types of medicines are used to treat asthma: ? Controller medicines. These help prevent asthma symptoms from occurring. They are usually taken every day. ? Fast-acting reliever or rescue medicines. These quickly relieve asthma symptoms by widening the narrow and tight airways. They are used as needed and provide short-term relief.  Using supplemental oxygen. This may be needed during a severe episode.  Using other medicines, such as: ? Allergy medicines, such as antihistamines, if your asthma attacks are triggered by allergens. ? Immune medicines (immunomodulators). These are medicines that help control the immune system.  Creating an asthma action plan. An asthma action plan is a written plan for managing and treating your asthma attacks. This plan includes: ? A list of your asthma triggers and how to avoid them. ? Information about when medicines should be taken and when their dosage should be changed. ? Instructions about using a device called a peak flow meter. A peak flow meter measures how well the lungs are working   and the severity of your asthma. It helps you monitor your condition. Follow these instructions at  home: Controlling your home environment Control your home environment in the following ways to help avoid triggers and prevent asthma attacks:  Change your heating and air conditioning filter regularly.  Limit your use of fireplaces and wood stoves.  Get rid of pests (such as roaches and mice) and their droppings.  Throw away plants if you see mold on them.  Clean floors and dust surfaces regularly. Use unscented cleaning products.  Try to have someone else vacuum for you regularly. Stay out of rooms while they are being vacuumed and for a short while afterward. If you vacuum, use a dust mask from a hardware store, a double-layered or microfilter vacuum cleaner bag, or a vacuum cleaner with a HEPA filter.  Replace carpet with wood, tile, or vinyl flooring. Carpet can trap dander and dust.  Use allergy-proof pillows, mattress covers, and box spring covers.  Keep your bedroom a trigger-free room.  Avoid pets and keep windows closed when allergens are in the air.  Wash beddings every week in hot water and dry them in a dryer.  Use blankets that are made of polyester or cotton.  Clean bathrooms and kitchens with bleach. If possible, have someone repaint the walls in these rooms with mold-resistant paint. Stay out of the rooms that are being cleaned and painted.  Wash your hands often with soap and water. If soap and water are not available, use hand sanitizer.  Do not allow anyone to smoke in your home. General instructions  Take over-the-counter and prescription medicines only as told by your health care provider. ? Speak with your health care provider if you have questions about how or when to take the medicines. ? Make note if you are requiring more frequent dosages.  Do not use any products that contain nicotine or tobacco, such as cigarettes and e-cigarettes. If you need help quitting, ask your health care provider. Also, avoid being exposed to secondhand smoke.  Use a peak  flow meter as told by your health care provider. Record and keep track of the readings.  Understand and use the asthma action plan to help minimize, or stop an asthma attack, without needing to seek medical care.  Make sure you stay up to date on your yearly vaccinations as told by your health care provider. This may include vaccines for the flu and pneumonia.  Avoid outdoor activities when allergen counts are high and when air quality is low.  Wear a ski mask that covers your nose and mouth during outdoor winter activities. Exercise indoors on cold days if you can.  Warm up before exercising, and take time for a cool-down period after exercise.  Keep all follow-up visits as told by your health care provider. This is important. Where to find more information  For information about asthma, turn to the Centers for Disease Control and Prevention at www.cdc.gov/asthma/faqs.htm  For air quality information, turn to [employer]Now at https://airnow.gov/ Contact a health care provider if:  You have wheezing, shortness of breath, or a cough even while you are taking medicine to prevent attacks.  The mucus you cough up (sputum) is thicker than usual.  Your sputum changes from clear or white to yellow, green, gray, or bloody.  Your medicines are causing side effects, such as a rash, itching, swelling, or trouble breathing.  You need to use a reliever medicine more than 2-3 times a week.  Your peak   flow reading is still at 50-79% of your personal best after following your action plan for 1 hour.  You have a fever. Get help right away if:  You are getting worse and do not respond to treatment during an asthma attack.  You are short of breath when at rest or when doing very little physical activity.  You have difficulty eating, drinking, or talking.  You have chest pain or tightness.  You develop a fast heartbeat or palpitations.  You have a bluish color to your lips or fingernails.  You  are light-headed or dizzy, or you faint.  Your peak flow reading is less than 50% of your personal best.  You feel too tired to breathe normally. Summary  Asthma is a long-term (chronic) condition that causes recurrent episodes in which the airways become tight and narrow. These episodes can cause coughing, wheezing, shortness of breath, and chest pain.  Asthma cannot be cured, but medicines and lifestyle changes can help control it and treat acute attacks.  Make sure you understand how to avoid triggers and how and when to use your medicines.  Asthma attacks can range from minor to life threatening. Get help right away if you have an asthma attack and do not respond to treatment with your usual rescue medicines. This information is not intended to replace advice given to you by your health care provider. Make sure you discuss any questions you have with your health care provider. Document Revised: 08/02/2018 Document Reviewed: 07/04/2016 Elsevier Patient Education  2020 [employer]  

## 2019-12-05 NOTE — Progress Notes (Signed)
Subjective:  Patient ID: Caleb Obrien, male    DOB: 1965/08/20  Age: 54 y.o. MRN: [id number]  CC: Hypertension  This visit occurred during the SARS-CoV-2 public health emergency.  Safety protocols were in place, including screening questions prior to the visit, additional usage of staff PPE, and extensive cleaning of exam room while observing appropriate contact time as indicated for disinfecting solutions.    HPI Caleb Obrien presents for f/up - He tells me his blood pressure has been well controlled.  He is active and denies any recent episodes of chest pain, shortness of breath, palpitations, edema, or fatigue.  He complains of low libido, erectile dysfunction, and retrograde ejaculation.  He tells me the current dose of Cialis is not very effective.  He considers his sexual practices to be a high risk and he wants to be screened for STIs.  He denies any recent episodes of sore throat, anal discomfort, penile ulcers, rash, lymphadenopathy, fever, or chills.  He continues to complain of anxiety and wants to continue taking alprazolam.  He is not willing to take an SSRI.  Outpatient Medications Prior to Visit  Medication Sig Dispense Refill  . ALPRAZolam (XANAX) 0.25 MG tablet TAKE 1/2 TABLET BY MOUTH AS NEEDED 30 tablet 3  . atorvastatin (LIPITOR) 40 MG tablet TAKE ONE TABLET BY MOUTH DAILY 90 tablet 1  . buPROPion (WELLBUTRIN SR) 100 MG 12 hr tablet TAKE ONE TABLET BY MOUTH TWICE A DAY 180 tablet 0  . carvedilol (COREG) 3.125 MG tablet TAKE ONE TABLET BY MOUTH TWICE A DAY WITH A MEAL 180 tablet 1  . cetirizine (ZYRTEC) 10 MG tablet TAKE 1 TABLET(10 MG) BY MOUTH DAILY 30 tablet 11  . Cholecalciferol 2000 units TABS Take 1 tablet (2,000 Units total) by mouth daily. 90 tablet 1  . famotidine (PEPCID) 20 MG tablet Take 20 mg by mouth daily.    . irbesartan-hydrochlorothiazide (AVALIDE) 300-12.5 MG tablet Take 1 tablet by mouth daily. 90 tablet 1  . tadalafil (CIALIS) 5 MG tablet Take  1 tablet (5 mg total) by mouth daily as needed for erectile dysfunction. 10 tablet 5  . hydrocortisone-pramoxine (PROCTOFOAM-HC) rectal foam Place 1 applicator rectally 3 (three) times daily. 10 g 1   No facility-administered medications prior to visit.    ROS Review of Systems  Constitutional: Negative.  Negative for chills, diaphoresis, fatigue and fever.  HENT: Negative for sinus pressure, sore throat, trouble swallowing and voice change.   Eyes: Negative.   Respiratory: Positive for cough and wheezing. Negative for chest tightness and shortness of breath.        Chronic NP cough and intermittent wheezing  Cardiovascular: Negative for chest pain, palpitations and leg swelling.  Gastrointestinal: Negative for abdominal pain, constipation, diarrhea, nausea and vomiting.  Endocrine: Negative.   Genitourinary: Negative.  Negative for difficulty urinating, discharge, dysuria, genital sores, hematuria, testicular pain and urgency.  Musculoskeletal: Negative.  Negative for arthralgias, myalgias and neck pain.  Skin: Negative.  Negative for color change, pallor and rash.  Neurological: Negative for dizziness, weakness, light-headedness and numbness.  Hematological: Negative for adenopathy. Does not bruise/bleed easily.  Psychiatric/Behavioral: Negative for agitation, confusion, dysphoric mood, sleep disturbance and suicidal ideas. The patient is nervous/anxious.     Objective:  BP 138/88 (BP Location: Left Arm, Patient Position: Sitting, Cuff Size: Large)   Pulse 84   Temp 98 F (36.7 C) (Oral)   Resp 16   Ht 5\' 8"  (1.727 m)   Wt 167  lb 2 oz (75.8 kg)   SpO2 98%   BMI 25.41 kg/m   BP Readings from Last 3 Encounters:  12/05/19 138/88  08/15/19 134/86  06/03/19 128/86    Wt Readings from Last 3 Encounters:  12/05/19 167 lb 2 oz (75.8 kg)  08/15/19 163 lb 12.8 oz (74.3 kg)  06/03/19 160 lb (72.6 kg)    Physical Exam Constitutional:      Appearance: Normal appearance.  HENT:      Mouth/Throat:     Mouth: Mucous membranes are moist.     Pharynx: No oropharyngeal exudate or posterior oropharyngeal erythema.  Eyes:     General: No scleral icterus.    Conjunctiva/sclera: Conjunctivae normal.  Cardiovascular:     Rate and Rhythm: Normal rate and regular rhythm.     Pulses: Normal pulses.     Heart sounds: No murmur heard.   Pulmonary:     Effort: Pulmonary effort is normal.     Breath sounds: No stridor. No wheezing, rhonchi or rales.  Abdominal:     General: Abdomen is flat.     Palpations: There is no mass.     Tenderness: There is no abdominal tenderness. There is no guarding.  Musculoskeletal:        General: Normal range of motion.     Cervical back: Neck supple.     Right lower leg: No edema.     Left lower leg: No edema.  Lymphadenopathy:     Cervical: No cervical adenopathy.  Skin:    General: Skin is warm and dry.     Coloration: Skin is not pale.     Findings: No rash.  Neurological:     General: No focal deficit present.     Mental Status: He is alert and oriented to person, place, and time. Mental status is at baseline.  Psychiatric:        Attention and Perception: Attention normal. He is attentive.        Mood and Affect: Mood is anxious. Mood is not depressed. Affect is not flat.        Speech: Speech normal.        Behavior: Behavior normal. Behavior is cooperative.     Lab Results  Component Value Date   WBC 6.2 12/05/2019   HGB 14.1 12/05/2019   HCT 41.2 12/05/2019   PLT 293.0 12/05/2019   GLUCOSE 97 12/05/2019   CHOL 136 12/05/2019   TRIG 55.0 12/05/2019   HDL 52.20 12/05/2019   LDLDIRECT 144.4 01/27/2012   LDLCALC 73 12/05/2019   ALT 14 06/03/2019   AST 15 06/03/2019   NA 137 12/05/2019   K 4.2 12/05/2019   CL 101 12/05/2019   CREATININE 0.96 12/05/2019   BUN 16 12/05/2019   CO2 29 12/05/2019   TSH 4.19 12/05/2019   PSA 1.08 06/03/2019    DG Ribs Unilateral W/Chest Left  Result Date: 04/06/2016 CLINICAL  DATA:  Fall 4 days ago with left-sided chest pain EXAM: LEFT RIBS AND CHEST - 3+ VIEW COMPARISON:  01/24/2014 FINDINGS: Cardiac shadow is within normal limits. The lungs are clear bilaterally. No pneumothorax or sizable effusion is noted. Minimal irregularity of the anterior aspect of the left sixth rib is noted which may represent an undisplaced fracture. No other fractures are noted. IMPRESSION: Irregularity of the anterior aspect of the left sixth rib which may represent an undisplaced fracture. Electronically Signed   By: Inez Catalina M.D.   On: 04/06/2016 15:14  Assessment & Plan:   Zia was seen today for hypertension.  Diagnoses and all orders for this visit:  Essential hypertension, benign- His blood pressure is adequately well controlled.  Electrolytes and renal function are normal.  Will continue the combination of carvedilol, and ARB, and the thiazide diuretic. -     TSH; Future -     Basic metabolic panel; Future -     CBC with Differential/Platelet; Future -     Urinalysis, Routine w reflex microscopic; Future -     CBC with Differential/Platelet -     Basic metabolic panel -     TSH -     Urinalysis, Routine w reflex microscopic -     irbesartan-hydrochlorothiazide (AVALIDE) 300-12.5 MG tablet; Take 1 tablet by mouth daily.  Vitamin B12 deficiency neuropathy ([place])- His B12 and folate levels are normal now. -     Vitamin B12; Future -     Folate; Future -     CBC with Differential/Platelet; Future -     CBC with Differential/Platelet -     Folate -     Vitamin B12  Hyperlipidemia with target LDL less than 130- He has achieved his LDL goal and is doing well on the statin. -     Lipid panel; Future -     Lipid panel  Vitamin D insufficiency- His vitamin D level is normal now. -     VITAMIN D 25 Hydroxy (Vit-D Deficiency, Fractures); Future -     VITAMIN D 25 Hydroxy (Vit-D Deficiency, Fractures)  Cough- His FeNO score is not significantly elevated so I do not think a  course of systemic steroids is indicated. -     POCT EXHALED NITRIC OXIDE  Mild persistent asthma with acute exacerbation- He is symptomatic with this so in addition to her rescue inhaler I recommended that he start using an ICS in the LABA. -     CBC with Differential/Platelet; Future -     fluticasone furoate-vilanterol (BREO ELLIPTA) 100-25 MCG/INH AEPB; Inhale 1 puff into the lungs daily. -     albuterol (VENTOLIN HFA) 108 (90 Base) MCG/ACT inhaler; Inhale 1-2 puffs into the lungs every 6 (six) hours as needed for wheezing or shortness of breath. -     CBC with Differential/Platelet  Drug-induced erectile dysfunction- Labs to screen for secondary causes are unremarkable.  I recommended that he increase the dose of tadalafil. -     TSH; Future -     Testosterone Total,Free,Bio, Males; Future -     tadalafil (CIALIS) 20 MG tablet; Take 1 tablet (20 mg total) by mouth daily as needed for erectile dysfunction. 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  It was neg 6 months ago.  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 -     Hepatitis C antibody; Future -     HIV Antibody (routine testing w rflx); Future -     RPR; Future -     Chlamydia/Gonococcus/Trichomonas, NAA; Future -     Chlamydia/Gonococcus/Trichomonas, NAA -     RPR -     HIV Antibody (routine testing w rflx) -     Hepatitis C antibody  Other orders -     Rpr titer -     Fluorescent treponemal ab(fta)-IgG-bld   I have discontinued Jeneen Rinks C. Heiden "Chris"'s tadalafil and hydrocortisone-pramoxine. I am also having him start on tadalafil, Breo Ellipta,  and albuterol. Additionally, I am having him maintain his cetirizine, Cholecalciferol, famotidine, ALPRAZolam, carvedilol, atorvastatin, buPROPion, and irbesartan-hydrochlorothiazide.  Meds ordered this encounter  Medications  . tadalafil (CIALIS) 20 MG tablet    Sig: Take 1 tablet (20 mg total) by mouth daily as  needed for erectile dysfunction.    Dispense:  10 tablet    Refill:  5  . fluticasone furoate-vilanterol (BREO ELLIPTA) 100-25 MCG/INH AEPB    Sig: Inhale 1 puff into the lungs daily.    Dispense:  120 each    Refill:  1  . albuterol (VENTOLIN HFA) 108 (90 Base) MCG/ACT inhaler    Sig: Inhale 1-2 puffs into the lungs every 6 (six) hours as needed for wheezing or shortness of breath.    Dispense:  18 g    Refill:  5  . irbesartan-hydrochlorothiazide (AVALIDE) 300-12.5 MG tablet    Sig: Take 1 tablet by mouth daily.    Dispense:  90 tablet    Refill:  1   I spent 60 minutes in preparing to see the patient by review of recent labs, imaging and procedures, obtaining and reviewing separately obtained history, communicating with the patient and family or caregiver, ordering medications, tests or procedures, and documenting clinical information in the EHR including the differential Dx, treatment, and any further evaluation and other management of 1. Essential hypertension, benign 2. Vitamin B12 deficiency neuropathy ([place]) 3. Hyperlipidemia with target LDL less than 130 4. Vitamin D insufficiency 5. Cough 6. Mild persistent asthma with acute exacerbation 7. Drug-induced erectile dysfunction 8. High risk homosexual behavior     Follow-up: Return in about 3 months (around 03/06/2020).  Scarlette Calico, MD

## 2019-12-06 ENCOUNTER — Encounter: Payer: Self-pay | Admitting: Internal Medicine

## 2019-12-06 LAB — TESTOSTERONE TOTAL,FREE,BIO, MALES
Albumin: 4.4 g/dL (ref 3.6–5.1)
Sex Hormone Binding: 27 nmol/L (ref 10–50)
Testosterone, Bioavailable: 137.2 ng/dL (ref >=110.0)
Testosterone, Free: 68.2 pg/mL (ref 46.0–224.0)
Testosterone: 435 ng/dL (ref 250–827)

## 2019-12-08 ENCOUNTER — Other Ambulatory Visit: Payer: Self-pay | Admitting: Internal Medicine

## 2019-12-08 DIAGNOSIS — I10 Essential (primary) hypertension: Secondary | ICD-10-CM

## 2019-12-08 MED ORDER — IRBESARTAN-HYDROCHLOROTHIAZIDE 300-12.5 MG PO TABS: 1.0000 | ORAL_TABLET | Freq: Every day | ORAL | 1 refills | Status: DC

## 2019-12-09 LAB — HEPATITIS C ANTIBODY
Hepatitis C Ab: NONREACTIVE
SIGNAL TO CUT-OFF: 0 (ref <1.00)

## 2019-12-09 LAB — CHLAMYDIA/GONOCOCCUS/TRICHOMONAS, NAA
Chlamydia by NAA: NEGATIVE
Gonococcus by NAA: NEGATIVE
Trich vag by NAA: NEGATIVE

## 2019-12-09 LAB — RPR TITER: RPR Titer: 1:2 {titer} — ABNORMAL HIGH

## 2019-12-09 LAB — HIV ANTIBODY (ROUTINE TESTING W REFLEX): HIV 1&2 Ab, 4th Generation: NONREACTIVE

## 2019-12-09 LAB — FLUORESCENT TREPONEMAL AB(FTA)-IGG-BLD: Fluorescent Treponemal ABS: NONREACTIVE

## 2019-12-09 LAB — RPR: RPR Ser Ql: REACTIVE — AB

## 2019-12-10 ENCOUNTER — Ambulatory Visit: Payer: BC Managed Care – PPO | Admitting: Internal Medicine

## 2019-12-10 ENCOUNTER — Encounter: Payer: Self-pay | Admitting: Internal Medicine

## 2019-12-10 ENCOUNTER — Ambulatory Visit: Payer: BC Managed Care – PPO | Admitting: Family

## 2019-12-10 ENCOUNTER — Other Ambulatory Visit: Payer: Self-pay

## 2019-12-10 VITALS — BP 136/82 | HR 72 | Temp 98.1°F | Resp 16 | Wt 168.0 lb

## 2019-12-10 DIAGNOSIS — A510 Primary genital syphilis: Secondary | ICD-10-CM

## 2019-12-10 MED ORDER — PENICILLIN G BENZATHINE 2400000 UNIT/4ML IM SUSP
2.4000 10*6.[IU] | Freq: Once | INTRAMUSCULAR | Status: AC
  Administered 2019-12-10: 2400000 [IU] via INTRAMUSCULAR

## 2019-12-10 NOTE — Progress Notes (Signed)
Subjective:  Patient ID: Caleb Obrien, male    DOB: 1965-08-06  Age: 54 y.o. MRN: [id number]  CC: Follow-up  This visit occurred during the SARS-CoV-2 public health emergency.  Safety protocols were in place, including screening questions prior to the visit, additional usage of staff PPE, and extensive cleaning of exam room while observing appropriate contact time as indicated for disinfecting solutions.    HPI Caleb Obrien presents for f/up - His recent STI screening revealed a positive RPR but a negative FTA-ABS.  He is at risk.  He denies symptoms related to this (no denies rash, sore throat, rectal discomfort, penile lesions, lymphadenopathy, fatigue, night sweats, fever, or chills).  Outpatient Medications Prior to Visit  Medication Sig Dispense Refill  . albuterol (VENTOLIN HFA) 108 (90 Base) MCG/ACT inhaler Inhale 1-2 puffs into the lungs every 6 (six) hours as needed for wheezing or shortness of breath. 18 g 5  . ALPRAZolam (XANAX) 0.25 MG tablet TAKE 1/2 TABLET BY MOUTH AS NEEDED 30 tablet 3  . atorvastatin (LIPITOR) 40 MG tablet TAKE ONE TABLET BY MOUTH DAILY 90 tablet 1  . buPROPion (WELLBUTRIN SR) 100 MG 12 hr tablet TAKE ONE TABLET BY MOUTH TWICE A DAY 180 tablet 0  . carvedilol (COREG) 3.125 MG tablet TAKE ONE TABLET BY MOUTH TWICE A DAY WITH A MEAL 180 tablet 1  . cetirizine (ZYRTEC) 10 MG tablet TAKE 1 TABLET(10 MG) BY MOUTH DAILY 30 tablet 11  . Cholecalciferol 2000 units TABS Take 1 tablet (2,000 Units total) by mouth daily. 90 tablet 1  . famotidine (PEPCID) 20 MG tablet Take 20 mg by mouth daily.    . fluticasone furoate-vilanterol (BREO ELLIPTA) 100-25 MCG/INH AEPB Inhale 1 puff into the lungs daily. 120 each 1  . irbesartan-hydrochlorothiazide (AVALIDE) 300-12.5 MG tablet Take 1 tablet by mouth daily. 90 tablet 1  . tadalafil (CIALIS) 20 MG tablet Take 1 tablet (20 mg total) by mouth daily as needed for erectile dysfunction. 10 tablet 5   No  facility-administered medications prior to visit.    ROS Review of Systems  Constitutional: Negative for chills, fatigue and fever.  HENT: Negative.  Negative for sore throat and trouble swallowing.   Eyes: Negative.   Respiratory: Negative for cough, chest tightness, shortness of breath and wheezing.   Cardiovascular: Negative for chest pain, palpitations and leg swelling.  Gastrointestinal: Negative for abdominal pain, constipation, diarrhea, nausea and vomiting.  Endocrine: Negative.   Genitourinary: Negative.  Negative for difficulty urinating, discharge, genital sores, hematuria and testicular pain.  Musculoskeletal: Negative for arthralgias.  Skin: Negative.  Negative for color change and rash.  Neurological: Negative.  Negative for dizziness, weakness and light-headedness.  Hematological: Negative for adenopathy. Does not bruise/bleed easily.  Psychiatric/Behavioral: Negative.     Objective:  BP 136/82   Pulse 72   Temp 98.1 F (36.7 C) (Oral)   Resp 16   Wt 168 lb (76.2 kg)   SpO2 98%   BMI 25.54 kg/m   BP Readings from Last 3 Encounters:  12/11/19 136/82  12/05/19 138/88  08/15/19 134/86    Wt Readings from Last 3 Encounters:  12/11/19 168 lb (76.2 kg)  12/05/19 167 lb 2 oz (75.8 kg)  08/15/19 163 lb 12.8 oz (74.3 kg)    Physical Exam Vitals reviewed.  Constitutional:      Appearance: Normal appearance.  HENT:     Nose: Nose normal.     Mouth/Throat:     Mouth: Mucous membranes  are moist.  Eyes:     General: No scleral icterus.    Conjunctiva/sclera: Conjunctivae normal.  Cardiovascular:     Rate and Rhythm: Normal rate and regular rhythm.     Heart sounds: No murmur heard.   Pulmonary:     Effort: Pulmonary effort is normal.     Breath sounds: No stridor. No wheezing, rhonchi or rales.  Abdominal:     General: Abdomen is flat.     Palpations: There is no mass.     Tenderness: There is no abdominal tenderness.  Musculoskeletal:         General: Normal range of motion.     Cervical back: Neck supple.     Right lower leg: No edema.     Left lower leg: No edema.  Lymphadenopathy:     Cervical: No cervical adenopathy.  Skin:    General: Skin is warm and dry.     Coloration: Skin is not pale.  Neurological:     General: No focal deficit present.     Mental Status: He is alert.     Lab Results  Component Value Date   WBC 6.2 12/05/2019   HGB 14.1 12/05/2019   HCT 41.2 12/05/2019   PLT 293.0 12/05/2019   GLUCOSE 97 12/05/2019   CHOL 136 12/05/2019   TRIG 55.0 12/05/2019   HDL 52.20 12/05/2019   LDLDIRECT 144.4 01/27/2012   LDLCALC 73 12/05/2019   ALT 14 06/03/2019   AST 15 06/03/2019   NA 137 12/05/2019   K 4.2 12/05/2019   CL 101 12/05/2019   CREATININE 0.96 12/05/2019   BUN 16 12/05/2019   CO2 29 12/05/2019   TSH 4.19 12/05/2019   PSA 1.08 06/03/2019    DG Ribs Unilateral W/Chest Left  Result Date: 04/06/2016 CLINICAL DATA:  Fall 4 days ago with left-sided chest pain EXAM: LEFT RIBS AND CHEST - 3+ VIEW COMPARISON:  01/24/2014 FINDINGS: Cardiac shadow is within normal limits. The lungs are clear bilaterally. No pneumothorax or sizable effusion is noted. Minimal irregularity of the anterior aspect of the left sixth rib is noted which may represent an undisplaced fracture. No other fractures are noted. IMPRESSION: Irregularity of the anterior aspect of the left sixth rib which may represent an undisplaced fracture. Electronically Signed   By: Inez Catalina M.D.   On: 04/06/2016 15:14    Assessment & Plan:   Caleb Obrien was seen today for follow-up.  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  His RPR was positive at 1:2.  The FTA-ABS was negative.  This could be a positive RPR but after discussion with him he wants to go ahead and be treated.  He was given benzathine penicillin 2,400,000 units IM.  He will return in about 4 weeks to recheck the FTA-ABS.  He agrees to notify his  sexual contacts. -     Penicillin G Benzathine SUSP 2,400,000 Units   I am having Caleb Obrien "Caleb Obrien" maintain his cetirizine, Cholecalciferol, famotidine, ALPRAZolam, carvedilol, atorvastatin, buPROPion, tadalafil, Breo Ellipta, albuterol, and irbesartan-hydrochlorothiazide. We administered Penicillin G Benzathine.   Meds ordered this encounter  Medications  . Penicillin G Benzathine SUSP 2,400,000 Units     Follow-up: Return in about 4 weeks (around 01/07/2020).  Scarlette Calico, MD

## 2019-12-10 NOTE — Patient Instructions (Signed)
Syphilis Syphilis is an infection that can spread through sexual contact. The infection can cause serious complications, so it is important to get treatment right away. There are four stages of syphilis:  Primary stage. During this stage sores may form where the disease entered your body.  Secondary stage. During this stage skin rashes and lesions will form.  Latent stage. During this stage there are no symptoms, but the infection may still be contagious.  Tertiary stage. This stage happens 10-30 years after the infection starts. During this stage, the disease damages organs and can lead to death. Most people do not develop this stage of syphilis. What are the causes? This condition is caused by bacteria called Treponema pallidum. The condition can spread during sexual activity, such as during oral, anal, or vaginal sex. It can also be spread from mother to fetus during pregnancy. What increases the risk? You are more likely to develop this condition if:  You do not use a condom.  You have or had another sexually transmitted infection (STI).  You have multiple sex partners.  You use illegal drugs through an IV. What are the signs or symptoms? Symptoms of this condition depend on the stage of the disease. Primary stage  Painless sores (chancres) in and around the genital organs, mouth, or hands. The sores are usually firm and round. Secondary stage  A rash or sores. The rash usually does not itch.  A fever.  A headache.  A sore throat.  Swollen lymph nodes.  New sores in the mouth or on the genitals.  A feeling of being ill.  Pain in the joints.  Patchy hair loss.  Weight loss.  Fatigue. Latent stage There are no symptoms during this stage. Tertiary stage  Dementia.  Personality and mood changes.  Difficulty walking and coordinating movements.  Muscle weakness or paralysis.  Problems with coordination.  Heart failure.  Trouble  breathing.  Fainting.  Soft, rubbery growths on the skin, bones, or liver (gummas).  Sudden "lightning" pains, numbness, or tingling.  Vision changes.  Hearing changes.  Trouble controlling your urine and bowel movements. How is this diagnosed? This condition is diagnosed with:  A physical exam.  Blood tests.  Tests of the fluid (drainage) from a sore or rash.  Tests of the fluid around the spine (lumbar puncture). These tests are done to check for an infection in the brain or nervous system.  Imaging tests. These may be done to check for damage to the heart, aorta, or brain if the condition is in the tertiary stage. Tests may include: ? An X-ray. ? A CT scan. ? An MRI. ? An echocardiogram. This test takes a picture of the heart. ? An ultrasound. How is this treated? This condition can be cured with antibiotic medicine. During the first day of treatment, the medicine may cause you to experience fever, chills, headache, nausea, or aching all over your body. This is normal and should go away within 24 hours. Follow these instructions at home: Medicines   Take over-the-counter and prescription medicines only as told by your health care provider.  Take your antibiotic medicine as told by your health care provider. Do not stop taking the antibiotic even if you start to feel better. Incomplete treatment will put you at risk for continued infection and could be life threatening. General instructions  Do not have sex until your treatment is completed, or as directed by your health care provider.  Tell your recent sexual partners that you  were diagnosed with syphilis. It is important that they get treatment, even if they do not have symptoms.  Keep all follow-up visits as told by your health care provider. This is important. How is this prevented?  Use latex condoms correctly whenever you have sex.  Before you have sex, ask your partner if he or she has been tested for STIs.  Ask about the test results.  Avoid having multiple sexual partners. Contact a health care provider if:  You continue to have any of the following symptoms 24 hours after beginning treatment: ? Fever. ? Chills. ? Headache. ? Nausea. ? Aching all over your body.  Your symptoms do not improve, even with treatment. Get help right away if:  You have severe chest pain.  You have trouble walking or coordinating movements.  You are confused.  You lose vision or hearing.  You have numbness in your arms or legs.  You have a seizure.  You faint.  You have a severe headache that does not go away with medicine. Summary  Syphilis is an infection that can spread through sexual contact.  This condition can cause serious complications, so it is best to get treatment right away. The condition can be cured with antibiotic medicine.  This condition can also be spread from mother to fetus during pregnancy.  Take your antibiotic medicine as told by your health care provider.  Tell your recent sexual partners that you were diagnosed with syphilis. It is important that they get treatment, even if they do not have symptoms. This information is not intended to replace advice given to you by your health care provider. Make sure you discuss any questions you have with your health care provider. Document Revised: 12/11/2018 Document Reviewed: 07/26/2016 Elsevier Patient Education  [place].

## 2019-12-11 ENCOUNTER — Ambulatory Visit: Payer: BC Managed Care – PPO | Admitting: Internal Medicine

## 2020-01-02 ENCOUNTER — Other Ambulatory Visit: Payer: Self-pay | Admitting: Internal Medicine

## 2020-01-02 ENCOUNTER — Encounter: Payer: Self-pay | Admitting: Internal Medicine

## 2020-01-02 DIAGNOSIS — F418 Other specified anxiety disorders: Secondary | ICD-10-CM

## 2020-01-02 MED ORDER — ALPRAZOLAM 0.25 MG PO TABS: ORAL_TABLET | ORAL | 3 refills | Status: DC

## 2020-01-02 NOTE — Telephone Encounter (Signed)
Check [place] registry last filled 11/08/2018.Marland KitchenJohny Chess

## 2020-01-09 ENCOUNTER — Encounter: Payer: Self-pay | Admitting: Internal Medicine

## 2020-01-09 ENCOUNTER — Ambulatory Visit: Payer: BC Managed Care – PPO | Admitting: Internal Medicine

## 2020-01-09 ENCOUNTER — Other Ambulatory Visit: Payer: Self-pay

## 2020-01-09 VITALS — BP 134/86 | HR 84 | Temp 98.4°F | Resp 16 | Ht 68.0 in | Wt 167.4 lb

## 2020-01-09 DIAGNOSIS — A510 Primary genital syphilis: Secondary | ICD-10-CM | POA: Diagnosis not present

## 2020-01-09 DIAGNOSIS — R0681 Apnea, not elsewhere classified: Secondary | ICD-10-CM | POA: Diagnosis not present

## 2020-01-09 DIAGNOSIS — I10 Essential (primary) hypertension: Secondary | ICD-10-CM

## 2020-01-09 DIAGNOSIS — K219 Gastro-esophageal reflux disease without esophagitis: Secondary | ICD-10-CM | POA: Diagnosis not present

## 2020-01-09 MED ORDER — DEXLANSOPRAZOLE 60 MG PO CPDR: 60.0000 mg | DELAYED_RELEASE_CAPSULE | Freq: Every day | ORAL | 1 refills | Status: DC

## 2020-01-09 NOTE — Progress Notes (Signed)
Subjective:  Patient ID: Caleb Obrien, male    DOB: 20-Aug-1965  Age: 54 y.o. MRN: [id number]  CC: Hypertension and Gastroesophageal Reflux  This visit occurred during the SARS-CoV-2 public health emergency.  Safety protocols were in place, including screening questions prior to the visit, additional usage of staff PPE, and extensive cleaning of exam room while observing appropriate contact time as indicated for disinfecting solutions.    HPI Caleb Obrien presents for f/up - About a month ago he was screened for STIs and was found to have a positive RPR.  The FTA-ABS was negative.  He considers himself to be high risk and so I went ahead and treated him with Bicillin LA.  He tells me his current sexual partner also had a positive RPR.    He complains of heartburn that is most prominent at night.  He denies odynophagia or dysphagia.  He has not gotten much symptom relief with Tums and famotidine.  Outpatient Medications Prior to Visit  Medication Sig Dispense Refill  . albuterol (VENTOLIN HFA) 108 (90 Base) MCG/ACT inhaler Inhale 1-2 puffs into the lungs every 6 (six) hours as needed for wheezing or shortness of breath. 18 g 5  . ALPRAZolam (XANAX) 0.25 MG tablet TAKE 1/2 TABLET BY MOUTH AS NEEDED 30 tablet 3  . atorvastatin (LIPITOR) 40 MG tablet TAKE ONE TABLET BY MOUTH DAILY 90 tablet 1  . buPROPion (WELLBUTRIN SR) 100 MG 12 hr tablet TAKE ONE TABLET BY MOUTH TWICE A DAY 180 tablet 0  . carvedilol (COREG) 3.125 MG tablet TAKE ONE TABLET BY MOUTH TWICE A DAY WITH A MEAL 180 tablet 1  . cetirizine (ZYRTEC) 10 MG tablet TAKE 1 TABLET(10 MG) BY MOUTH DAILY 30 tablet 11  . Cholecalciferol 2000 units TABS Take 1 tablet (2,000 Units total) by mouth daily. 90 tablet 1  . famotidine (PEPCID) 20 MG tablet Take 20 mg by mouth daily.    . fluticasone furoate-vilanterol (BREO ELLIPTA) 100-25 MCG/INH AEPB Inhale 1 puff into the lungs daily. 120 each 1  . irbesartan-hydrochlorothiazide  (AVALIDE) 300-12.5 MG tablet Take 1 tablet by mouth daily. 90 tablet 1  . tadalafil (CIALIS) 20 MG tablet Take 1 tablet (20 mg total) by mouth daily as needed for erectile dysfunction. 10 tablet 5   No facility-administered medications prior to visit.    ROS Review of Systems  Constitutional: Negative for appetite change, diaphoresis, fatigue and unexpected weight change.  HENT: Negative for mouth sores, trouble swallowing and voice change.   Eyes: Negative.   Respiratory: Negative for cough, chest tightness, shortness of breath and wheezing.   Cardiovascular: Negative for chest pain, palpitations and leg swelling.  Gastrointestinal: Negative for abdominal pain, constipation, diarrhea, nausea and vomiting.  Endocrine: Negative.   Genitourinary: Negative.  Negative for difficulty urinating, discharge, dysuria, genital sores, scrotal swelling and testicular pain.  Musculoskeletal: Negative.  Negative for arthralgias and myalgias.  Skin: Negative for color change and rash.  Neurological: Negative.  Negative for dizziness and weakness.  Hematological: Negative for adenopathy. Does not bruise/bleed easily.  Psychiatric/Behavioral: Negative.     Objective:  BP (!) 134/86 (BP Location: Left Arm, Patient Position: Sitting, Cuff Size: Normal)   Pulse 84   Temp 98.4 F (36.9 C) (Oral)   Resp 16   Ht 5\' 8"  (1.727 m)   Wt 167 lb 6 oz (75.9 kg)   SpO2 98%   BMI 25.45 kg/m   BP Readings from Last 3 Encounters:  01/09/20 Marland Kitchen)  134/86  12/11/19 136/82  12/05/19 138/88    Wt Readings from Last 3 Encounters:  01/09/20 167 lb 6 oz (75.9 kg)  12/11/19 168 lb (76.2 kg)  12/05/19 167 lb 2 oz (75.8 kg)    Physical Exam Vitals reviewed.  Constitutional:      Appearance: Normal appearance.  HENT:     Nose: Nose normal.     Mouth/Throat:     Mouth: Mucous membranes are moist.  Eyes:     General: No scleral icterus.    Conjunctiva/sclera: Conjunctivae normal.  Cardiovascular:     Rate  and Rhythm: Normal rate and regular rhythm.     Heart sounds: No murmur heard.   Pulmonary:     Effort: Pulmonary effort is normal.     Breath sounds: No stridor. No wheezing, rhonchi or rales.  Abdominal:     General: Abdomen is flat.     Palpations: There is no mass.     Tenderness: There is no abdominal tenderness. There is no guarding.  Musculoskeletal:        General: Normal range of motion.     Cervical back: Neck supple.     Right lower leg: No edema.     Left lower leg: No edema.  Lymphadenopathy:     Cervical: No cervical adenopathy.  Skin:    General: Skin is warm and dry.     Findings: No rash.  Neurological:     General: No focal deficit present.     Mental Status: He is alert.  Psychiatric:        Mood and Affect: Mood normal.        Behavior: Behavior normal.     Lab Results  Component Value Date   WBC 6.2 12/05/2019   HGB 14.1 12/05/2019   HCT 41.2 12/05/2019   PLT 293.0 12/05/2019   GLUCOSE 97 12/05/2019   CHOL 136 12/05/2019   TRIG 55.0 12/05/2019   HDL 52.20 12/05/2019   LDLDIRECT 144.4 01/27/2012   LDLCALC 73 12/05/2019   ALT 14 06/03/2019   AST 15 06/03/2019   NA 137 12/05/2019   K 4.2 12/05/2019   CL 101 12/05/2019   CREATININE 0.96 12/05/2019   BUN 16 12/05/2019   CO2 29 12/05/2019   TSH 2.99 01/09/2020   PSA 1.08 06/03/2019    DG Ribs Unilateral W/Chest Left  Result Date: 04/06/2016 CLINICAL DATA:  Fall 4 days ago with left-sided chest pain EXAM: LEFT RIBS AND CHEST - 3+ VIEW COMPARISON:  01/24/2014 FINDINGS: Cardiac shadow is within normal limits. The lungs are clear bilaterally. No pneumothorax or sizable effusion is noted. Minimal irregularity of the anterior aspect of the left sixth rib is noted which may represent an undisplaced fracture. No other fractures are noted. IMPRESSION: Irregularity of the anterior aspect of the left sixth rib which may represent an undisplaced fracture. Electronically Signed   By: Inez Catalina M.D.   On:  04/06/2016 15:14    Assessment & Plan:   [name] was seen today for hypertension and gastroesophageal reflux.  Diagnoses and all orders for this visit:  GERD with apnea- He has a lot of nocturnal heartburn so I think Dexilant would be a great option for him. -     dexlansoprazole (DEXILANT) 60 MG capsule; Take 1 capsule (60 mg total) by mouth daily.  Essential hypertension, benign- His blood pressure is adequately well controlled. -     TSH; Future -     TSH  Primary syphilis-  His RPR is negative now so either the prior RPR was a false positive or the infection has been successfully eradicated. -     RPR; Future -     RPR   I am having Caleb Obrien "[name]" start on dexlansoprazole. I am also having him maintain his cetirizine, Cholecalciferol, famotidine, carvedilol, atorvastatin, buPROPion, tadalafil, Breo Ellipta, albuterol, irbesartan-hydrochlorothiazide, and ALPRAZolam.  Meds ordered this encounter  Medications  . dexlansoprazole (DEXILANT) 60 MG capsule    Sig: Take 1 capsule (60 mg total) by mouth daily.    Dispense:  90 capsule    Refill:  1     Follow-up: Return in about 3 months (around 04/10/2020).  Scarlette Calico, MD

## 2020-01-09 NOTE — Patient Instructions (Signed)
Gastroesophageal Reflux Disease, Adult Gastroesophageal reflux (GER) happens when acid from the stomach flows up into the tube that connects the mouth and the stomach (esophagus). Normally, food travels down the esophagus and stays in the stomach to be digested. However, when a person has GER, food and stomach acid sometimes move back up into the esophagus. If this becomes a more serious problem, the person may be diagnosed with a disease called gastroesophageal reflux disease (GERD). GERD occurs when the reflux:  Happens often.  Causes frequent or severe symptoms.  Causes problems such as damage to the esophagus. When stomach acid comes in contact with the esophagus, the acid may cause soreness (inflammation) in the esophagus. Over time, GERD may create small holes (ulcers) in the lining of the esophagus. What are the causes? This condition is caused by a problem with the muscle between the esophagus and the stomach (lower esophageal sphincter, or LES). Normally, the LES muscle closes after food passes through the esophagus to the stomach. When the LES is weakened or abnormal, it does not close properly, and that allows food and stomach acid to go back up into the esophagus. The LES can be weakened by certain dietary substances, medicines, and medical conditions, including:  Tobacco use.  Pregnancy.  Having a hiatal hernia.  Alcohol use.  Certain foods and beverages, such as coffee, chocolate, onions, and peppermint. What increases the risk? You are more likely to develop this condition if you:  Have an increased body weight.  Have a connective tissue disorder.  Use NSAID medicines. What are the signs or symptoms? Symptoms of this condition include:  Heartburn.  Difficult or painful swallowing.  The feeling of having a lump in the throat.  Abitter taste in the mouth.  Bad breath.  Having a large amount of saliva.  Having an upset or bloated  stomach.  Belching.  Chest pain. Different conditions can cause chest pain. Make sure you see your health care provider if you experience chest pain.  Shortness of breath or wheezing.  Ongoing (chronic) cough or a night-time cough.  Wearing away of tooth enamel.  Weight loss. How is this diagnosed? Your health care provider will take a medical history and perform a physical exam. To determine if you have mild or severe GERD, your health care provider may also monitor how you respond to treatment. You may also have tests, including:  A test to examine your stomach and esophagus with a small camera (endoscopy).  A test thatmeasures the acidity level in your esophagus.  A test thatmeasures how much pressure is on your esophagus.  A barium swallow or modified barium swallow test to show the shape, size, and functioning of your esophagus. How is this treated? The goal of treatment is to help relieve your symptoms and to prevent complications. Treatment for this condition may vary depending on how severe your symptoms are. Your health care provider may recommend:  Changes to your diet.  Medicine.  Surgery. Follow these instructions at home: Eating and drinking   Follow a diet as recommended by your health care provider. This may involve avoiding foods and drinks such as: ? Coffee and tea (with or without caffeine). ? Drinks that containalcohol. ? Energy drinks and sports drinks. ? Carbonated drinks or sodas. ? Chocolate and cocoa. ? Peppermint and mint flavorings. ? Garlic and onions. ? Horseradish. ? Spicy and acidic foods, including peppers, chili powder, curry powder, vinegar, hot sauces, and barbecue sauce. ? Citrus fruit juices and citrus   fruits, such as oranges, lemons, and limes. ? Tomato-based foods, such as red sauce, chili, salsa, and pizza with red sauce. ? Fried and fatty foods, such as donuts, french fries, potato chips, and high-fat dressings. ? High-fat  meats, such as hot dogs and fatty cuts of red and white meats, such as rib eye steak, sausage, ham, and bacon. ? High-fat dairy items, such as whole milk, butter, and cream cheese.  Eat small, frequent meals instead of large meals.  Avoid drinking large amounts of liquid with your meals.  Avoid eating meals during the 2-3 hours before bedtime.  Avoid lying down right after you eat.  Do not exercise right after you eat. Lifestyle   Do not use any products that contain nicotine or tobacco, such as cigarettes, e-cigarettes, and chewing tobacco. If you need help quitting, ask your health care provider.  Try to reduce your stress by using methods such as yoga or meditation. If you need help reducing stress, ask your health care provider.  If you are overweight, reduce your weight to an amount that is healthy for you. Ask your health care provider for guidance about a safe weight loss goal. General instructions  Pay attention to any changes in your symptoms.  Take over-the-counter and prescription medicines only as told by your health care provider. Do not take aspirin, ibuprofen, or other NSAIDs unless your health care provider told you to do so.  Wear loose-fitting clothing. Do not wear anything tight around your waist that causes pressure on your abdomen.  Raise (elevate) the head of your bed about 6 inches (15 cm).  Avoid bending over if this makes your symptoms worse.  Keep all follow-up visits as told by your health care provider. This is important. Contact a health care provider if:  You have: ? New symptoms. ? Unexplained weight loss. ? Difficulty swallowing or it hurts to swallow. ? Wheezing or a persistent cough. ? A hoarse voice.  Your symptoms do not improve with treatment. Get help right away if you:  Have pain in your arms, neck, jaw, teeth, or back.  Feel sweaty, dizzy, or light-headed.  Have chest pain or shortness of breath.  Vomit and your vomit looks  like blood or coffee grounds.  Faint.  Have stool that is bloody or black.  Cannot swallow, drink, or eat. Summary  Gastroesophageal reflux happens when acid from the stomach flows up into the esophagus. GERD is a disease in which the reflux happens often, causes frequent or severe symptoms, or causes problems such as damage to the esophagus.  Treatment for this condition may vary depending on how severe your symptoms are. Your health care provider may recommend diet and lifestyle changes, medicine, or surgery.  Contact a health care provider if you have new or worsening symptoms.  Take over-the-counter and prescription medicines only as told by your health care provider. Do not take aspirin, ibuprofen, or other NSAIDs unless your health care provider told you to do so.  Keep all follow-up visits as told by your health care provider. This is important. This information is not intended to replace advice given to you by your health care provider. Make sure you discuss any questions you have with your health care provider. Document Revised: 12/06/2017 Document Reviewed: 12/06/2017 Elsevier Patient Education  2020 [employer]  

## 2020-01-10 LAB — RPR: RPR Ser Ql: NONREACTIVE

## 2020-01-10 LAB — TSH: TSH: 2.99 mIU/L (ref 0.40–4.50)

## 2020-01-15 ENCOUNTER — Telehealth: Payer: Self-pay

## 2020-01-15 NOTE — Telephone Encounter (Signed)
Key: [id number]

## 2020-01-16 ENCOUNTER — Encounter: Payer: Self-pay | Admitting: Internal Medicine

## 2020-01-16 ENCOUNTER — Other Ambulatory Visit: Payer: Self-pay | Admitting: Internal Medicine

## 2020-01-16 DIAGNOSIS — K219 Gastro-esophageal reflux disease without esophagitis: Secondary | ICD-10-CM

## 2020-01-16 DIAGNOSIS — R0681 Apnea, not elsewhere classified: Secondary | ICD-10-CM

## 2020-01-16 MED ORDER — ESOMEPRAZOLE MAGNESIUM 40 MG PO CPDR: 40.0000 mg | DELAYED_RELEASE_CAPSULE | Freq: Every day | ORAL | 1 refills | Status: DC

## 2020-01-19 ENCOUNTER — Other Ambulatory Visit: Payer: Self-pay | Admitting: Internal Medicine

## 2020-01-19 DIAGNOSIS — F411 Generalized anxiety disorder: Secondary | ICD-10-CM

## 2020-01-31 DIAGNOSIS — G4733 Obstructive sleep apnea (adult) (pediatric): Secondary | ICD-10-CM | POA: Diagnosis not present

## 2020-03-08 ENCOUNTER — Other Ambulatory Visit: Payer: Self-pay | Admitting: Internal Medicine

## 2020-03-08 DIAGNOSIS — E785 Hyperlipidemia, unspecified: Secondary | ICD-10-CM

## 2020-03-17 DIAGNOSIS — G4733 Obstructive sleep apnea (adult) (pediatric): Secondary | ICD-10-CM | POA: Diagnosis not present

## 2020-04-08 ENCOUNTER — Other Ambulatory Visit: Payer: Self-pay | Admitting: Internal Medicine

## 2020-04-08 DIAGNOSIS — I10 Essential (primary) hypertension: Secondary | ICD-10-CM

## 2020-04-08 DIAGNOSIS — F411 Generalized anxiety disorder: Secondary | ICD-10-CM

## 2020-04-09 DIAGNOSIS — Z23 Encounter for immunization: Secondary | ICD-10-CM | POA: Diagnosis not present

## 2020-06-03 ENCOUNTER — Encounter: Payer: Self-pay | Admitting: Internal Medicine

## 2020-06-03 ENCOUNTER — Ambulatory Visit (INDEPENDENT_AMBULATORY_CARE_PROVIDER_SITE_OTHER): Payer: BC Managed Care – PPO

## 2020-06-03 ENCOUNTER — Other Ambulatory Visit: Payer: Self-pay

## 2020-06-03 ENCOUNTER — Ambulatory Visit (INDEPENDENT_AMBULATORY_CARE_PROVIDER_SITE_OTHER): Payer: BC Managed Care – PPO | Admitting: Internal Medicine

## 2020-06-03 VITALS — BP 126/82 | HR 68 | Temp 98.7°F | Resp 16 | Ht 68.0 in | Wt 166.0 lb

## 2020-06-03 DIAGNOSIS — E785 Hyperlipidemia, unspecified: Secondary | ICD-10-CM | POA: Diagnosis not present

## 2020-06-03 DIAGNOSIS — Z7252 High risk homosexual behavior: Secondary | ICD-10-CM

## 2020-06-03 DIAGNOSIS — I10 Essential (primary) hypertension: Secondary | ICD-10-CM

## 2020-06-03 DIAGNOSIS — E559 Vitamin D deficiency, unspecified: Secondary | ICD-10-CM

## 2020-06-03 DIAGNOSIS — E291 Testicular hypofunction: Secondary | ICD-10-CM | POA: Insufficient documentation

## 2020-06-03 DIAGNOSIS — A510 Primary genital syphilis: Secondary | ICD-10-CM | POA: Diagnosis not present

## 2020-06-03 DIAGNOSIS — Z125 Encounter for screening for malignant neoplasm of prostate: Secondary | ICD-10-CM | POA: Diagnosis not present

## 2020-06-03 DIAGNOSIS — G8929 Other chronic pain: Secondary | ICD-10-CM | POA: Insufficient documentation

## 2020-06-03 DIAGNOSIS — Z0001 Encounter for general adult medical examination with abnormal findings: Secondary | ICD-10-CM

## 2020-06-03 DIAGNOSIS — M25512 Pain in left shoulder: Secondary | ICD-10-CM

## 2020-06-03 DIAGNOSIS — E538 Deficiency of other specified B group vitamins: Secondary | ICD-10-CM | POA: Diagnosis not present

## 2020-06-03 DIAGNOSIS — G63 Polyneuropathy in diseases classified elsewhere: Secondary | ICD-10-CM

## 2020-06-03 LAB — HEPATIC FUNCTION PANEL
ALT: 27 U/L (ref 0–53)
AST: 20 U/L (ref 0–37)
Albumin: 4.6 g/dL (ref 3.5–5.2)
Alkaline Phosphatase: 88 U/L (ref 39–117)
Bilirubin, Direct: 0.2 mg/dL (ref 0.0–0.3)
Total Bilirubin: 0.8 mg/dL (ref 0.2–1.2)
Total Protein: 7.3 g/dL (ref 6.0–8.3)

## 2020-06-03 LAB — URINALYSIS, ROUTINE W REFLEX MICROSCOPIC
Bilirubin Urine: NEGATIVE
Hgb urine dipstick: NEGATIVE
Ketones, ur: NEGATIVE
Leukocytes,Ua: NEGATIVE
Nitrite: NEGATIVE
RBC / HPF: NONE SEEN (ref 0–?)
Specific Gravity, Urine: 1.02 (ref 1.000–1.030)
Total Protein, Urine: NEGATIVE
Urine Glucose: NEGATIVE
Urobilinogen, UA: 0.2 (ref 0.0–1.0)
WBC, UA: NONE SEEN (ref 0–?)
pH: 8 (ref 5.0–8.0)

## 2020-06-03 LAB — BASIC METABOLIC PANEL
BUN: 16 mg/dL (ref 6–23)
CO2: 33 mEq/L — ABNORMAL HIGH (ref 19–32)
Calcium: 9.7 mg/dL (ref 8.4–10.5)
Chloride: 101 mEq/L (ref 96–112)
Creatinine, Ser: 1 mg/dL (ref 0.40–1.50)
GFR: 86.39 mL/min (ref >60.00)
Glucose, Bld: 97 mg/dL (ref 70–99)
Potassium: 4.1 mEq/L (ref 3.5–5.1)
Sodium: 138 mEq/L (ref 135–145)

## 2020-06-03 LAB — LIPID PANEL
Cholesterol: 153 mg/dL (ref 0–200)
HDL: 49.3 mg/dL (ref >39.00)
LDL Cholesterol: 86 mg/dL (ref 0–99)
NonHDL: 104.13
Total CHOL/HDL Ratio: 3
Triglycerides: 91 mg/dL (ref 0.0–149.0)
VLDL: 18.2 mg/dL (ref 0.0–40.0)

## 2020-06-03 LAB — PSA: PSA: 1.19 ng/mL (ref 0.10–4.00)

## 2020-06-03 NOTE — Progress Notes (Signed)
Subjective:  Patient ID: Caleb Obrien, male    DOB: Sep 05, 1965  Age: 54 y.o. MRN: [id number]  CC: Annual Exam and Hypertension  This visit occurred during the SARS-CoV-2 public health emergency.  Safety protocols were in place, including screening questions prior to the visit, additional usage of staff PPE, and extensive cleaning of exam room while observing appropriate contact time as indicated for disinfecting solutions.    HPI Caleb Obrien presents for a CPX.  1. He complains of a several month history of left shoulder pain with slight decrease in range of motion. He denies any trauma or injury. He is getting adequate symptom relief with ibuprofen. None of his other joints bother him and he denies myalgias.  2. He continues to complain of low libido and erectile dysfunction.  3. He tells me his blood pressure has been well controlled. He is active and denies any recent episodes of chest pain, shortness of breath, dizziness, lightheadedness, palpitations, edema, or fatigue.  4.  He is in a monogamous relationship but continues to be concerned about STIs and wants to be tested.  Outpatient Medications Prior to Visit  Medication Sig Dispense Refill  . albuterol (VENTOLIN HFA) 108 (90 Base) MCG/ACT inhaler Inhale 1-2 puffs into the lungs every 6 (six) hours as needed for wheezing or shortness of breath. 18 g 5  . ALPRAZolam (XANAX) 0.25 MG tablet TAKE 1/2 TABLET BY MOUTH AS NEEDED 30 tablet 3  . atorvastatin (LIPITOR) 40 MG tablet TAKE ONE TABLET BY MOUTH DAILY 90 tablet 1  . buPROPion (WELLBUTRIN SR) 100 MG 12 hr tablet TAKE ONE TABLET BY MOUTH TWICE A DAY 180 tablet 0  . carvedilol (COREG) 3.125 MG tablet TAKE ONE TABLET BY MOUTH TWICE A DAY WITH MEALS 180 tablet 0  . cetirizine (ZYRTEC) 10 MG tablet TAKE 1 TABLET(10 MG) BY MOUTH DAILY 30 tablet 11  . Cholecalciferol 2000 units TABS Take 1 tablet (2,000 Units total) by mouth daily. 90 tablet 1  . esomeprazole (NEXIUM) 40 MG  capsule Take 1 capsule (40 mg total) by mouth daily. 90 capsule 1  . famotidine (PEPCID) 20 MG tablet Take 20 mg by mouth daily.    . irbesartan-hydrochlorothiazide (AVALIDE) 300-12.5 MG tablet TAKE ONE TABLET BY MOUTH DAILY 90 tablet 0  . tadalafil (CIALIS) 20 MG tablet Take 1 tablet (20 mg total) by mouth daily as needed for erectile dysfunction. 10 tablet 5  . fluticasone furoate-vilanterol (BREO ELLIPTA) 100-25 MCG/INH AEPB Inhale 1 puff into the lungs daily. 120 each 1   No facility-administered medications prior to visit.    ROS Review of Systems  Constitutional: Negative.  Negative for chills, diaphoresis, fatigue and fever.  HENT: Negative.  Negative for sore throat and trouble swallowing.   Respiratory: Negative for cough, chest tightness, shortness of breath and wheezing.   Cardiovascular: Negative for chest pain, palpitations and leg swelling.  Gastrointestinal: Negative for abdominal pain, constipation, diarrhea, nausea and vomiting.  Genitourinary: Negative for difficulty urinating, dysuria, frequency, genital sores, hematuria, penile discharge, penile pain, scrotal swelling, testicular pain and urgency.  Musculoskeletal: Positive for arthralgias. Negative for back pain, myalgias and neck pain.  Skin: Negative.  Negative for color change and rash.  Neurological: Negative.  Negative for dizziness, weakness, light-headedness, numbness and headaches.  Hematological: Negative for adenopathy. Does not bruise/bleed easily.  Psychiatric/Behavioral: Negative for behavioral problems, confusion, dysphoric mood, sleep disturbance and suicidal ideas. The patient is nervous/anxious.     Objective:  BP 126/82  Pulse 68   Temp 98.7 F (37.1 C) (Oral)   Resp 16   Ht 5\' 8"  (1.727 m)   Wt 166 lb (75.3 kg)   SpO2 99%   BMI 25.24 kg/m   BP Readings from Last 3 Encounters:  06/03/20 126/82  01/09/20 (!) 134/86  12/11/19 136/82    Wt Readings from Last 3 Encounters:  06/03/20 166  lb (75.3 kg)  01/09/20 167 lb 6 oz (75.9 kg)  12/11/19 168 lb (76.2 kg)    Physical Exam Vitals reviewed. Exam conducted with a chaperone present.  Constitutional:      Appearance: Normal appearance.  HENT:     Nose: Nose normal.     Mouth/Throat:     Mouth: Mucous membranes are moist.  Eyes:     General: No scleral icterus.    Conjunctiva/sclera: Conjunctivae normal.  Cardiovascular:     Rate and Rhythm: Normal rate and regular rhythm.     Heart sounds: No murmur heard.   Pulmonary:     Effort: Pulmonary effort is normal.     Breath sounds: No stridor. No wheezing, rhonchi or rales.  Abdominal:     General: Abdomen is flat.     Palpations: There is no mass.     Tenderness: There is no abdominal tenderness. There is no guarding.     Hernia: No hernia is present. There is no hernia in the left inguinal area or right inguinal area.  Genitourinary:    Pubic Area: No rash.      Penis: Normal and circumcised. No discharge, swelling or lesions.      Testes: Normal.        Right: Mass, tenderness or swelling not present.        Left: Mass, tenderness or swelling not present.     Epididymis:     Right: Normal. Not inflamed or enlarged.     Left: Normal. Not inflamed or enlarged.     Prostate: Normal. Not enlarged, not tender and no nodules present.     Rectum: Normal. Guaiac result negative. No mass, tenderness, anal fissure, external hemorrhoid or internal hemorrhoid. Normal anal tone.  Musculoskeletal:        General: Normal range of motion.     Right shoulder: Normal.     Left shoulder: Tenderness present. No swelling, deformity, bony tenderness or crepitus.     Cervical back: Neck supple.     Right lower leg: No edema.     Left lower leg: No edema.     Comments: Pain was elicited in the left posterior shoulder when he extended his arm and internally rotated it.  Lymphadenopathy:     Cervical: No cervical adenopathy.     Lower Body: No right inguinal adenopathy. No left  inguinal adenopathy.  Skin:    General: Skin is warm and dry.     Coloration: Skin is not pale.     Findings: No rash.  Neurological:     General: No focal deficit present.     Mental Status: He is alert and oriented to person, place, and time. Mental status is at baseline.  Psychiatric:        Mood and Affect: Mood normal.        Behavior: Behavior normal.     Lab Results  Component Value Date   WBC 6.2 12/05/2019   HGB 14.1 12/05/2019   HCT 41.2 12/05/2019   PLT 293.0 12/05/2019   GLUCOSE 97 06/03/2020   CHOL 153  06/03/2020   TRIG 91.0 06/03/2020   HDL 49.30 06/03/2020   LDLDIRECT 144.4 01/27/2012   LDLCALC 86 06/03/2020   ALT 27 06/03/2020   AST 20 06/03/2020   NA 138 06/03/2020   K 4.1 06/03/2020   CL 101 06/03/2020   CREATININE 1.00 06/03/2020   BUN 16 06/03/2020   CO2 33 (H) 06/03/2020   TSH 2.99 01/09/2020   PSA 1.19 06/03/2020    DG Ribs Unilateral W/Chest Left  Result Date: 04/06/2016 CLINICAL DATA:  Fall 4 days ago with left-sided chest pain EXAM: LEFT RIBS AND CHEST - 3+ VIEW COMPARISON:  01/24/2014 FINDINGS: Cardiac shadow is within normal limits. The lungs are clear bilaterally. No pneumothorax or sizable effusion is noted. Minimal irregularity of the anterior aspect of the left sixth rib is noted which may represent an undisplaced fracture. No other fractures are noted. IMPRESSION: Irregularity of the anterior aspect of the left sixth rib which may represent an undisplaced fracture. Electronically Signed   By: Inez Catalina M.D.   On: 04/06/2016 15:14   DG Shoulder Left  Result Date: 06/03/2020 CLINICAL DATA:  Pain EXAM: LEFT SHOULDER - 2+ VIEW COMPARISON:  None. FINDINGS: Oblique, Y scapular, and axillary images were obtained. There is no fracture or dislocation. The joint spaces appear normal. No erosive change or intra-articular calcification. Visualized left lung clear. IMPRESSION: No fracture or dislocation.  No appreciable arthropathy. Electronically  Signed   By: Lowella Grip III M.D.   On: 06/03/2020 13:54    Assessment & Plan:   Arden was seen today for annual exam and hypertension.  Diagnoses and all orders for this visit:  Encounter for general adult medical examination with abnormal findings- Exam completed, labs reviewed, vaccines reviewed and updated, cancer screenings are up-to-date, patient education was given. -     Lipid panel; Future -     PSA; Future -     HIV antibody (with reflex) -     Lipid panel -     PSA  Essential hypertension, benign- His blood pressure is adequately well controlled. -     Urinalysis, Routine w reflex microscopic; Future -     Basic metabolic panel; Future -     Basic metabolic panel -     Urinalysis, Routine w reflex microscopic  Vitamin D insufficiency  Hypogonadism in male- He is symptomatic with this so I recommended that he start using a testosterone replacement. -     Testosterone Total,Free,Bio, Males; Future -     Prolactin; Future -     Prolactin -     Testosterone Total,Free,Bio, Males  Chronic pain in left shoulder- Based on his symptoms, exam, and normal plain films I think he has a rotator cuff issue.  I recommended that he see sports medicine about this. -     DG Shoulder Left; Future -     Ambulatory referral to Sports Medicine  Vitamin B12 deficiency neuropathy ([place])- His recent B12 level was normal.  Primary syphilis- His RPR remains negative.  This has been adequately treated. -     RPR; Future -     RPR  High risk homosexual behavior- Screening for STIs is negative. -     C. trachomatis/N. gonorrhoeae RNA; Future -     C. trachomatis/N. gonorrhoeae RNA  Hyperlipidemia with target LDL less than 130- He has achieved his LDL goal and is doing well on the statin. -     Hepatic function panel; Future -  Hepatic function panel   I have discontinued Jeneen Rinks C. Heath "Chris"'s [employer]. I am also having him maintain his cetirizine, Cholecalciferol,  famotidine, tadalafil, albuterol, ALPRAZolam, esomeprazole, atorvastatin, carvedilol, buPROPion, and irbesartan-hydrochlorothiazide.  No orders of the defined types were placed in this encounter.  In addition to time spent on CPE, I spent 50 minutes in preparing to see the patient by review of recent labs, imaging and procedures, obtaining and reviewing separately obtained history, communicating with the patient and family or caregiver, ordering medications, tests or procedures, and documenting clinical information in the EHR including the differential Dx, treatment, and any further evaluation and other management of 1. Essential hypertension, benign 3. Vitamin D insufficiency 4. Hypogonadism in male 5. Chronic pain in left shoulder 6. Vitamin B12 deficiency neuropathy ([place]) 7. Primary syphilis 8. High risk homosexual behavior 9. Hyperlipidemia with target LDL less than 130     Follow-up: Return in about 6 months (around 12/02/2020).  Scarlette Calico, MD

## 2020-06-03 NOTE — Patient Instructions (Signed)

## 2020-06-04 LAB — RPR: RPR Ser Ql: NONREACTIVE

## 2020-06-04 LAB — TESTOSTERONE TOTAL,FREE,BIO, MALES
Albumin: 4.7 g/dL (ref 3.6–5.1)
Sex Hormone Binding: 33 nmol/L (ref 10–50)
Testosterone, Bioavailable: 124 ng/dL (ref >=110.0)
Testosterone, Free: 57.8 pg/mL (ref 46.0–224.0)
Testosterone: 442 ng/dL (ref 250–827)

## 2020-06-04 LAB — C. TRACHOMATIS/N. GONORRHOEAE RNA
C. trachomatis RNA, TMA: NOT DETECTED
N. gonorrhoeae RNA, TMA: NOT DETECTED

## 2020-06-04 LAB — HIV ANTIBODY (ROUTINE TESTING W REFLEX): HIV 1&2 Ab, 4th Generation: NONREACTIVE

## 2020-06-04 LAB — PROLACTIN: Prolactin: 4.4 ng/mL (ref 2.0–18.0)

## 2020-06-04 MED ORDER — TESTOSTERONE 30 MG/ACT TD SOLN: 2.0000 | Freq: Every day | TRANSDERMAL | 5 refills | Status: DC

## 2020-06-08 ENCOUNTER — Telehealth: Payer: Self-pay

## 2020-06-08 NOTE — Telephone Encounter (Signed)
Key: [id number]

## 2020-06-10 NOTE — Progress Notes (Signed)
Subjective:    I'm seeing this patient as a consultation for:  Dr. Yetta Barre. Note will be routed back to referring provider/PCP.  CC: L shoulder pain  I, Molly Weber, LAT, ATC, am serving as scribe for Dr. Clementeen Graham.  HPI: Pt is a 54 y/o male presenting w/ c/o L shoulder pain x eight months w/ no known MOI.  He locates his pain to his L post-lat shoulder.  Radiating pain: intermittently yes into L arm L shoulder mechanical symptoms: No Aggravating factors: propping/leaning on L arm when sitting; L shoulder AROM into flexion/scaption end-range Treatments tried: IBU  Diagnostic imaging: L shoulder XR- 06/03/20  Past medical history, Surgical history, Family history, Social history, Allergies, and medications have been entered into the medical record, reviewed.   Review of Systems: No new headache, visual changes, nausea, vomiting, diarrhea, constipation, dizziness, abdominal pain, skin rash, fevers, chills, night sweats, weight loss, swollen lymph nodes, body aches, joint swelling, muscle aches, chest pain, shortness of breath, mood changes, visual or auditory hallucinations.   Objective:    Vitals:   06/11/20 0950  BP: (!) 142/86  Pulse: (!) 104  SpO2: 97%   General: Well Developed, well nourished, and in no acute distress.  Neuro/Psych: Alert and oriented x3, extra-ocular muscles intact, able to move all 4 extremities, sensation grossly intact. Skin: Warm and dry, no rashes noted.  Respiratory: Not using accessory muscles, speaking in full sentences, trachea midline.  Cardiovascular: Pulses palpable, no extremity edema. Abdomen: Does not appear distended. MSK: C-spine normal-appearing normal motion. Left shoulder normal-appearing nontender normal motion some pain with abduction. Intact strength to internal rotation and abduction.  Some pain with abduction.  Diminished strength in pain 4/5 to external rotation. Positive Hawkins and Neer's test positive empty can  test. Negative Yergason's and speeds test.  Lab and Radiology Results DG Shoulder Left  Result Date: 06/03/2020 CLINICAL DATA:  Pain EXAM: LEFT SHOULDER - 2+ VIEW COMPARISON:  None. FINDINGS: Oblique, Y scapular, and axillary images were obtained. There is no fracture or dislocation. The joint spaces appear normal. No erosive change or intra-articular calcification. Visualized left lung clear. IMPRESSION: No fracture or dislocation.  No appreciable arthropathy. Electronically Signed   By: Bretta Bang III M.D.   On: 06/03/2020 13:54   I, Clementeen Graham, personally (independently) visualized and performed the interpretation of the images attached in this note.  Diagnostic Limited MSK Ultrasound of: Left shoulder Biceps tendon intact normal-appearing Subscapularis tendon is intact normal-appearing Supraspinatus tendon is intact with moderate increased subacromial bursa thickness. Infraspinatus tendon is intact. AC joint normal-appearing Impression: Subacromial bursitis   Impression and Recommendations:    Assessment and Plan: 54 y.o. male with left shoulder pain thought to be due to subacromial bursitis.  Possibly does have some rotator cuff tear not visible on ultrasound based on degree of pain and weakness with external rotation.  Regardless he is a great candidate for physical therapy.  Plan for home exercise program taught by ATC today and a formal referral to physical therapy.  Also recommend Voltaren gel.  Check back in 8 weeks.  Return sooner if needed.Marland Kitchen  PDMP not reviewed this encounter. Orders Placed This Encounter  Procedures  . [place] LIMITED JOINT SPACE STRUCTURES UP LEFT(NO LINKED CHARGES)    Order Specific Question:   Reason for Exam (SYMPTOM  OR DIAGNOSIS REQUIRED)    Answer:   L shoulder pain    Order Specific Question:   Preferred imaging location?    Answer:  [facility]  . Ambulatory referral to Physical Therapy    Referral Priority:   Routine     Referral Type:   Physical Medicine    Referral Reason:   Specialty Services Required    Requested Specialty:   Physical Therapy   No orders of the defined types were placed in this encounter.   Discussed warning signs or symptoms. Please see discharge instructions. Patient expresses understanding.   The above documentation has been reviewed and is accurate and complete Clementeen Graham, M.D.

## 2020-06-11 ENCOUNTER — Other Ambulatory Visit: Payer: Self-pay

## 2020-06-11 ENCOUNTER — Ambulatory Visit: Payer: Self-pay

## 2020-06-11 ENCOUNTER — Ambulatory Visit (INDEPENDENT_AMBULATORY_CARE_PROVIDER_SITE_OTHER): Payer: BC Managed Care – PPO | Admitting: Family Medicine

## 2020-06-11 ENCOUNTER — Encounter: Payer: Self-pay | Admitting: Family Medicine

## 2020-06-11 VITALS — BP 142/86 | HR 104 | Ht 68.0 in | Wt 169.0 lb

## 2020-06-11 DIAGNOSIS — M25512 Pain in left shoulder: Secondary | ICD-10-CM

## 2020-06-11 DIAGNOSIS — G8929 Other chronic pain: Secondary | ICD-10-CM

## 2020-06-11 NOTE — Patient Instructions (Addendum)
Thank you for coming in today.  Please perform the exercise program that we have prepared for you and gone over in detail on a daily basis.  In addition to the handout you were provided you can access your program through: [email or web address]   Your unique program code is:  [email or web address]   I've referred you to Physical Therapy.  Let us know if you don't hear from them in one week.  Please use voltaren gel up to 4x daily for pain as needed.   Recheck in about 8 weeks.  Let me know sooner if this is not working or if you have a problems.

## 2020-06-19 ENCOUNTER — Telehealth: Payer: Self-pay

## 2020-06-19 NOTE — Telephone Encounter (Signed)
Key: Asher Muir

## 2020-06-19 NOTE — Telephone Encounter (Signed)
PA denied.  Determination sent to scan.

## 2020-06-26 DIAGNOSIS — G4733 Obstructive sleep apnea (adult) (pediatric): Secondary | ICD-10-CM | POA: Diagnosis not present

## 2020-06-30 ENCOUNTER — Ambulatory Visit: Payer: BC Managed Care – PPO | Admitting: Physical Therapy

## 2020-07-03 ENCOUNTER — Ambulatory Visit: Payer: BC Managed Care – PPO | Admitting: Family Medicine

## 2020-07-07 ENCOUNTER — Other Ambulatory Visit: Payer: Self-pay | Admitting: Internal Medicine

## 2020-07-07 DIAGNOSIS — I10 Essential (primary) hypertension: Secondary | ICD-10-CM

## 2020-07-07 DIAGNOSIS — F411 Generalized anxiety disorder: Secondary | ICD-10-CM

## 2020-07-08 ENCOUNTER — Other Ambulatory Visit: Payer: Self-pay

## 2020-07-08 ENCOUNTER — Ambulatory Visit (INDEPENDENT_AMBULATORY_CARE_PROVIDER_SITE_OTHER): Payer: BC Managed Care – PPO | Admitting: Physical Therapy

## 2020-07-08 DIAGNOSIS — M25612 Stiffness of left shoulder, not elsewhere classified: Secondary | ICD-10-CM

## 2020-07-08 DIAGNOSIS — M25512 Pain in left shoulder: Secondary | ICD-10-CM | POA: Diagnosis not present

## 2020-07-08 DIAGNOSIS — G8929 Other chronic pain: Secondary | ICD-10-CM

## 2020-07-08 DIAGNOSIS — M6281 Muscle weakness (generalized): Secondary | ICD-10-CM

## 2020-07-08 NOTE — Therapy (Signed)
[facility] [address] [place], [place], [address] Phone: [phone]   Fax:  [phone]  Physical Therapy Evaluation  Patient Details  Name: Caleb Obrien MRN: [id number] Date of Birth: February 18, 1966 Referring Provider (PT): Marikay Alar Date: 07/08/2020   PT End of Session - 07/08/20 1230    Visit Number 1    Number of Visits 12    Date for PT Re-Evaluation 08/28/20    PT Start Time 0800    PT Stop Time 0843    PT Time Calculation (min) 43 min    Activity Tolerance Patient tolerated treatment well    Behavior During Therapy [facility] for tasks assessed/performed           Past Medical History:  Diagnosis Date  . ALLERGIC RHINITIS CAUSE UNSPECIFIED 02/24/2009  . Allergy   . ASTHMA 02/05/2007  . BENIGN PROSTATIC HYPERTROPHY, WITH URINARY OBSTRUCTION 05/22/2007  . Dermatophytosis of foot 11/02/2009  . GANGLION CYST, WRIST, LEFT 03/25/2009  . Hemangioma of skin and subcutaneous tissue 06/30/2009  . HEPATITIS B, HX OF 02/05/2007   per pt no history  . [place], [place] 04/21/2008  . PLANTAR WART, RIGHT 01/23/2009  . Sleep apnea    uses c-pap    Past Surgical History:  Procedure Laterality Date  . eyelid surgery  04/11/2018   [place]il  . WISDOM TOOTH EXTRACTION      There were no vitals filed for this visit.    Subjective Assessment - 07/08/20 0808    Subjective Pt arriving for PT evaluation for L shoulder pain that is ongoing for for about 1 year. Pt reporting pain with leaning on L arm which is his dominant side. Pt able to pin point pain on lateral shoulder.    Pertinent History asthma    Limitations Other (comment);Lifting;Sitting    Patient Stated Goals Stop hurting, be able to lift things without pain    Currently in Pain? Yes    Pain Score 3     Pain Location Shoulder    Pain Orientation Left    Pain Descriptors / Indicators Aching;Sore    Pain Type Chronic pain    Pain Onset More than a month ago    Pain  Frequency Intermittent    Aggravating Factors  leaning on left shoulder when sitting, reaching backward, lifting    Pain Relieving Factors changing position    Effect of Pain on Daily Activities see above              [facility] PT Assessment - 07/08/20 0001      Assessment   Medical Diagnosis L shoulder pain    Referring Provider (PT) Georgina Snell    Hand Dominance Left    Prior Therapy yes, for leg year ago      Precautions   Precautions None      Restrictions   Weight Bearing Restrictions No      Balance Screen   Has the patient fallen in the past 6 months No    Is the patient reluctant to leave their home because of a fear of falling?  No      Home Environment   Living Environment Private residence    Living Arrangements Alone      Prior Function   Level of Independence Independent      Cognition   Overall Cognitive Status Within Functional Limits for tasks assessed      Observation/Other Assessments   Focus on Therapeutic Outcomes (FOTO)  intake 76 % (  predicted 77%)      ROM / Strength   AROM / PROM / Strength AROM;Strength      AROM   AROM Assessment Site Shoulder    Right/Left Shoulder Right;Left    Right Shoulder Extension 70 Degrees    Right Shoulder Flexion 162 Degrees    Right Shoulder ABduction 174 Degrees    Right Shoulder Internal Rotation --   thumb to T6   Right Shoulder External Rotation 82 Degrees    Left Shoulder Extension 76 Degrees    Left Shoulder Flexion 165 Degrees   mild pain at end range   Left Shoulder ABduction 160 Degrees    Left Shoulder Internal Rotation --   thumb to T6   Left Shoulder External Rotation 65 Degrees      Strength   Strength Assessment Site Shoulder    Right/Left Shoulder Right;Left    Right Shoulder Flexion 5/5    Right Shoulder Extension 5/5    Right Shoulder ABduction 5/5    Right Shoulder Internal Rotation 5/5    Right Shoulder External Rotation 5/5    Left Shoulder Flexion 3/5    Left Shoulder ABduction 3/5     Left Shoulder Internal Rotation 3/5    Left Shoulder External Rotation 3/5      Palpation   Palpation comment TTP, anterior deltoid and supraspinatus tenson      Special Tests    Special Tests Rotator Cuff Impingement    Rotator Cuff Impingment tests Michel Bickers test;Empty Can test      Hawkins-Kennedy test   Findings Positive    Side Left      Empty Can test   Findings Positive    Side Left      Ambulation/Gait   Gait Comments decreased arm swing on the left                      Objective measurements completed on examination: See above findings.               PT Education - 07/08/20 1006    Education Details HEP, POC    Person(s) Educated Patient    Methods Explanation;Verbal cues;Handout    Comprehension Returned demonstration;Verbalized understanding            PT Short Term Goals - 07/08/20 1007      PT SHORT TERM GOAL #1   Title Patient will demonstrate independent use of home exercise program to maintain progress from in clinic treatments.    Time 3    Period Weeks    Status New    Target Date 07/29/20             PT Long Term Goals - 07/08/20 1007      PT LONG TERM GOAL #1   Title Patient will demonstrate/report pain at worst less than or equal to 2/10 to facilitate minimal limitation in daily activity secondary to pain symptoms.    Time 8    Period Weeks    Status New      PT LONG TERM GOAL #2   Title Patient will demonstrate independent use of home exercise program to facilitate ability to maintain/progress functional gains from skilled physical therapy services.    Time 8    Period Weeks    Status New      PT LONG TERM GOAL #3   Title Pt. will demonstrate FOTO outcome > or = 77 % to indicated reduced disability.  Time 8    Period Weeks    Status New    Target Date 08/28/20      PT LONG TERM GOAL #4   Title Pt. will demonstrate Lt shoulder AROM equal to Rt WFL s symptoms to facilitate usual overhead  reaching, self care/dressing at [facility] s limitation.    Time 8    Period Weeks    Status New    Target Date 08/28/20      PT LONG TERM GOAL #5   Title Pt. will demonstrate Lt UE MMT 5/5 throughout s symptoms to facilitate ability to perform usual lifting, carrying in daily household and recreational activity at PLOF.    Time 8    Period Weeks    Status New    Target Date 08/28/20                  Plan - 07/08/20 1014    Clinical Impression Statement Patient is a 55 y.o. male who comes to clinic with complaints of Lt shoulder/arm pain with mobility, strength deficits that impair his ability to perform usual daily and recreational functional activities without increase difficulty/symptoms at this time. Pt presenting with weakness in left shoulder of grossly 3/5. Pt with pin point tenderness over anterior deltoid insertion and supraspinatus tendon. Pt with postitive Hawkins/Kennedy test and +empty can test.   Patient to benefit from skilled PT services to address impairments and limitations to improve to previous level of function without restriction secondary to condition.    Personal Factors and Comorbidities Comorbidity 1    Comorbidities asthma    Examination-Activity Limitations Carry;Lift;Reach Overhead;Other;Sleep;Dressing    Examination-Participation Restrictions Community Activity;Other    Stability/Clinical Decision Making Stable/Uncomplicated    Clinical Decision Making Low    Rehab Potential Good    PT Frequency 1x / week    PT Duration 8 weeks    PT Treatment/Interventions ADLs/Self Care Home Management;Electrical Stimulation;Iontophoresis 4mg /ml Dexamethasone;Moist Heat;Traction;Balance training;Therapeutic exercise;Therapeutic activities;Functional mobility training;Stair training;Gait training;DME Instruction;Ultrasound;Neuromuscular re-education;Patient/family education;Manual techniques;Passive range of motion;Dry needling;Spinal Manipulations;Joint Manipulations;Taping     PT Next Visit Plan left shoulder ROM, mobs, strengthening as pt tolerates    PT Home Exercise Plan [email or web address]    Consulted and Agree with Plan of Care Patient           Patient will benefit from skilled therapeutic intervention in order to improve the following deficits and impairments:  Impaired flexibility,Decreased range of motion,Decreased mobility,Impaired perceived functional ability,Improper body mechanics,Impaired UE functional use,Pain,Decreased strength,Decreased activity tolerance,Decreased endurance,Hypomobility  Visit Diagnosis: Chronic left shoulder pain  Stiffness of left shoulder, not elsewhere classified  Muscle weakness (generalized)     Problem List Patient Active Problem List   Diagnosis Date Noted  . Encounter for general adult medical examination with abnormal findings 06/03/2020  . Hypogonadism in male 06/03/2020  . Chronic pain in left shoulder 06/03/2020  . Primary syphilis 12/10/2019  . STD exposure 05/18/2019  . Hemorrhoids 01/24/2019  . Vitamin D insufficiency 12/27/2017  . Depression with anxiety 08/22/2017  . Vitamin B12 deficiency neuropathy ([place]) 09/08/2016  . GERD with apnea 04/26/2016  . High risk homosexual behavior 04/12/2016  . Hyperlipidemia with target LDL less than 130 04/28/2015  . Drug-induced erectile dysfunction 04/27/2015  . Essential hypertension, benign 02/08/2014  . Mild persistent asthma 02/08/2014  . OSA (obstructive sleep apnea) 07/12/2013  . GAD (generalized anxiety disorder) 02/05/2007    Oretha Caprice , PT, MPT 07/08/2020, 12:59 PM  [facility] [id number]  [place], [place], [address] Phone: [phone]   Fax:  [phone]  Name: Caleb Obrien MRN: [id number] Date of Birth: 1965-11-09

## 2020-07-08 NOTE — Patient Instructions (Signed)
Access Code: [email or web address] URL: [email or web address] Date: 07/07/2020 Prepared by: Kearney Hard  Exercises Supine Bridge - 2 x daily - 7 x weekly - 2 sets - 10 reps - 5 seconds hold Supine Hip Adduction Isometric with Ball - 2 x daily - 7 x weekly - 2 sets - 10 reps - 5 seconds hold Supine Active Straight Leg Raise - 2 x daily - 7 x weekly - 2 sets - 10 reps Supine Lower Trunk Rotation - 2 x daily - 7 x weekly - 5 reps - 20 seconds hold Supine Piriformis Stretch with Foot on Ground - 2 x daily - 7 x weekly - 5 reps - 20 seconds hold

## 2020-07-21 ENCOUNTER — Encounter: Payer: BC Managed Care – PPO | Admitting: Physical Therapy

## 2020-07-28 ENCOUNTER — Ambulatory Visit (INDEPENDENT_AMBULATORY_CARE_PROVIDER_SITE_OTHER): Payer: BC Managed Care – PPO | Admitting: Rehabilitative and Restorative Service Providers"

## 2020-07-28 ENCOUNTER — Other Ambulatory Visit: Payer: Self-pay

## 2020-07-28 ENCOUNTER — Encounter: Payer: Self-pay | Admitting: Rehabilitative and Restorative Service Providers"

## 2020-07-28 DIAGNOSIS — G8929 Other chronic pain: Secondary | ICD-10-CM | POA: Diagnosis not present

## 2020-07-28 DIAGNOSIS — M6281 Muscle weakness (generalized): Secondary | ICD-10-CM | POA: Diagnosis not present

## 2020-07-28 DIAGNOSIS — M25512 Pain in left shoulder: Secondary | ICD-10-CM | POA: Diagnosis not present

## 2020-07-28 DIAGNOSIS — M25612 Stiffness of left shoulder, not elsewhere classified: Secondary | ICD-10-CM | POA: Diagnosis not present

## 2020-07-28 NOTE — Therapy (Addendum)
[facility] Physical Therapy [address] [place], [place], [address] Phone: [phone]   Fax:  [phone]  Physical Therapy Treatment/Discharge  Patient Details  Name: Caleb Obrien MRN: [id number] Date of Birth: 1965/09/29 Referring Provider (PT): Marikay Alar Date: 07/28/2020   PT End of Session - 07/28/20 0801    Visit Number 2    Number of Visits 12    Date for PT Re-Evaluation 08/28/20    PT Start Time 0800    PT Stop Time 0840    PT Time Calculation (min) 40 min    Activity Tolerance Patient tolerated treatment well    Behavior During Therapy [facility] for tasks assessed/performed           Past Medical History:  Diagnosis Date  . ALLERGIC RHINITIS CAUSE UNSPECIFIED 02/24/2009  . Allergy   . ASTHMA 02/05/2007  . BENIGN PROSTATIC HYPERTROPHY, WITH URINARY OBSTRUCTION 05/22/2007  . Dermatophytosis of foot 11/02/2009  . GANGLION CYST, WRIST, LEFT 03/25/2009  . Hemangioma of skin and subcutaneous tissue 06/30/2009  . HEPATITIS B, HX OF 02/05/2007   per pt no history  . [place], [place] 04/21/2008  . PLANTAR WART, RIGHT 01/23/2009  . Sleep apnea    uses c-pap    Past Surgical History:  Procedure Laterality Date  . eyelid surgery  04/11/2018   [place]  . WISDOM TOOTH EXTRACTION      There were no vitals filed for this visit.   Subjective Assessment - 07/28/20 0802    Subjective Pt. stated doing some good with exercise plan.  Pt. stated present but not as bad today.  Pt. stated coming 2x/week isn't possible.    Pertinent History asthma    Limitations Other (comment);Lifting;Sitting    Patient Stated Goals Stop hurting, be able to lift things without pain    Currently in Pain? Yes    Pain Score 2     Pain Orientation Left    Pain Descriptors / Indicators Aching;Sore    Pain Type Chronic pain    Pain Onset More than a month ago    Aggravating Factors  leaning on Lt arm                             [facility] Adult  PT Treatment/Exercise - 07/28/20 0001      Exercises   Exercises Shoulder;Other Exercises    Other Exercises  Review of existing HEP      Shoulder Exercises: Standing   Other Standing Exercises blue tband gh er/ir 3 x 10 each way bilateral blue band    Other Standing Exercises tband rows, gh ext 3 x 10 blue band      Manual Therapy   Manual therapy comments compression to Lt infraspinatus c ER movement                    PT Short Term Goals - 07/28/20 0831      PT SHORT TERM GOAL #1   Title Patient will demonstrate independent use of home exercise program to maintain progress from in clinic treatments.    Time 3    Period Weeks    Status Achieved    Target Date 07/29/20             PT Long Term Goals - 07/08/20 1007      PT LONG TERM GOAL #1   Title Patient will demonstrate/report pain at worst less than or equal  to 2/10 to facilitate minimal limitation in daily activity secondary to pain symptoms.    Time 8    Period Weeks    Status New      PT LONG TERM GOAL #2   Title Patient will demonstrate independent use of home exercise program to facilitate ability to maintain/progress functional gains from skilled physical therapy services.    Time 8    Period Weeks    Status New      PT LONG TERM GOAL #3   Title Pt. will demonstrate FOTO outcome > or = 77 % to indicated reduced disability.    Time 8    Period Weeks    Status New    Target Date 08/28/20      PT LONG TERM GOAL #4   Title Pt. will demonstrate Lt shoulder AROM equal to Rt WFL s symptoms to facilitate usual overhead reaching, self care/dressing at [facility] s limitation.    Time 8    Period Weeks    Status New    Target Date 08/28/20      PT LONG TERM GOAL #5   Title Pt. will demonstrate Lt UE MMT 5/5 throughout s symptoms to facilitate ability to perform usual lifting, carrying in daily household and recreational activity at PLOF.    Time 8    Period Weeks    Status New    Target Date 08/28/20                  Plan - 07/28/20 0825    Clinical Impression Statement AROM/PROM WFL at this time c mild discomfort in ER /IR end range, flexion end range but progressing c good knowledge of HEP.  Concordant symptom noted in Lt shoulder c infraspinatus TrP today.  Progressed HEP to include strengthening and eccentric tendon loading for shoulder c good results.    Personal Factors and Comorbidities Comorbidity 1    Comorbidities asthma    Examination-Activity Limitations Carry;Lift;Reach Overhead;Other;Sleep;Dressing    Examination-Participation Restrictions Community Activity;Other    Stability/Clinical Decision Making Stable/Uncomplicated    Rehab Potential Good    PT Frequency 1x / week    PT Duration 8 weeks    PT Treatment/Interventions ADLs/Self Care Home Management;Electrical Stimulation;Iontophoresis 72m/ml Dexamethasone;Moist Heat;Traction;Balance training;Therapeutic exercise;Therapeutic activities;Functional mobility training;Stair training;Gait training;DME Instruction;Ultrasound;Neuromuscular re-education;Patient/family education;Manual techniques;Passive range of motion;Dry needling;Spinal Manipulations;Joint Manipulations;Taping    PT Next Visit Plan Reassess trigger point, strength plan for progression    PT Home Exercise Plan [email or web address]    Consulted and Agree with Plan of Care Patient           Patient will benefit from skilled therapeutic intervention in order to improve the following deficits and impairments:  Impaired flexibility,Decreased range of motion,Decreased mobility,Impaired perceived functional ability,Improper body mechanics,Impaired UE functional use,Pain,Decreased strength,Decreased activity tolerance,Decreased endurance,Hypomobility  Visit Diagnosis: Chronic left shoulder pain  Stiffness of left shoulder, not elsewhere classified  Muscle weakness (generalized)     Problem List Patient Active Problem List   Diagnosis Date Noted  . Encounter for  general adult medical examination with abnormal findings 06/03/2020  . Hypogonadism in male 06/03/2020  . Chronic pain in left shoulder 06/03/2020  . Primary syphilis 12/10/2019  . STD exposure 05/18/2019  . Hemorrhoids 01/24/2019  . Vitamin D insufficiency 12/27/2017  . Depression with anxiety 08/22/2017  . Vitamin B12 deficiency neuropathy ([place] 09/08/2016  . GERD with apnea 04/26/2016  . High risk homosexual behavior 04/12/2016  . Hyperlipidemia with target LDL less than 130 04/28/2015  .  Drug-induced erectile dysfunction 04/27/2015  . Essential hypertension, benign 02/08/2014  . Mild persistent asthma 02/08/2014  . OSA (obstructive sleep apnea) 07/12/2013  . GAD (generalized anxiety disorder) 02/05/2007    Scot Jun, PT, DPT, OCS, ATC 07/28/20  8:33 AM  PHYSICAL THERAPY DISCHARGE SUMMARY  Visits from Start of Care: 2  Current functional level related to goals / functional outcomes: See note   Remaining deficits: See note   Education / Equipment: HEP Plan: Patient agrees to discharge.  Patient goals were partially met. Patient is being discharged due to not returning since the last visit.  ?????    Scot Jun, PT, DPT, OCS, ATC 09/16/20  2:58 PM     [facility] [address] [place], [place], [address] Phone: [phone]   Fax:  [phone]  Name: NORTON BIVINS MRN: [id number] Date of Birth: 1966-01-09

## 2020-07-28 NOTE — Patient Instructions (Signed)
Access Code: [email or web address] URL: [email or web address] Date: 07/28/2020 Prepared by: Scot Jun  Exercises Shoulder External Rotation with Anchored Resistance - 1 x daily - 7 x weekly - 3 sets - 10 reps Shoulder Internal Rotation with Resistance - 1 x daily - 7 x weekly - 3 sets - 10 reps Standing Shoulder Row with Anchored Resistance - 1 x daily - 7 x weekly - 3 sets - 10 reps Shoulder Extension with Resistance - 1 x daily - 7 x weekly - 3 sets - 10 reps

## 2020-08-05 ENCOUNTER — Ambulatory Visit: Payer: BC Managed Care – PPO | Admitting: Family Medicine

## 2020-08-06 ENCOUNTER — Ambulatory Visit: Payer: BC Managed Care – PPO | Admitting: Family Medicine

## 2020-08-07 ENCOUNTER — Encounter: Payer: BC Managed Care – PPO | Admitting: Rehabilitative and Restorative Service Providers"

## 2020-08-13 ENCOUNTER — Encounter: Payer: BC Managed Care – PPO | Admitting: Rehabilitative and Restorative Service Providers"

## 2020-08-19 ENCOUNTER — Ambulatory Visit: Payer: BC Managed Care – PPO | Admitting: Adult Health

## 2020-08-21 ENCOUNTER — Encounter: Payer: BC Managed Care – PPO | Admitting: Rehabilitative and Restorative Service Providers"

## 2020-08-24 ENCOUNTER — Other Ambulatory Visit: Payer: Self-pay | Admitting: Internal Medicine

## 2020-08-24 DIAGNOSIS — R0681 Apnea, not elsewhere classified: Secondary | ICD-10-CM

## 2020-08-24 DIAGNOSIS — K219 Gastro-esophageal reflux disease without esophagitis: Secondary | ICD-10-CM

## 2020-08-24 DIAGNOSIS — I10 Essential (primary) hypertension: Secondary | ICD-10-CM

## 2020-08-26 ENCOUNTER — Encounter: Payer: Self-pay | Admitting: Internal Medicine

## 2020-08-26 ENCOUNTER — Other Ambulatory Visit: Payer: Self-pay | Admitting: Internal Medicine

## 2020-08-26 DIAGNOSIS — R0681 Apnea, not elsewhere classified: Secondary | ICD-10-CM

## 2020-08-26 DIAGNOSIS — K219 Gastro-esophageal reflux disease without esophagitis: Secondary | ICD-10-CM

## 2020-08-26 MED ORDER — DEXLANSOPRAZOLE 60 MG PO CPDR: 60.0000 mg | DELAYED_RELEASE_CAPSULE | Freq: Every day | ORAL | 1 refills | Status: DC

## 2020-08-27 ENCOUNTER — Encounter: Payer: BC Managed Care – PPO | Admitting: Rehabilitative and Restorative Service Providers"

## 2020-08-29 ENCOUNTER — Other Ambulatory Visit: Payer: Self-pay | Admitting: Internal Medicine

## 2020-08-29 DIAGNOSIS — E785 Hyperlipidemia, unspecified: Secondary | ICD-10-CM

## 2020-09-03 ENCOUNTER — Encounter: Payer: BC Managed Care – PPO | Admitting: Rehabilitative and Restorative Service Providers"

## 2020-09-10 ENCOUNTER — Encounter: Payer: BC Managed Care – PPO | Admitting: Rehabilitative and Restorative Service Providers"

## 2020-10-04 ENCOUNTER — Other Ambulatory Visit: Payer: Self-pay | Admitting: Internal Medicine

## 2020-10-04 DIAGNOSIS — F411 Generalized anxiety disorder: Secondary | ICD-10-CM

## 2020-10-05 ENCOUNTER — Other Ambulatory Visit: Payer: Self-pay | Admitting: Internal Medicine

## 2020-10-05 DIAGNOSIS — F411 Generalized anxiety disorder: Secondary | ICD-10-CM

## 2020-10-05 DIAGNOSIS — I10 Essential (primary) hypertension: Secondary | ICD-10-CM

## 2020-10-06 ENCOUNTER — Encounter: Payer: Self-pay | Admitting: Internal Medicine

## 2020-10-11 ENCOUNTER — Encounter: Payer: Self-pay | Admitting: Internal Medicine

## 2020-10-12 ENCOUNTER — Other Ambulatory Visit: Payer: Self-pay | Admitting: Internal Medicine

## 2020-10-12 DIAGNOSIS — E291 Testicular hypofunction: Secondary | ICD-10-CM

## 2020-10-12 MED ORDER — TESTOSTERONE 20.25 MG/ACT (1.62%) TD GEL: 1.0000 | Freq: Every day | TRANSDERMAL | 0 refills | Status: DC

## 2020-10-14 ENCOUNTER — Encounter: Payer: Self-pay | Admitting: Internal Medicine

## 2020-10-15 ENCOUNTER — Other Ambulatory Visit: Payer: Self-pay | Admitting: Internal Medicine

## 2020-10-15 DIAGNOSIS — E291 Testicular hypofunction: Secondary | ICD-10-CM

## 2020-10-15 MED ORDER — TESTOSTERONE 20.25 MG/ACT (1.62%) TD GEL: 1.0000 | Freq: Every day | TRANSDERMAL | 0 refills | Status: DC

## 2020-11-25 ENCOUNTER — Encounter: Payer: Self-pay | Admitting: Internal Medicine

## 2020-11-25 ENCOUNTER — Other Ambulatory Visit: Payer: Self-pay | Admitting: Internal Medicine

## 2020-11-25 DIAGNOSIS — I10 Essential (primary) hypertension: Secondary | ICD-10-CM

## 2020-11-27 ENCOUNTER — Other Ambulatory Visit: Payer: Self-pay

## 2020-11-27 ENCOUNTER — Ambulatory Visit: Payer: BC Managed Care – PPO | Admitting: Internal Medicine

## 2020-11-27 ENCOUNTER — Encounter: Payer: Self-pay | Admitting: Internal Medicine

## 2020-11-27 VITALS — BP 134/84 | HR 88 | Temp 99.0°F | Resp 16 | Ht 68.0 in | Wt 170.8 lb

## 2020-11-27 DIAGNOSIS — I10 Essential (primary) hypertension: Secondary | ICD-10-CM | POA: Diagnosis not present

## 2020-11-27 DIAGNOSIS — A530 Latent syphilis, unspecified as early or late: Secondary | ICD-10-CM

## 2020-11-27 DIAGNOSIS — E291 Testicular hypofunction: Secondary | ICD-10-CM

## 2020-11-27 DIAGNOSIS — E538 Deficiency of other specified B group vitamins: Secondary | ICD-10-CM

## 2020-11-27 DIAGNOSIS — G63 Polyneuropathy in diseases classified elsewhere: Secondary | ICD-10-CM

## 2020-11-27 LAB — CBC WITH DIFFERENTIAL/PLATELET
Basophils Absolute: 0 10*3/uL (ref 0.0–0.1)
Basophils Relative: 0.7 % (ref 0.0–3.0)
Eosinophils Absolute: 0.1 10*3/uL (ref 0.0–0.7)
Eosinophils Relative: 1.6 % (ref 0.0–5.0)
HCT: 43 % (ref 39.0–52.0)
Hemoglobin: 14.7 g/dL (ref 13.0–17.0)
Lymphocytes Relative: 29 % (ref 12.0–46.0)
Lymphs Abs: 1.8 10*3/uL (ref 0.7–4.0)
MCHC: 34.3 g/dL (ref 30.0–36.0)
MCV: 101.4 fl — ABNORMAL HIGH (ref 78.0–100.0)
Monocytes Absolute: 1 10*3/uL (ref 0.1–1.0)
Monocytes Relative: 16.5 % — ABNORMAL HIGH (ref 3.0–12.0)
Neutro Abs: 3.3 10*3/uL (ref 1.4–7.7)
Neutrophils Relative %: 52.2 % (ref 43.0–77.0)
Platelets: 267 10*3/uL (ref 150.0–400.0)
RBC: 4.24 Mil/uL (ref 4.22–5.81)
RDW: 12.9 % (ref 11.5–15.5)
WBC: 6.3 10*3/uL (ref 4.0–10.5)

## 2020-11-27 LAB — FOLATE: Folate: 15.9 ng/mL (ref >5.9)

## 2020-11-27 LAB — BASIC METABOLIC PANEL
BUN: 13 mg/dL (ref 6–23)
CO2: 30 mEq/L (ref 19–32)
Calcium: 9.6 mg/dL (ref 8.4–10.5)
Chloride: 102 mEq/L (ref 96–112)
Creatinine, Ser: 1.08 mg/dL (ref 0.40–1.50)
GFR: 78.5 mL/min (ref >60.00)
Glucose, Bld: 85 mg/dL (ref 70–99)
Potassium: 3.9 mEq/L (ref 3.5–5.1)
Sodium: 140 mEq/L (ref 135–145)

## 2020-11-27 LAB — VITAMIN B12: Vitamin B-12: 441 pg/mL (ref 211–911)

## 2020-11-27 MED ORDER — CARVEDILOL 3.125 MG PO TABS: 3.1250 mg | ORAL_TABLET | Freq: Two times a day (BID) | ORAL | 1 refills | Status: DC

## 2020-11-27 MED ORDER — IRBESARTAN-HYDROCHLOROTHIAZIDE 300-12.5 MG PO TABS: 1.0000 | ORAL_TABLET | Freq: Every day | ORAL | 1 refills | Status: DC

## 2020-11-27 NOTE — Patient Instructions (Signed)

## 2020-11-27 NOTE — Progress Notes (Signed)
Subjective:  Patient ID: Caleb Obrien, male    DOB: 10-25-1965  Age: 55 y.o. MRN: [id number]  CC: Hypertension  This visit occurred during the SARS-CoV-2 public health emergency.  Safety protocols were in place, including screening questions prior to the visit, additional usage of staff PPE, and extensive cleaning of exam room while observing appropriate contact time as indicated for disinfecting solutions.    HPI [name] presents for f/up -   He has felt well recently and offers no complaints.  He is active and denies CP, DOE, palpitations, edema, fatigue, dizziness, or lightheadedness.  Outpatient Medications Prior to Visit  Medication Sig Dispense Refill   albuterol (VENTOLIN HFA) 108 (90 Base) MCG/ACT inhaler Inhale 1-2 puffs into the lungs every 6 (six) hours as needed for wheezing or shortness of breath. 18 g 5   ALPRAZolam (XANAX) 0.25 MG tablet TAKE 1/2 TABLET BY MOUTH AS NEEDED 30 tablet 3   atorvastatin (LIPITOR) 40 MG tablet TAKE ONE TABLET BY MOUTH DAILY 90 tablet 1   buPROPion (WELLBUTRIN SR) 100 MG 12 hr tablet TAKE ONE TABLET BY MOUTH TWICE A DAY 180 tablet 0   cetirizine (ZYRTEC) 10 MG tablet TAKE 1 TABLET(10 MG) BY MOUTH DAILY 30 tablet 11   Cholecalciferol 2000 units TABS Take 1 tablet (2,000 Units total) by mouth daily. 90 tablet 1   dexlansoprazole (DEXILANT) 60 MG capsule Take 1 capsule (60 mg total) by mouth daily. 90 capsule 1   tadalafil (CIALIS) 20 MG tablet Take 1 tablet (20 mg total) by mouth daily as needed for erectile dysfunction. 10 tablet 5   Testosterone (ANDROGEL PUMP) 20.25 MG/ACT (1.62%) GEL Place 1 Act onto the skin daily. 225 g 0   carvedilol (COREG) 3.125 MG tablet TAKE ONE TABLET BY MOUTH TWICE A DAY WITH MEALS 180 tablet 0   irbesartan-hydrochlorothiazide (AVALIDE) 300-12.5 MG tablet TAKE ONE TABLET BY MOUTH DAILY 90 tablet 0   No facility-administered medications prior to visit.    ROS Review of Systems  Constitutional:   Negative for chills, fatigue and fever.  HENT:  Negative for sore throat and trouble swallowing.   Eyes: Negative.   Respiratory: Negative.  Negative for cough, chest tightness, shortness of breath and wheezing.   Cardiovascular:  Negative for chest pain, palpitations and leg swelling.  Gastrointestinal:  Negative for abdominal pain, diarrhea, nausea and vomiting.  Endocrine: Negative.   Genitourinary: Negative.  Negative for difficulty urinating, dysuria, genital sores, scrotal swelling and testicular pain.  Musculoskeletal: Negative.  Negative for arthralgias and myalgias.  Skin: Negative.  Negative for rash.  Neurological: Negative.  Negative for dizziness, weakness and light-headedness.  Hematological:  Negative for adenopathy. Does not bruise/bleed easily.  Psychiatric/Behavioral: Negative.     Objective:  BP 134/84 (BP Location: Right Arm, Patient Position: Sitting, Cuff Size: Large)   Pulse 88   Temp 99 F (37.2 C) (Oral)   Resp 16   Ht 5\' 8"  (1.727 m)   Wt 170 lb 12.8 oz (77.5 kg)   SpO2 97%   BMI 25.97 kg/m   BP Readings from Last 3 Encounters:  11/27/20 134/84  06/11/20 (!) 142/86  06/03/20 126/82    Wt Readings from Last 3 Encounters:  11/27/20 170 lb 12.8 oz (77.5 kg)  06/11/20 169 lb (76.7 kg)  06/03/20 166 lb (75.3 kg)    Physical Exam Vitals reviewed.  HENT:     Nose: Nose normal.     Mouth/Throat:     Mouth:  Mucous membranes are moist.  Eyes:     General: No scleral icterus.    Conjunctiva/sclera: Conjunctivae normal.  Cardiovascular:     Rate and Rhythm: Normal rate and regular rhythm.     Heart sounds: No murmur heard. Pulmonary:     Effort: Pulmonary effort is normal.     Breath sounds: No stridor. No wheezing, rhonchi or rales.  Abdominal:     General: Abdomen is flat.     Palpations: There is no mass.     Tenderness: There is no abdominal tenderness. There is no guarding.     Hernia: No hernia is present.  Musculoskeletal:         General: Normal range of motion.     Cervical back: Neck supple.     Right lower leg: No edema.     Left lower leg: No edema.  Lymphadenopathy:     Cervical: No cervical adenopathy.  Skin:    General: Skin is warm and dry.  Neurological:     General: No focal deficit present.     Mental Status: He is alert.  Psychiatric:        Mood and Affect: Mood normal.        Behavior: Behavior normal.        Thought Content: Thought content normal.        Judgment: Judgment normal.    Lab Results  Component Value Date   WBC 6.3 11/27/2020   HGB 14.7 11/27/2020   HCT 43.0 11/27/2020   PLT 267.0 11/27/2020   GLUCOSE 85 11/27/2020   CHOL 153 06/03/2020   TRIG 91.0 06/03/2020   HDL 49.30 06/03/2020   LDLDIRECT 144.4 01/27/2012   LDLCALC 86 06/03/2020   ALT 27 06/03/2020   AST 20 06/03/2020   NA 140 11/27/2020   K 3.9 11/27/2020   CL 102 11/27/2020   CREATININE 1.08 11/27/2020   BUN 13 11/27/2020   CO2 30 11/27/2020   TSH 2.99 01/09/2020   PSA 1.19 06/03/2020    DG Ribs Unilateral W/Chest Left  Result Date: 04/06/2016 CLINICAL DATA:  Fall 4 days ago with left-sided chest pain EXAM: LEFT RIBS AND CHEST - 3+ VIEW COMPARISON:  01/24/2014 FINDINGS: Cardiac shadow is within normal limits. The lungs are clear bilaterally. No pneumothorax or sizable effusion is noted. Minimal irregularity of the anterior aspect of the left sixth rib is noted which may represent an undisplaced fracture. No other fractures are noted. IMPRESSION: Irregularity of the anterior aspect of the left sixth rib which may represent an undisplaced fracture. Electronically Signed   By: Inez Catalina M.D.   On: 04/06/2016 15:14    Assessment & Plan:   Daryll was seen today for hypertension.  Diagnoses and all orders for this visit:  Essential hypertension, benign- His BP is well controlled. -     Basic metabolic panel; Future -     CBC with Differential/Platelet; Future -     carvedilol (COREG) 3.125 MG tablet; Take  1 tablet (3.125 mg total) by mouth 2 (two) times daily with a meal. -     irbesartan-hydrochlorothiazide (AVALIDE) 300-12.5 MG tablet; Take 1 tablet by mouth daily. 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 -     CBC with Differential/Platelet; Future -     Folate; Future -     Vitamin B12; Future -  Vitamin B12 -     Folate -     CBC with Differential/Platelet  Syphili, latent- His RPR is negative. -     RPR; Future -     HIV antibody (with reflex) -     RPR  Hypogonadism in male- His T level is in the normal range. -     Testosterone Total,Free,Bio, Males; Future -     Testosterone Total,Free,Bio, Males  Other orders -     Cancel: RPR; Future  I have changed Logan C. Granquist "Chris"'s carvedilol and irbesartan-hydrochlorothiazide. I am also having him maintain his cetirizine, Cholecalciferol, tadalafil, albuterol, ALPRAZolam, dexlansoprazole, atorvastatin, buPROPion, and Testosterone.  Meds ordered this encounter  Medications   carvedilol (COREG) 3.125 MG tablet    Sig: Take 1 tablet (3.125 mg total) by mouth 2 (two) times daily with a meal.    Dispense:  180 tablet    Refill:  1   irbesartan-hydrochlorothiazide (AVALIDE) 300-12.5 MG tablet    Sig: Take 1 tablet by mouth daily.    Dispense:  90 tablet    Refill:  1      Follow-up: Return in about 6 months (around 05/29/2021).  Scarlette Calico, MD

## 2020-11-30 LAB — TESTOSTERONE TOTAL,FREE,BIO, MALES
Albumin: 4.6 g/dL (ref 3.6–5.1)
Sex Hormone Binding: 32 nmol/L (ref 10–50)
Testosterone, Bioavailable: 130.9 ng/dL (ref 110.0–575.0)
Testosterone, Free: 62.3 pg/mL (ref 46.0–224.0)
Testosterone: 459 ng/dL (ref 250–827)

## 2020-11-30 LAB — HIV ANTIBODY (ROUTINE TESTING W REFLEX): HIV 1&2 Ab, 4th Generation: NONREACTIVE

## 2020-11-30 LAB — RPR: RPR Ser Ql: NONREACTIVE

## 2020-12-09 ENCOUNTER — Encounter: Payer: Self-pay | Admitting: Adult Health

## 2020-12-09 ENCOUNTER — Ambulatory Visit (INDEPENDENT_AMBULATORY_CARE_PROVIDER_SITE_OTHER): Payer: BC Managed Care – PPO | Admitting: Adult Health

## 2020-12-09 ENCOUNTER — Other Ambulatory Visit: Payer: Self-pay

## 2020-12-09 VITALS — BP 132/87 | HR 91 | Ht 62.0 in | Wt 172.0 lb

## 2020-12-09 DIAGNOSIS — Z9989 Dependence on other enabling machines and devices: Secondary | ICD-10-CM

## 2020-12-09 DIAGNOSIS — G4733 Obstructive sleep apnea (adult) (pediatric): Secondary | ICD-10-CM | POA: Diagnosis not present

## 2020-12-09 NOTE — Progress Notes (Signed)
PATIENT: Caleb Obrien DOB: 24-May-1966  REASON FOR VISIT: follow up HISTORY FROM: patient Primary Neurologist: Dr. Brett Fairy  HISTORY OF PRESENT ILLNESS: Today 12/09/20:  Caleb Obrien is a 55 year old male with a history of OSA on CPAP. He returns today for follow-up.  He reports that the CPAP is working well for him.  He denies any new issues.  Reports that he is not using machine as consistently as he used to due to needing new tubing.  He states that he has reached out to his DME company.     08/15/19: Caleb Obrien is a 55 year old male with a history of obstructive sleep apnea on CPAP.  His download indicates that he uses machine 20 out of 30 days for compliance of 67%.  He only uses machine greater than 4 hours 16 days for compliance of 53%.  On average he uses his machine 5 hours and 5 minutes.  His residual AHI is 0.5 on 5 to 10 cm of water with EPR 3.  Leak in the 95th percentile is 7.5.  HISTORY Caleb Obrien is a 55 y.o. male and is here for a regular yearly CPAP follow-up.  The patient has been a compliant CPAP user his blood pressure is well controlled he is not diabetic and in the meantime since his last visit with me he has quit smoking in May 2019.  He also has undergone a blepharoplasty to correct ptosis.  He states that there are no difficulties with using CPAP and the results is very positive.  He uses the machine 30 out of 30 days with 75% compliance for 4 hours or more of consecutive use.  Average user time is 5 hours 29 minutes, he is using an AutoSet between 5 and 10 cmH2O with 3 cm EPR and the residual AHI is 0.5 which speaks for a good resolution of apnea.  The 95th percentile pressure is 7.8 and there are equal numbers of central and obstructive apneas within the residual count.  He has minimal air leakage at this pressure the amount is not varying very much from night to night. He uses nasal pillows.   REVIEW OF SYSTEMS: Out of a complete 14 system review of  symptoms, the patient complains only of the following symptoms, and all other reviewed systems are negative.  ESS 3  ALLERGIES: Allergies  Allergen Reactions   Amlodipine     cough   Lisinopril     cough   Sudafed [Pseudoephedrine Hcl] Other (See Comments)    prostatism    HOME MEDICATIONS: Outpatient Medications Prior to Visit  Medication Sig Dispense Refill   albuterol (VENTOLIN HFA) 108 (90 Base) MCG/ACT inhaler Inhale 1-2 puffs into the lungs every 6 (six) hours as needed for wheezing or shortness of breath. 18 g 5   ALPRAZolam (XANAX) 0.25 MG tablet TAKE 1/2 TABLET BY MOUTH AS NEEDED 30 tablet 3   atorvastatin (LIPITOR) 40 MG tablet TAKE ONE TABLET BY MOUTH DAILY 90 tablet 1   buPROPion (WELLBUTRIN SR) 100 MG 12 hr tablet TAKE ONE TABLET BY MOUTH TWICE A DAY 180 tablet 0   carvedilol (COREG) 3.125 MG tablet Take 1 tablet (3.125 mg total) by mouth 2 (two) times daily with a meal. 180 tablet 1   cetirizine (ZYRTEC) 10 MG tablet TAKE 1 TABLET(10 MG) BY MOUTH DAILY 30 tablet 11   Cholecalciferol 2000 units TABS Take 1 tablet (2,000 Units total) by mouth daily. 90 tablet 1   dexlansoprazole (  DEXILANT) 60 MG capsule Take 1 capsule (60 mg total) by mouth daily. 90 capsule 1   irbesartan-hydrochlorothiazide (AVALIDE) 300-12.5 MG tablet Take 1 tablet by mouth daily. 90 tablet 1   tadalafil (CIALIS) 20 MG tablet Take 1 tablet (20 mg total) by mouth daily as needed for erectile dysfunction. 10 tablet 5   Testosterone (ANDROGEL PUMP) 20.25 MG/ACT (1.62%) GEL Place 1 Act onto the skin daily. 225 g 0   No facility-administered medications prior to visit.    PAST MEDICAL HISTORY: Past Medical History:  Diagnosis Date   ALLERGIC RHINITIS CAUSE UNSPECIFIED 02/24/2009   Allergy    ASTHMA 02/05/2007   BENIGN PROSTATIC HYPERTROPHY, WITH URINARY OBSTRUCTION 05/22/2007   Dermatophytosis of foot 11/02/2009   GANGLION CYST, WRIST, LEFT 03/25/2009   Hemangioma of skin and subcutaneous tissue  06/30/2009   HEPATITIS B, HX OF 02/05/2007   per pt no history   HYPERLIPIDEMIA, BORDERLINE 04/21/2008   PLANTAR WART, RIGHT 01/23/2009   Sleep apnea    uses c-pap    PAST SURGICAL HISTORY: Past Surgical History:  Procedure Laterality Date   eyelid surgery  04/11/2018   Bil   WISDOM TOOTH EXTRACTION      FAMILY HISTORY: Family History  Problem Relation Age of Onset   Hypertension Mother    Arthritis Mother    Prostate cancer Father    Healthy Sister    Colon cancer Neg Hx    Esophageal cancer Neg Hx    Stomach cancer Neg Hx    Rectal cancer Neg Hx     SOCIAL HISTORY: Social History   Socioeconomic History   Marital status: Single    Spouse name: Not on file   Number of children: Not on file   Years of education: Not on file   Highest education level: Not on file  Occupational History   Occupation: HR    Employer: [employer].  Tobacco Use   Smoking status: Former    Packs/day: 0.30    Pack years: 0.00    Types: Cigarettes    Quit date: 10/2017    Years since quitting: 3.1   Smokeless tobacco: Never  Vaping Use   Vaping Use: Never used  Substance and Sexual Activity   Alcohol use: Yes    Alcohol/week: 7.0 standard drinks    Types: 7 Standard drinks or equivalent per week   Drug use: No   Sexual activity: Yes  Other Topics Concern   Not on file  Social History Narrative   Not on file   Social Determinants of Health   Financial Resource Strain: Not on file  Food Insecurity: Not on file  Transportation Needs: Not on file  Physical Activity: Not on file  Stress: Not on file  Social Connections: Not on file  Intimate Partner Violence: Not on file      PHYSICAL EXAM  Vitals:   12/09/20 0853  BP: 132/87  Pulse: 91  Weight: 172 lb (78 kg)  Height: 5\' 2"  (1.575 m)   Body mass index is 31.46 kg/m.  Generalized: Well developed, in no acute distress  Chest: Lungs clear to auscultation bilaterally  Neurological examination   Mentation: Alert oriented to time, place, history taking. Follows all commands speech and language fluent Cranial nerve II-XII: Extraocular movements were full, visual field were full on confrontational test Head turning and shoulder shrug  were normal and symmetric. Motor: The motor testing reveals 5 over 5 strength of all 4 extremities. Good symmetric motor tone  is noted throughout.  Sensory: Sensory testing is intact to soft touch on all 4 extremities. No evidence of extinction is noted.  Gait and station: Gait is normal.    DIAGNOSTIC DATA (LABS, IMAGING, TESTING) - I reviewed patient records, labs, notes, testing and imaging myself where available.  Lab Results  Component Value Date   WBC 6.3 11/27/2020   HGB 14.7 11/27/2020   HCT 43.0 11/27/2020   MCV 101.4 (H) 11/27/2020   PLT 267.0 11/27/2020      Component Value Date/Time   NA 140 11/27/2020 1028   K 3.9 11/27/2020 1028   CL 102 11/27/2020 1028   CO2 30 11/27/2020 1028   GLUCOSE 85 11/27/2020 1028   BUN 13 11/27/2020 1028   CREATININE 1.08 11/27/2020 1028   CALCIUM 9.6 11/27/2020 1028   PROT 7.3 06/03/2020 0910   ALBUMIN 4.6 06/03/2020 0910   AST 20 06/03/2020 0910   ALT 27 06/03/2020 0910   ALKPHOS 88 06/03/2020 0910   BILITOT 0.8 06/03/2020 0910   GFRNONAA 77.08 01/18/2010 0755   GFRAA 95 01/10/2008 0931   Lab Results  Component Value Date   CHOL 153 06/03/2020   HDL 49.30 06/03/2020   LDLCALC 86 06/03/2020   LDLDIRECT 144.4 01/27/2012   TRIG 91.0 06/03/2020   CHOLHDL 3 06/03/2020   No results found for: HGBA1C Lab Results  Component Value Date   VITAMINB12 441 11/27/2020   Lab Results  Component Value Date   TSH 2.99 01/09/2020      ASSESSMENT AND PLAN 55 y.o. year old male  has a past medical history of ALLERGIC RHINITIS CAUSE UNSPECIFIED (02/24/2009), Allergy, ASTHMA (02/05/2007), BENIGN PROSTATIC HYPERTROPHY, WITH URINARY OBSTRUCTION (05/22/2007), Dermatophytosis of foot (11/02/2009), GANGLION  CYST, WRIST, LEFT (03/25/2009), Hemangioma of skin and subcutaneous tissue (06/30/2009), HEPATITIS B, HX OF (02/05/2007), HYPERLIPIDEMIA, BORDERLINE (04/21/2008), PLANTAR WART, RIGHT (01/23/2009), and Sleep apnea. here with:  OSA on CPAP  - CPAP compliance suboptimal - Good treatment of AHI  - Encourage patient to use CPAP nightly and > 4 hours each night - F/U in 1 year or sooner if needed    Ward Givens, MSN, NP-C 12/09/2020, 8:59 AM [facility] [address], [place], [place] [address] [phone]

## 2020-12-09 NOTE — Patient Instructions (Signed)
Continue using CPAP nightly and greater than 4 hours each night °If your symptoms worsen or you develop new symptoms please let us know.  ° °

## 2020-12-25 DIAGNOSIS — G4733 Obstructive sleep apnea (adult) (pediatric): Secondary | ICD-10-CM | POA: Diagnosis not present

## 2021-01-03 ENCOUNTER — Other Ambulatory Visit: Payer: Self-pay | Admitting: Internal Medicine

## 2021-01-03 DIAGNOSIS — I10 Essential (primary) hypertension: Secondary | ICD-10-CM

## 2021-01-27 ENCOUNTER — Other Ambulatory Visit: Payer: Self-pay | Admitting: Internal Medicine

## 2021-01-27 ENCOUNTER — Encounter: Payer: Self-pay | Admitting: Internal Medicine

## 2021-01-27 DIAGNOSIS — U071 COVID-19: Secondary | ICD-10-CM | POA: Insufficient documentation

## 2021-01-27 MED ORDER — PAXLOVID 20 X 150 MG & 10 X 100MG PO TBPK: 3.0000 | ORAL_TABLET | Freq: Two times a day (BID) | ORAL | 0 refills | Status: AC

## 2021-02-02 ENCOUNTER — Encounter: Payer: Self-pay | Admitting: Internal Medicine

## 2021-02-02 DIAGNOSIS — J069 Acute upper respiratory infection, unspecified: Secondary | ICD-10-CM | POA: Diagnosis not present

## 2021-02-02 DIAGNOSIS — U071 COVID-19: Secondary | ICD-10-CM | POA: Diagnosis not present

## 2021-02-06 ENCOUNTER — Other Ambulatory Visit: Payer: Self-pay | Admitting: Internal Medicine

## 2021-02-06 DIAGNOSIS — N522 Drug-induced erectile dysfunction: Secondary | ICD-10-CM

## 2021-02-12 ENCOUNTER — Telehealth: Payer: Self-pay

## 2021-02-12 NOTE — Telephone Encounter (Signed)
Key: [id number]  Effective from 02/12/2021 through 02/11/2022.

## 2021-03-03 DIAGNOSIS — Z713 Dietary counseling and surveillance: Secondary | ICD-10-CM | POA: Diagnosis not present

## 2021-03-06 DIAGNOSIS — R42 Dizziness and giddiness: Secondary | ICD-10-CM | POA: Diagnosis not present

## 2021-03-06 DIAGNOSIS — R231 Pallor: Secondary | ICD-10-CM | POA: Diagnosis not present

## 2021-03-06 DIAGNOSIS — E860 Dehydration: Secondary | ICD-10-CM | POA: Diagnosis not present

## 2021-03-06 DIAGNOSIS — R0902 Hypoxemia: Secondary | ICD-10-CM | POA: Diagnosis not present

## 2021-03-10 NOTE — Progress Notes (Signed)
Subjective:    Patient ID: Caleb Obrien, male    DOB: [birth year], 55 y.o.   MRN: [id number]  This visit occurred during the SARS-CoV-2 public health emergency.  Safety protocols were in place, including screening questions prior to the visit, additional usage of staff PPE, and extensive cleaning of exam room while observing appropriate contact time as indicated for disinfecting solutions.    HPI The patient is here for an acute visit.   lightheadedness over the weekend - he had the 2nd shingles vaccine on Friday and felt fine.  He woke up Saturday morning and he ate and drank normally. He had two cups of coffee and two bottles of water.  He ate twice.  He took his medication and later took his cialis.   Was out and got lightheaded.  He had diaphoresis and photophobia. He called a friend to come pick him up who convinced him to call 911. - EMS came and evaluated him. Marland Kitchen  His BP 80/55  O2 80's.  Telemetry by EMS was normal.  Gave small bag of fluids.  He felt better and was able to go home.  He drank extra fluids.The rest of the day he was fine.  The following day he felt great.    He had two similar episodes in the past - 23 years ago due to dehydrated and 29 years ago the ob/gyn office and at that time his BP was 80/40 and he recovered quickly and was fine right away.     Checks BP occ at home.  He takes the coreg once a day in am - forgets it in the evening.  He has taken cialis in the past and had no issues.       Medications and allergies reviewed with patient and updated if appropriate.  Patient Active Problem List   Diagnosis Date Noted   Clinical diagnosis of COVID-19 01/27/2021   Syphili, latent 11/27/2020   Encounter for general adult medical examination with abnormal findings 06/03/2020   Hypogonadism in male 06/03/2020   Primary syphilis 12/10/2019   STD exposure 05/18/2019   Hemorrhoids 01/24/2019   Vitamin D insufficiency 12/27/2017   Depression with anxiety  08/22/2017   Vitamin B12 deficiency neuropathy ([place]) 09/08/2016   GERD with apnea 04/26/2016   High risk homosexual behavior 04/12/2016   Hyperlipidemia with target LDL less than 130 04/28/2015   Drug-induced erectile dysfunction 04/27/2015   Essential hypertension, benign 02/08/2014   Mild persistent asthma 02/08/2014   OSA (obstructive sleep apnea) 07/12/2013   GAD (generalized anxiety disorder) 02/05/2007    Current Outpatient Medications on File Prior to Visit  Medication Sig Dispense Refill   albuterol (VENTOLIN HFA) 108 (90 Base) MCG/ACT inhaler Inhale 1-2 puffs into the lungs every 6 (six) hours as needed for wheezing or shortness of breath. 18 g 5   ALPRAZolam (XANAX) 0.25 MG tablet TAKE 1/2 TABLET BY MOUTH AS NEEDED 30 tablet 3   atorvastatin (LIPITOR) 40 MG tablet TAKE ONE TABLET BY MOUTH DAILY 90 tablet 1   buPROPion (WELLBUTRIN SR) 100 MG 12 hr tablet TAKE ONE TABLET BY MOUTH TWICE A DAY 180 tablet 0   carvedilol (COREG) 3.125 MG tablet TAKE ONE TABLET BY MOUTH TWICE A DAY WITH MEALS 180 tablet 1   cetirizine (ZYRTEC) 10 MG tablet TAKE 1 TABLET(10 MG) BY MOUTH DAILY 30 tablet 11   Cholecalciferol 2000 units TABS Take 1 tablet (2,000 Units total) by mouth daily. 90 tablet 1  dexlansoprazole (DEXILANT) 60 MG capsule Take 1 capsule (60 mg total) by mouth daily. 90 capsule 1   irbesartan-hydrochlorothiazide (AVALIDE) 300-12.5 MG tablet Take 1 tablet by mouth daily. 90 tablet 1   tadalafil (CIALIS) 20 MG tablet TAKE ONE TABLET BY MOUTH DAILY AS NEEDED FOR FOR ERECTILE DYSFUNCTION 10 tablet 5   Testosterone (ANDROGEL PUMP) 20.25 MG/ACT (1.62%) GEL Place 1 Act onto the skin daily. 225 g 0   No current facility-administered medications on file prior to visit.    Past Medical History:  Diagnosis Date   ALLERGIC RHINITIS CAUSE UNSPECIFIED 02/24/2009   Allergy    ASTHMA 02/05/2007   BENIGN PROSTATIC HYPERTROPHY, WITH URINARY OBSTRUCTION 05/22/2007   Dermatophytosis of foot 11/02/2009    GANGLION CYST, WRIST, LEFT 03/25/2009   Hemangioma of skin and subcutaneous tissue 06/30/2009   HEPATITIS B, HX OF 02/05/2007   per pt no history   HYPERLIPIDEMIA, BORDERLINE 04/21/2008   PLANTAR WART, RIGHT 01/23/2009   Sleep apnea    uses c-pap    Past Surgical History:  Procedure Laterality Date   eyelid surgery  04/11/2018   Bil   WISDOM TOOTH EXTRACTION      Social History   Socioeconomic History   Marital status: Single    Spouse name: Not on file   Number of children: Not on file   Years of education: Not on file   Highest education level: Not on file  Occupational History   Occupation: HR    Employer: [employer].  Tobacco Use   Smoking status: Former    Packs/day: 0.30    Types: Cigarettes    Quit date: 10/2017    Years since quitting: 3.4   Smokeless tobacco: Never  Vaping Use   Vaping Use: Never used  Substance and Sexual Activity   Alcohol use: Yes    Alcohol/week: 7.0 standard drinks    Types: 7 Standard drinks or equivalent per week   Drug use: No   Sexual activity: Yes  Other Topics Concern   Not on file  Social History Narrative   Not on file   Social Determinants of Health   Financial Resource Strain: Not on file  Food Insecurity: Not on file  Transportation Needs: Not on file  Physical Activity: Not on file  Stress: Not on file  Social Connections: Not on file    Family History  Problem Relation Age of Onset   Hypertension Mother    Arthritis Mother    Prostate cancer Father    Healthy Sister    Colon cancer Neg Hx    Esophageal cancer Neg Hx    Stomach cancer Neg Hx    Rectal cancer Neg Hx     Review of Systems  Constitutional:  Positive for diaphoresis. Negative for chills and fever.  Eyes:  Positive for photophobia. Negative for visual disturbance.  Respiratory:  Negative for cough, shortness of breath and wheezing.   Cardiovascular:  Negative for chest pain, palpitations and leg swelling.  Gastrointestinal:   Positive for nausea (brief). Negative for abdominal pain.  Neurological:  Positive for light-headedness. Negative for dizziness, numbness and headaches.      Objective:   Vitals:   03/11/21 0808  BP: 120/90  Pulse: 98  Temp: 99.4 F (37.4 C)  SpO2: 98%   BP Readings from Last 3 Encounters:  03/11/21 120/90  12/09/20 132/87  11/27/20 134/84   Wt Readings from Last 3 Encounters:  03/11/21 160 lb (72.6 kg)  12/09/20 172  lb (78 kg)  11/27/20 170 lb 12.8 oz (77.5 kg)   Body mass index is 29.26 kg/m.   Physical Exam    Constitutional: Appears well-developed and well-nourished. No distress.  Head: Normocephalic and atraumatic.  Neck: Neck supple. No tracheal deviation present. No thyromegaly present.  No cervical lymphadenopathy Cardiovascular: Normal rate, regular rhythm and normal heart sounds.  No murmur heard. No carotid bruit .  No edema Pulmonary/Chest: Effort normal and breath sounds normal. No respiratory distress. No has no wheezes. No rales.  Neurological: CN 2-12 grossly intact.  Normal strength and sensation in all extremities Skin: Skin is warm and dry. Not diaphoretic.  Psychiatric: Normal mood and affect. Behavior is normal.    Lab Results  Component Value Date   WBC 6.3 11/27/2020   HGB 14.7 11/27/2020   HCT 43.0 11/27/2020   PLT 267.0 11/27/2020   GLUCOSE 85 11/27/2020   CHOL 153 06/03/2020   TRIG 91.0 06/03/2020   HDL 49.30 06/03/2020   LDLDIRECT 144.4 01/27/2012   LDLCALC 86 06/03/2020   ALT 27 06/03/2020   AST 20 06/03/2020   NA 140 11/27/2020   K 3.9 11/27/2020   CL 102 11/27/2020   CREATININE 1.08 11/27/2020   BUN 13 11/27/2020   CO2 30 11/27/2020   TSH 2.99 01/09/2020   PSA 1.19 06/03/2020       Assessment & Plan:    Hypotension with presyncope His symptoms are consistent with presyncope due to hypotension He denies dehydration He did take cialis that morning and that be the cause in addition to his other bp meds Work up by EMS  confirmed hypotension - Telemetry normal by EMS Discussed EKG and cardiology referral and he deferred -- he would like to monitor for now and if symptoms recur he will consider further evaluation Monitor BP daily - do at different times --pay extra attention to when he takes cialis.   He will stay well hydrated.   Hypertension Chronic Well controlled today and in the recent past continue Avalide 300-12.5 mg daily and carvedilol 3.125 mg twice daily (he is actually only taking once daily) If he has concerning readings at home we can adjust medications

## 2021-03-11 ENCOUNTER — Other Ambulatory Visit: Payer: Self-pay

## 2021-03-11 ENCOUNTER — Encounter: Payer: Self-pay | Admitting: Internal Medicine

## 2021-03-11 ENCOUNTER — Ambulatory Visit: Payer: BC Managed Care – PPO | Admitting: Internal Medicine

## 2021-03-11 VITALS — BP 120/90 | HR 98 | Temp 99.4°F | Ht 62.0 in | Wt 160.0 lb

## 2021-03-11 DIAGNOSIS — I952 Hypotension due to drugs: Secondary | ICD-10-CM

## 2021-03-11 DIAGNOSIS — I10 Essential (primary) hypertension: Secondary | ICD-10-CM | POA: Diagnosis not present

## 2021-03-11 NOTE — Patient Instructions (Addendum)
   This was likely a vasovagal episode.    Medications changes include :   none    Monitor your BP especially when taking the cialis.     We can consider cardiology referral if symptoms recur.

## 2021-03-21 ENCOUNTER — Other Ambulatory Visit: Payer: Self-pay | Admitting: Internal Medicine

## 2021-03-21 DIAGNOSIS — E785 Hyperlipidemia, unspecified: Secondary | ICD-10-CM

## 2021-03-22 ENCOUNTER — Encounter: Payer: Self-pay | Admitting: Internal Medicine

## 2021-03-27 ENCOUNTER — Other Ambulatory Visit: Payer: Self-pay | Admitting: Internal Medicine

## 2021-03-27 DIAGNOSIS — F411 Generalized anxiety disorder: Secondary | ICD-10-CM

## 2021-04-15 ENCOUNTER — Encounter: Payer: Self-pay | Admitting: Internal Medicine

## 2021-04-16 ENCOUNTER — Other Ambulatory Visit: Payer: Self-pay | Admitting: Internal Medicine

## 2021-04-16 DIAGNOSIS — E291 Testicular hypofunction: Secondary | ICD-10-CM

## 2021-04-16 MED ORDER — TESTOSTERONE 20.25 MG/ACT (1.62%) TD GEL: 1.0000 | Freq: Every day | TRANSDERMAL | 0 refills | Status: DC

## 2021-04-18 ENCOUNTER — Other Ambulatory Visit: Payer: Self-pay | Admitting: Internal Medicine

## 2021-04-18 DIAGNOSIS — E291 Testicular hypofunction: Secondary | ICD-10-CM

## 2021-04-18 MED ORDER — TESTOSTERONE 20.25 MG/ACT (1.62%) TD GEL: 1.0000 | Freq: Every day | TRANSDERMAL | 0 refills | Status: DC

## 2021-05-18 DIAGNOSIS — F4323 Adjustment disorder with mixed anxiety and depressed mood: Secondary | ICD-10-CM | POA: Diagnosis not present

## 2021-05-25 ENCOUNTER — Other Ambulatory Visit: Payer: Self-pay | Admitting: Internal Medicine

## 2021-05-25 DIAGNOSIS — I10 Essential (primary) hypertension: Secondary | ICD-10-CM

## 2021-05-28 DIAGNOSIS — F4323 Adjustment disorder with mixed anxiety and depressed mood: Secondary | ICD-10-CM | POA: Diagnosis not present

## 2021-06-08 ENCOUNTER — Other Ambulatory Visit: Payer: Self-pay

## 2021-06-08 ENCOUNTER — Ambulatory Visit (INDEPENDENT_AMBULATORY_CARE_PROVIDER_SITE_OTHER): Payer: BC Managed Care – PPO | Admitting: Internal Medicine

## 2021-06-08 ENCOUNTER — Encounter: Payer: Self-pay | Admitting: Internal Medicine

## 2021-06-08 VITALS — BP 130/82 | HR 87 | Temp 98.1°F | Resp 16 | Ht 67.0 in | Wt 171.0 lb

## 2021-06-08 DIAGNOSIS — H1033 Unspecified acute conjunctivitis, bilateral: Secondary | ICD-10-CM | POA: Diagnosis not present

## 2021-06-08 DIAGNOSIS — E538 Deficiency of other specified B group vitamins: Secondary | ICD-10-CM | POA: Diagnosis not present

## 2021-06-08 DIAGNOSIS — G63 Polyneuropathy in diseases classified elsewhere: Secondary | ICD-10-CM

## 2021-06-08 DIAGNOSIS — I10 Essential (primary) hypertension: Secondary | ICD-10-CM

## 2021-06-08 DIAGNOSIS — Z0001 Encounter for general adult medical examination with abnormal findings: Secondary | ICD-10-CM | POA: Diagnosis not present

## 2021-06-08 DIAGNOSIS — Z7252 High risk homosexual behavior: Secondary | ICD-10-CM

## 2021-06-08 DIAGNOSIS — E291 Testicular hypofunction: Secondary | ICD-10-CM | POA: Diagnosis not present

## 2021-06-08 LAB — HEPATIC FUNCTION PANEL
ALT: 31 U/L (ref 0–53)
AST: 21 U/L (ref 0–37)
Albumin: 4.3 g/dL (ref 3.5–5.2)
Alkaline Phosphatase: 65 U/L (ref 39–117)
Bilirubin, Direct: 0.1 mg/dL (ref 0.0–0.3)
Total Bilirubin: 0.6 mg/dL (ref 0.2–1.2)
Total Protein: 6.6 g/dL (ref 6.0–8.3)

## 2021-06-08 LAB — CBC WITH DIFFERENTIAL/PLATELET
Basophils Absolute: 0 10*3/uL (ref 0.0–0.1)
Basophils Relative: 0.5 % (ref 0.0–3.0)
Eosinophils Absolute: 0.1 10*3/uL (ref 0.0–0.7)
Eosinophils Relative: 1.3 % (ref 0.0–5.0)
HCT: 41.7 % (ref 39.0–52.0)
Hemoglobin: 14.2 g/dL (ref 13.0–17.0)
Lymphocytes Relative: 19.7 % (ref 12.0–46.0)
Lymphs Abs: 1.2 10*3/uL (ref 0.7–4.0)
MCHC: 34 g/dL (ref 30.0–36.0)
MCV: 102.1 fl — ABNORMAL HIGH (ref 78.0–100.0)
Monocytes Absolute: 1 10*3/uL (ref 0.1–1.0)
Monocytes Relative: 16.9 % — ABNORMAL HIGH (ref 3.0–12.0)
Neutro Abs: 3.7 10*3/uL (ref 1.4–7.7)
Neutrophils Relative %: 61.6 % (ref 43.0–77.0)
Platelets: 259 10*3/uL (ref 150.0–400.0)
RBC: 4.09 Mil/uL — ABNORMAL LOW (ref 4.22–5.81)
RDW: 12.7 % (ref 11.5–15.5)
WBC: 6 10*3/uL (ref 4.0–10.5)

## 2021-06-08 LAB — TSH: TSH: 3.44 u[IU]/mL (ref 0.35–5.50)

## 2021-06-08 LAB — URINALYSIS, ROUTINE W REFLEX MICROSCOPIC
Bilirubin Urine: NEGATIVE
Hgb urine dipstick: NEGATIVE
Ketones, ur: NEGATIVE
Leukocytes,Ua: NEGATIVE
Nitrite: NEGATIVE
Specific Gravity, Urine: 1.02 (ref 1.000–1.030)
Total Protein, Urine: NEGATIVE
Urine Glucose: NEGATIVE
Urobilinogen, UA: 0.2 (ref 0.0–1.0)
pH: 7 (ref 5.0–8.0)

## 2021-06-08 LAB — BASIC METABOLIC PANEL
BUN: 17 mg/dL (ref 6–23)
CO2: 30 mEq/L (ref 19–32)
Calcium: 9.4 mg/dL (ref 8.4–10.5)
Chloride: 103 mEq/L (ref 96–112)
Creatinine, Ser: 0.91 mg/dL (ref 0.40–1.50)
GFR: 96.06 mL/min (ref >60.00)
Glucose, Bld: 102 mg/dL — ABNORMAL HIGH (ref 70–99)
Potassium: 4 mEq/L (ref 3.5–5.1)
Sodium: 140 mEq/L (ref 135–145)

## 2021-06-08 LAB — LIPID PANEL
Cholesterol: 153 mg/dL (ref 0–200)
HDL: 50.4 mg/dL (ref >39.00)
LDL Cholesterol: 83 mg/dL (ref 0–99)
NonHDL: 102.16
Total CHOL/HDL Ratio: 3
Triglycerides: 95 mg/dL (ref 0.0–149.0)
VLDL: 19 mg/dL (ref 0.0–40.0)

## 2021-06-08 LAB — FOLATE: Folate: 15.1 ng/mL (ref >5.9)

## 2021-06-08 LAB — PSA: PSA: 1.14 ng/mL (ref 0.10–4.00)

## 2021-06-08 LAB — VITAMIN B12: Vitamin B-12: 608 pg/mL (ref 211–911)

## 2021-06-08 MED ORDER — CIPROFLOXACIN HCL 0.3 % OP SOLN
2.0000 [drp] | OPHTHALMIC | 0 refills | Status: DC
Start: 2021-06-08 — End: 2021-08-17

## 2021-06-08 NOTE — Patient Instructions (Signed)

## 2021-06-08 NOTE — Progress Notes (Signed)
Subjective:  Patient ID: Caleb Obrien, male    DOB: Jul 15, 1965  Age: 55 y.o. MRN: [id number]  CC: Annual Exam and Hypertension  This visit occurred during the SARS-CoV-2 public health emergency.  Safety protocols were in place, including screening questions prior to the visit, additional usage of staff PPE, and extensive cleaning of exam room while observing appropriate contact time as indicated for disinfecting solutions.    HPI JAHMAI FINELLI presents for a CPX and f/up -   He would like to start taking PrEP again.  About 2 weeks ago he did a virtual visit for conjunctivitis.  He used an unspecified eyedrop and says his symptoms improved but now they are returning.  He wears contacts but has not worn them for the last week.  He has had some matting, redness, and irritation.  He denies photophobia or visual disturbance.  Outpatient Medications Prior to Visit  Medication Sig Dispense Refill   albuterol (VENTOLIN HFA) 108 (90 Base) MCG/ACT inhaler Inhale 1-2 puffs into the lungs every 6 (six) hours as needed for wheezing or shortness of breath. 18 g 5   ALPRAZolam (XANAX) 0.25 MG tablet TAKE 1/2 TABLET BY MOUTH AS NEEDED 30 tablet 3   atorvastatin (LIPITOR) 40 MG tablet TAKE ONE TABLET BY MOUTH DAILY 90 tablet 0   buPROPion ER (WELLBUTRIN SR) 100 MG 12 hr tablet TAKE ONE TABLET BY MOUTH TWICE A DAY 180 tablet 0   carvedilol (COREG) 3.125 MG tablet TAKE ONE TABLET BY MOUTH TWICE A DAY WITH MEALS 180 tablet 1   cetirizine (ZYRTEC) 10 MG tablet TAKE 1 TABLET(10 MG) BY MOUTH DAILY 30 tablet 11   Cholecalciferol 2000 units TABS Take 1 tablet (2,000 Units total) by mouth daily. 90 tablet 1   dexlansoprazole (DEXILANT) 60 MG capsule Take 1 capsule (60 mg total) by mouth daily. 90 capsule 1   irbesartan-hydrochlorothiazide (AVALIDE) 300-12.5 MG tablet TAKE ONE TABLET BY MOUTH DAILY 90 tablet 0   tadalafil (CIALIS) 20 MG tablet TAKE ONE TABLET BY MOUTH DAILY AS NEEDED FOR FOR ERECTILE  DYSFUNCTION 10 tablet 5   Testosterone (ANDROGEL PUMP) 20.25 MG/ACT (1.62%) GEL Place 1 Act onto the skin daily. 225 g 0   No facility-administered medications prior to visit.    ROS Review of Systems  Constitutional:  Negative for chills, diaphoresis, fatigue and fever.  HENT: Negative.  Negative for sore throat and trouble swallowing.   Eyes:  Positive for redness. Negative for photophobia, pain, discharge, itching and visual disturbance.  Respiratory:  Negative for cough, chest tightness, shortness of breath and wheezing.   Cardiovascular:  Negative for chest pain, palpitations and leg swelling.  Gastrointestinal:  Negative for abdominal pain, blood in stool, constipation, diarrhea and vomiting.  Genitourinary: Negative.  Negative for difficulty urinating, dysuria, penile discharge, penile swelling, scrotal swelling, testicular pain and urgency.  Musculoskeletal:  Negative for arthralgias and myalgias.  Skin: Negative.  Negative for color change and rash.  Neurological: Negative.  Negative for dizziness.  Hematological:  Negative for adenopathy. Does not bruise/bleed easily.  Psychiatric/Behavioral:  Negative for decreased concentration, dysphoric mood, sleep disturbance and suicidal ideas. The patient is nervous/anxious.    Objective:  BP 130/82 (BP Location: Left Arm, Patient Position: Sitting, Cuff Size: Large)    Pulse 87    Temp 98.1 F (36.7 C) (Oral)    Resp 16    Ht 5\' 7"  (1.702 m)    Wt 171 lb (77.6 kg)    SpO2 95%  BMI 26.78 kg/m   BP Readings from Last 3 Encounters:  06/08/21 130/82  03/11/21 120/90  12/09/20 132/87    Wt Readings from Last 3 Encounters:  06/08/21 171 lb (77.6 kg)  03/11/21 160 lb (72.6 kg)  12/09/20 172 lb (78 kg)    Physical Exam Vitals reviewed.  Constitutional:      Appearance: He is not ill-appearing.  HENT:     Nose: Nose normal.     Mouth/Throat:     Mouth: Mucous membranes are moist.     Pharynx: No oropharyngeal exudate or  posterior oropharyngeal erythema.  Eyes:     General: No scleral icterus.       Right eye: No discharge or hordeolum.        Left eye: No discharge or hordeolum.     Conjunctiva/sclera:     Right eye: Right conjunctiva is injected. No chemosis, exudate or hemorrhage.    Left eye: Left conjunctiva is injected. No chemosis, exudate or hemorrhage. Cardiovascular:     Rate and Rhythm: Normal rate and regular rhythm.     Heart sounds: No murmur heard. Pulmonary:     Effort: Pulmonary effort is normal.     Breath sounds: No stridor. No wheezing, rhonchi or rales.  Abdominal:     General: Abdomen is flat.     Palpations: There is no mass.     Tenderness: There is no abdominal tenderness. There is no guarding.     Hernia: No hernia is present. There is no hernia in the left inguinal area or right inguinal area.  Genitourinary:    Pubic Area: No rash.      Penis: Normal and circumcised. No erythema, tenderness, discharge, swelling or lesions.      Testes: Normal.        Right: Mass or tenderness not present.        Left: Mass or tenderness not present.     Epididymis:     Right: Normal. Not inflamed or enlarged. No mass.     Left: Normal. Not inflamed or enlarged. No mass.     Prostate: Normal. Not enlarged, not tender and no nodules present.     Rectum: Normal. Guaiac result negative. No mass, tenderness, anal fissure, external hemorrhoid or internal hemorrhoid. Normal anal tone.  Musculoskeletal:        General: Normal range of motion.     Cervical back: Neck supple.     Right lower leg: No edema.     Left lower leg: No edema.  Lymphadenopathy:     Cervical: No cervical adenopathy.     Lower Body: No right inguinal adenopathy. No left inguinal adenopathy.  Skin:    General: Skin is warm and dry.     Coloration: Skin is not pale.     Findings: No rash.  Neurological:     General: No focal deficit present.     Mental Status: He is alert.  Psychiatric:        Mood and Affect: Mood  normal.        Behavior: Behavior normal.    Lab Results  Component Value Date   WBC 6.0 06/08/2021   HGB 14.2 06/08/2021   HCT 41.7 06/08/2021   PLT 259.0 06/08/2021   GLUCOSE 102 (H) 06/08/2021   CHOL 153 06/08/2021   TRIG 95.0 06/08/2021   HDL 50.40 06/08/2021   LDLDIRECT 144.4 01/27/2012   LDLCALC 83 06/08/2021   ALT 31 06/08/2021   AST 21 06/08/2021  NA 140 06/08/2021   K 4.0 06/08/2021   CL 103 06/08/2021   CREATININE 0.91 06/08/2021   BUN 17 06/08/2021   CO2 30 06/08/2021   TSH 3.44 06/08/2021   PSA 1.14 06/08/2021    DG Ribs Unilateral W/Chest Left  Result Date: 04/06/2016 CLINICAL DATA:  Fall 4 days ago with left-sided chest pain EXAM: LEFT RIBS AND CHEST - 3+ VIEW COMPARISON:  01/24/2014 FINDINGS: Cardiac shadow is within normal limits. The lungs are clear bilaterally. No pneumothorax or sizable effusion is noted. Minimal irregularity of the anterior aspect of the left sixth rib is noted which may represent an undisplaced fracture. No other fractures are noted. IMPRESSION: Irregularity of the anterior aspect of the left sixth rib which may represent an undisplaced fracture. Electronically Signed   By: Inez Catalina M.D.   On: 04/06/2016 15:14    Assessment & Plan:   Graison was seen today for annual exam and hypertension.  Diagnoses and all orders for this visit:  Essential hypertension, benign- His blood pressure is adequately well controlled. -     Basic metabolic panel; Future -     TSH; Future -     Urinalysis, Routine w reflex microscopic; Future -     Hepatic function panel; Future -     Hepatic function panel -     Urinalysis, Routine w reflex microscopic -     TSH -     Basic metabolic panel  Vitamin A83 deficiency neuropathy (HCC)- His H&H, B12, and folate are normal. -     Vitamin B12; Future -     CBC with Differential/Platelet; Future -     Folate; Future -     Folate -     CBC with Differential/Platelet -     Vitamin B12  Encounter for  general adult medical examination with abnormal findings- Exam completed, labs reviewed, vaccines reviewed and updated, cancer screenings are up-to-date. -     Lipid panel; Future -     PSA; Future -     HIV Antibody (routine testing w rflx); Future -     HIV Antibody (routine testing w rflx) -     PSA -     Lipid panel  Acute bacterial conjunctivitis of both eyes- He has a history of contact use and so will treat for staph. -     ciprofloxacin (CILOXAN) 0.3 % ophthalmic solution; Place 2 drops into both eyes every 4 (four) hours while awake. Administer 1 drop, every 2 hours, while awake, for 2 days. Then 1 drop, every 4 hours, while awake, for the next 5 days.  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  He will start taking PrEP again. -     RPR; Future -     Hepatitis B surface antigen; Future -     C. trachomatis/N. gonorrhoeae RNA; Future -     C. trachomatis/N. gonorrhoeae RNA -     Hepatitis B surface antigen -     RPR -     emtricitabine-tenofovir AF (DESCOVY) 200-25 MG tablet; Take 1 tablet by mouth daily.  Hypogonadism in male- His testosterone level is in the normal range. -     Testosterone Total,Free,Bio, Males; Future -     Testosterone Total,Free,Bio, Males   I am having Flynt C. Munroe "[name]" start on ciprofloxacin and Descovy. I am also having him maintain his cetirizine, Cholecalciferol, albuterol, ALPRAZolam, dexlansoprazole, carvedilol, tadalafil, atorvastatin, buPROPion ER, Testosterone, and irbesartan-hydrochlorothiazide.  Meds ordered this encounter  Medications   ciprofloxacin (CILOXAN) 0.3 % ophthalmic solution    Sig: Place 2 drops into both eyes every 4 (four) hours while awake. Administer 1 drop, every 2 hours, while awake, for 2 days. Then 1 drop, every 4 hours, while awake, for the next 5 days.    Dispense:  5 mL    Refill:  0   emtricitabine-tenofovir AF (DESCOVY) 200-25 MG tablet    Sig: Take 1 tablet by mouth daily.     Dispense:  90 tablet    Refill:  0     Follow-up: Return in about 3 months (around 09/06/2021).  Scarlette Calico, MD

## 2021-06-09 LAB — TESTOSTERONE TOTAL,FREE,BIO, MALES
Albumin: 4.3 g/dL (ref 3.6–5.1)
Sex Hormone Binding: 34 nmol/L (ref 10–50)
Testosterone, Bioavailable: 212.4 ng/dL (ref 110.0–575.0)
Testosterone, Free: 107.8 pg/mL (ref 46.0–224.0)
Testosterone: 746 ng/dL (ref 250–827)

## 2021-06-09 LAB — RPR: RPR Ser Ql: NONREACTIVE

## 2021-06-09 LAB — HIV ANTIBODY (ROUTINE TESTING W REFLEX): HIV 1&2 Ab, 4th Generation: NONREACTIVE

## 2021-06-09 LAB — HEPATITIS B SURFACE ANTIGEN: Hepatitis B Surface Ag: NONREACTIVE

## 2021-06-09 LAB — C. TRACHOMATIS/N. GONORRHOEAE RNA
C. trachomatis RNA, TMA: NOT DETECTED
N. gonorrhoeae RNA, TMA: NOT DETECTED

## 2021-06-09 MED ORDER — DESCOVY 200-25 MG PO TABS: 1.0000 | ORAL_TABLET | Freq: Every day | ORAL | 0 refills | Status: DC

## 2021-06-12 ENCOUNTER — Encounter: Payer: Self-pay | Admitting: Internal Medicine

## 2021-06-14 ENCOUNTER — Other Ambulatory Visit: Payer: Self-pay | Admitting: Internal Medicine

## 2021-06-14 DIAGNOSIS — F411 Generalized anxiety disorder: Secondary | ICD-10-CM

## 2021-06-16 ENCOUNTER — Other Ambulatory Visit: Payer: Self-pay | Admitting: Internal Medicine

## 2021-06-16 DIAGNOSIS — Z7252 High risk homosexual behavior: Secondary | ICD-10-CM

## 2021-06-16 MED ORDER — EMTRICITABINE-TENOFOVIR DF 200-300 MG PO TABS: 1.0000 | ORAL_TABLET | Freq: Every day | ORAL | 0 refills | Status: DC

## 2021-06-22 ENCOUNTER — Other Ambulatory Visit: Payer: Self-pay | Admitting: Internal Medicine

## 2021-06-22 DIAGNOSIS — E785 Hyperlipidemia, unspecified: Secondary | ICD-10-CM

## 2021-06-23 DIAGNOSIS — F4323 Adjustment disorder with mixed anxiety and depressed mood: Secondary | ICD-10-CM | POA: Diagnosis not present

## 2021-08-17 ENCOUNTER — Encounter: Payer: Self-pay | Admitting: Internal Medicine

## 2021-08-17 ENCOUNTER — Other Ambulatory Visit: Payer: Self-pay | Admitting: Internal Medicine

## 2021-08-17 DIAGNOSIS — K219 Gastro-esophageal reflux disease without esophagitis: Secondary | ICD-10-CM

## 2021-08-17 MED ORDER — DEXLANSOPRAZOLE 60 MG PO CPDR: 60.0000 mg | DELAYED_RELEASE_CAPSULE | Freq: Every day | ORAL | 1 refills | Status: DC

## 2021-08-19 ENCOUNTER — Telehealth: Payer: Self-pay

## 2021-08-19 NOTE — Telephone Encounter (Signed)
Key: [id number] ?

## 2021-08-24 NOTE — Telephone Encounter (Signed)
Per CoverMyMeds:  PA was denied.   Please advise. 

## 2021-08-27 ENCOUNTER — Other Ambulatory Visit: Payer: Self-pay | Admitting: Internal Medicine

## 2021-08-27 DIAGNOSIS — I10 Essential (primary) hypertension: Secondary | ICD-10-CM

## 2021-09-02 ENCOUNTER — Encounter: Payer: Self-pay | Admitting: Internal Medicine

## 2021-09-07 ENCOUNTER — Ambulatory Visit: Payer: BC Managed Care – PPO | Admitting: Internal Medicine

## 2021-09-07 ENCOUNTER — Encounter: Payer: Self-pay | Admitting: Internal Medicine

## 2021-09-07 VITALS — BP 126/86 | HR 94 | Temp 98.9°F | Ht 67.0 in | Wt 169.0 lb

## 2021-09-07 DIAGNOSIS — Z7252 High risk homosexual behavior: Secondary | ICD-10-CM | POA: Diagnosis not present

## 2021-09-07 DIAGNOSIS — I10 Essential (primary) hypertension: Secondary | ICD-10-CM | POA: Diagnosis not present

## 2021-09-07 DIAGNOSIS — A530 Latent syphilis, unspecified as early or late: Secondary | ICD-10-CM | POA: Diagnosis not present

## 2021-09-07 LAB — CBC WITH DIFFERENTIAL/PLATELET
Basophils Absolute: 0 10*3/uL (ref 0.0–0.1)
Basophils Relative: 0.5 % (ref 0.0–3.0)
Eosinophils Absolute: 0.1 10*3/uL (ref 0.0–0.7)
Eosinophils Relative: 1.2 % (ref 0.0–5.0)
HCT: 41.5 % (ref 39.0–52.0)
Hemoglobin: 14.5 g/dL (ref 13.0–17.0)
Lymphocytes Relative: 25.4 % (ref 12.0–46.0)
Lymphs Abs: 1.5 10*3/uL (ref 0.7–4.0)
MCHC: 34.9 g/dL (ref 30.0–36.0)
MCV: 102.8 fl — ABNORMAL HIGH (ref 78.0–100.0)
Monocytes Absolute: 1 10*3/uL (ref 0.1–1.0)
Monocytes Relative: 16.7 % — ABNORMAL HIGH (ref 3.0–12.0)
Neutro Abs: 3.4 10*3/uL (ref 1.4–7.7)
Neutrophils Relative %: 56.2 % (ref 43.0–77.0)
Platelets: 264 10*3/uL (ref 150.0–400.0)
RBC: 4.03 Mil/uL — ABNORMAL LOW (ref 4.22–5.81)
RDW: 13.9 % (ref 11.5–15.5)
WBC: 6.1 10*3/uL (ref 4.0–10.5)

## 2021-09-07 LAB — BASIC METABOLIC PANEL
BUN: 15 mg/dL (ref 6–23)
CO2: 32 mEq/L (ref 19–32)
Calcium: 9.7 mg/dL (ref 8.4–10.5)
Chloride: 99 mEq/L (ref 96–112)
Creatinine, Ser: 1.01 mg/dL (ref 0.40–1.50)
GFR: 84.61 mL/min (ref >60.00)
Glucose, Bld: 96 mg/dL (ref 70–99)
Potassium: 3.9 mEq/L (ref 3.5–5.1)
Sodium: 137 mEq/L (ref 135–145)

## 2021-09-07 MED ORDER — EMTRICITABINE-TENOFOVIR DF 200-300 MG PO TABS: 1.0000 | ORAL_TABLET | Freq: Every day | ORAL | 0 refills | Status: DC

## 2021-09-07 NOTE — Patient Instructions (Signed)
Safe Sex Practicing safe sex means taking steps before and during sex to reduce your risk of: Getting an STI (sexually transmitted infection). Giving your partner an STI. Unwanted or unplanned pregnancy. How to practice safe sex Ways you can practice safe sex  Limit your sexual partners to only one partner who is having sex with only you. Avoid using alcohol and drugs before having sex. Alcohol and drugs can affect your judgment. Before having sex with a new partner: Talk to your partner about past partners, past STIs, and drug use. Get screened for STIs and discuss the results with your partner. Ask your partner to get screened too. Check your body regularly for sores, blisters, rashes, or unusual discharge. If you notice any of these problems, visit your health care provider. Avoid sexual contact if you have symptoms of an infection or you are being treated for an STI. While having sex, use a condom. Make sure to: Use a condom every time you have vaginal, oral, or anal sex. Both females and males should wear condoms during oral sex. Keep condoms in place from the beginning to the end of sexual activity. Use a latex condom, if possible. Latex condoms offer the best protection. Use only water-based lubricants with a condom. Using petroleum-based lubricants or oils will weaken the condom and increase the chance that it will break. Ways your health care provider can help you practice safe sex  See your health care provider for regular screenings, exams, and tests for STIs. Talk with your health care provider about what kind of birth control (contraception) is best for you. Get vaccinated against hepatitis B and human papillomavirus (HPV). If you are at risk of being infected with HIV (human immunodeficiency virus), talk with your health care provider about taking a prescription medicine to prevent HIV infection. You are at risk for HIV if you: Are a man who has sex with other men. Are  sexually active with more than one partner. Take drugs by injection. Have a sex partner who has HIV. Have unprotected sex. Have sex with someone who has sex with both men and women. Have had an STI. Follow these instructions at home: Take over-the-counter and prescription medicines only as told by your health care provider. Keep all follow-up visits. This is important. Where to find more information Centers for Disease Control and Prevention: www.cdc.gov Planned Parenthood: www.plannedparenthood.org Office on Women's Health: www.womenshealth.gov Summary Practicing safe sex means taking steps before and during sex to reduce your risk getting an STI, giving your partner an STI, and having an unwanted or unplanned pregnancy. Before having sex with a new partner, talk to your partner about past partners, past STIs, and drug use. Use a condom every time you have vaginal, oral, or anal sex. Both females and males should wear condoms during oral sex. Check your body regularly for sores, blisters, rashes, or unusual discharge. If you notice any of these problems, visit your health care provider. See your health care provider for regular screenings, exams, and tests for STIs. This information is not intended to replace advice given to you by your health care provider. Make sure you discuss any questions you have with your health care provider. Document Revised: 11/04/2019 Document Reviewed: 11/04/2019 Elsevier Patient Education  2022 [employer] Inc[employer]  

## 2021-09-07 NOTE — Progress Notes (Signed)
? ?Subjective:  ?Patient ID: Caleb Obrien, male    DOB: 05-01-1966  Age: 56 y.o. MRN: [id number] ? ?CC: Hypertension ? ?This visit occurred during the SARS-CoV-2 public health emergency.  Safety protocols were in place, including screening questions prior to the visit, additional usage of staff PPE, and extensive cleaning of exam room while observing appropriate contact time as indicated for disinfecting solutions.   ? ?HPI ?Caleb Obrien presents for f/up - ? ?He is active and denies chest pain, shortness of breath, diaphoresis, dizziness, lightheadedness, or edema. ? ?Outpatient Medications Prior to Visit  ?Medication Sig Dispense Refill  ? albuterol (VENTOLIN HFA) 108 (90 Base) MCG/ACT inhaler Inhale 1-2 puffs into the lungs every 6 (six) hours as needed for wheezing or shortness of breath. 18 g 5  ? ALPRAZolam (XANAX) 0.25 MG tablet TAKE 1/2 TABLET BY MOUTH AS NEEDED 30 tablet 3  ? atorvastatin (LIPITOR) 40 MG tablet TAKE ONE TABLET BY MOUTH DAILY 90 tablet 1  ? carvedilol (COREG) 3.125 MG tablet TAKE ONE TABLET BY MOUTH TWICE A DAY WITH MEALS 180 tablet 1  ? cetirizine (ZYRTEC) 10 MG tablet TAKE 1 TABLET(10 MG) BY MOUTH DAILY 30 tablet 11  ? Cholecalciferol 2000 units TABS Take 1 tablet (2,000 Units total) by mouth daily. 90 tablet 1  ? dexlansoprazole (DEXILANT) 60 MG capsule Take 1 capsule (60 mg total) by mouth daily. 90 capsule 1  ? irbesartan-hydrochlorothiazide (AVALIDE) 300-12.5 MG tablet TAKE ONE TABLET BY MOUTH DAILY 90 tablet 0  ? tadalafil (CIALIS) 20 MG tablet TAKE ONE TABLET BY MOUTH DAILY AS NEEDED FOR FOR ERECTILE DYSFUNCTION 10 tablet 5  ? Testosterone (ANDROGEL PUMP) 20.25 MG/ACT (1.62%) GEL Place 1 Act onto the skin daily. 225 g 0  ? buPROPion ER (WELLBUTRIN SR) 100 MG 12 hr tablet TAKE ONE TABLET BY MOUTH TWICE A DAY 180 tablet 0  ? emtricitabine-tenofovir (TRUVADA) 200-300 MG tablet Take 1 tablet by mouth daily. 90 tablet 0  ? ?No facility-administered medications prior to visit.   ? ? ?ROS ?Review of Systems  ?Constitutional:  Negative for diaphoresis and fatigue.  ?HENT: Negative.  Negative for sore throat and trouble swallowing.   ?Eyes: Negative.   ?Respiratory:  Negative for cough, shortness of breath and wheezing.   ?Cardiovascular:  Negative for chest pain, palpitations and leg swelling.  ?Gastrointestinal:  Negative for abdominal pain, constipation, diarrhea, nausea and vomiting.  ?Endocrine: Negative.   ?Genitourinary: Negative.  Negative for dysuria, genital sores and penile discharge.  ?Musculoskeletal: Negative.  Negative for arthralgias and myalgias.  ?Skin: Negative.  Negative for rash.  ?Neurological: Negative.  Negative for dizziness, weakness, light-headedness and numbness.  ?Hematological:  Negative for adenopathy. Does not bruise/bleed easily.  ?Psychiatric/Behavioral: Negative.    ? ?Objective:  ?BP 126/86 (BP Location: Left Arm, Patient Position: Sitting, Cuff Size: Large)   Pulse 94   Temp 98.9 ?F (37.2 ?C) (Oral)   Ht '5\' 7"'$  (1.702 m)   Wt 169 lb (76.7 kg)   SpO2 96%   BMI 26.47 kg/m?  ? ?BP Readings from Last 3 Encounters:  ?09/07/21 126/86  ?06/08/21 130/82  ?03/11/21 120/90  ? ? ?Wt Readings from Last 3 Encounters:  ?09/07/21 169 lb (76.7 kg)  ?06/08/21 171 lb (77.6 kg)  ?03/11/21 160 lb (72.6 kg)  ? ? ?Physical Exam ?Vitals reviewed.  ?HENT:  ?   Nose: Nose normal.  ?   Mouth/Throat:  ?   Mouth: Mucous membranes are moist.  ?  Pharynx: No oropharyngeal exudate.  ?Eyes:  ?   General: No scleral icterus. ?   Conjunctiva/sclera: Conjunctivae normal.  ?Cardiovascular:  ?   Rate and Rhythm: Normal rate and regular rhythm.  ?   Heart sounds: No murmur heard. ?Pulmonary:  ?   Effort: Pulmonary effort is normal.  ?   Breath sounds: No stridor. No wheezing, rhonchi or rales.  ?Abdominal:  ?   General: Abdomen is flat.  ?   Palpations: There is no mass.  ?   Tenderness: There is no abdominal tenderness. There is no guarding.  ?   Hernia: No hernia is present.   ?Musculoskeletal:  ?   Cervical back: Neck supple.  ?   Right lower leg: No edema.  ?   Left lower leg: No edema.  ?Lymphadenopathy:  ?   Cervical: No cervical adenopathy.  ?Skin: ?   General: Skin is warm and dry.  ?   Findings: No rash.  ?Neurological:  ?   General: No focal deficit present.  ?   Mental Status: He is alert.  ?Psychiatric:     ?   Mood and Affect: Mood normal.     ?   Behavior: Behavior normal.  ? ? ?Lab Results  ?Component Value Date  ? WBC 6.1 09/07/2021  ? HGB 14.5 09/07/2021  ? HCT 41.5 09/07/2021  ? PLT 264.0 09/07/2021  ? GLUCOSE 96 09/07/2021  ? CHOL 153 06/08/2021  ? TRIG 95.0 06/08/2021  ? HDL 50.40 06/08/2021  ? LDLDIRECT 144.4 01/27/2012  ? [place] 83 06/08/2021  ? ALT 31 06/08/2021  ? AST 21 06/08/2021  ? NA 137 09/07/2021  ? K 3.9 09/07/2021  ? CL 99 09/07/2021  ? CREATININE 1.01 09/07/2021  ? BUN 15 09/07/2021  ? CO2 32 09/07/2021  ? TSH 3.44 06/08/2021  ? PSA 1.14 06/08/2021  ? ? ?DG Ribs Unilateral W/Chest Left ? ?Result Date: 04/06/2016 ?CLINICAL DATA:  Fall 4 days ago with left-sided chest pain EXAM: LEFT RIBS AND CHEST - 3+ VIEW COMPARISON:  01/24/2014 FINDINGS: Cardiac shadow is within normal limits. The lungs are clear bilaterally. No pneumothorax or sizable effusion is noted. Minimal irregularity of the anterior aspect of the left sixth rib is noted which may represent an undisplaced fracture. No other fractures are noted. IMPRESSION: Irregularity of the anterior aspect of the left sixth rib which may represent an undisplaced fracture. Electronically Signed   By: Inez Catalina M.D.   On: 04/06/2016 15:14  ? ? ?Assessment & Plan:  ? ?Caleb Obrien was seen today for hypertension. ? ?Diagnoses and all orders for this visit: ? 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 ?-     RPR; Future ?-     HIV Antibody (routine testing w rflx); Future ?-     CBC with Differential/Platelet; Future ?-     CBC with Differential/Platelet ?-     HIV Antibody (routine testing w rflx) ?-     RPR ? ?High risk  homosexual behavior- Labs are normal.  He can continue taking PrEP. ?-     emtricitabine-tenofovir (TRUVADA) 200-300 MG tablet; Take 1 tablet by mouth daily. ?-     Basic metabolic panel; Future ?-     RPR; Future ?-     Hepatitis B surface antigen; Future ?-     HIV Antibody (routine testing w rflx); Future ?-     CBC with Differential/Platelet; Future ?-     CBC with Differential/Platelet ?-     HIV  Antibody (routine testing w rflx) ?-     Hepatitis B surface antigen ?-     RPR ?-     Basic metabolic panel ? ?Essential hypertension, benign- His blood pressure is adequately well controlled.  Electrolytes and renal function are normal. ?-     Basic metabolic panel; Future ?-     CBC with Differential/Platelet; Future ?-     CBC with Differential/Platelet ?-     Basic metabolic panel ? ? ?I am having Kaenan C. Riesen "[name]" maintain his cetirizine, Cholecalciferol, albuterol, ALPRAZolam, carvedilol, tadalafil, Testosterone, atorvastatin, dexlansoprazole, irbesartan-hydrochlorothiazide, and emtricitabine-tenofovir. ? ?Meds ordered this encounter  ?Medications  ? emtricitabine-tenofovir (TRUVADA) 200-300 MG tablet  ?  Sig: Take 1 tablet by mouth daily.  ?  Dispense:  90 tablet  ?  Refill:  0  ? ? ? ?Follow-up: Return in about 3 months (around 12/08/2021). ? ?Scarlette Calico, MD ?

## 2021-09-10 ENCOUNTER — Encounter: Payer: Self-pay | Admitting: Internal Medicine

## 2021-09-19 ENCOUNTER — Other Ambulatory Visit: Payer: Self-pay | Admitting: Internal Medicine

## 2021-09-19 DIAGNOSIS — F411 Generalized anxiety disorder: Secondary | ICD-10-CM

## 2021-09-20 ENCOUNTER — Other Ambulatory Visit: Payer: BC Managed Care – PPO

## 2021-09-20 DIAGNOSIS — Z7252 High risk homosexual behavior: Secondary | ICD-10-CM

## 2021-09-21 LAB — HIV ANTIBODY (ROUTINE TESTING W REFLEX): HIV 1&2 Ab, 4th Generation: NONREACTIVE

## 2021-09-21 LAB — RPR: RPR Ser Ql: NONREACTIVE

## 2021-09-21 LAB — HEPATITIS B SURFACE ANTIGEN: Hepatitis B Surface Ag: NONREACTIVE

## 2021-10-19 ENCOUNTER — Encounter: Payer: Self-pay | Admitting: Internal Medicine

## 2021-10-19 ENCOUNTER — Ambulatory Visit: Payer: BC Managed Care – PPO | Admitting: Internal Medicine

## 2021-10-19 VITALS — BP 128/82 | HR 90 | Temp 98.5°F | Ht 67.0 in | Wt 162.0 lb

## 2021-10-19 DIAGNOSIS — A630 Anogenital (venereal) warts: Secondary | ICD-10-CM | POA: Insufficient documentation

## 2021-10-19 NOTE — Patient Instructions (Signed)
Genital Warts  Genital warts are a common STI (sexually transmitted infection). They may appear as small bumps on the skin of the genital and anal areas. They sometimes become irritated and cause pain. Genital warts are easily passed to other people through sexual contact. Many people do not know that they are infected, and they may be infected for years without symptoms. Even without symptoms, they can pass the infection to their sexual partners. What are the causes? This condition is caused by a virus that is called human papillomavirus (HPV). HPV is spread by having unprotected sex with an infected person. It can be spread through vaginal, anal, and oral sex. What increases the risk? You are more likely to develop this condition if: You have unprotected sex. You have multiple sexual partners. You are sexually active before age 16. You are a man who is not circumcised. You have a male sexual partner who is not circumcised. You have a weakened body defense system (immune system) due to disease or medicine. What are the signs or symptoms? Symptoms of this condition include: Small growths in the genital area or anal area. These warts often grow in clusters. Itching and irritation in the genital area or anal area. Bleeding from the warts. Pain during sex. How is this diagnosed? This condition is diagnosed based on your symptoms and a physical exam. You may also have other tests, including: Biopsy. A tissue sample is removed so it can be checked under a microscope. Colposcopy. In females, a magnifying tool is used to examine the vagina and cervix. Certain solutions may be used to make the HPV cells change color so they can be seen more easily. A Pap test in females. Tests for other STIs. How is this treated? This condition may be treated with: Medicines, such as solutions or creams that are applied to your skin (topical). Procedures, such as: Freezing the warts with liquid nitrogen  (cryotherapy). Burning the warts with a laser or electric probe (electrocautery). Surgery to remove the warts. Getting treatment is important because genital warts can lead to other problems. In females, the virus that causes genital warts may increase the risk for cervical cancer. Follow these instructions at home: Medicines  Apply over-the-counter and prescription medicines only as told by your health care provider. Do not treat genital warts with medicines that are used for treating hand warts. Talk with your health care provider about using over-the-counter anti-itch creams. Instructions for women Get screened regularly for cervical cancer. This type of cancer is slow growing and can almost always be cured if it is found early. If you become pregnant, tell your health care provider that you have had an HPV infection. Your health care provider will monitor you closely during pregnancy. General instructions Do not touch or scratch the warts. Do not have sex until your treatment has been completed. Tell your current and past sexual partners about your condition because they may also need treatment. After treatment, use condoms during sex to prevent future infections. Keep all follow-up visits as told by your health care provider. This is important. How is this prevented? Talk with your health care provider about getting the HPV vaccine. The vaccine: Can prevent some HPV infections and cancers. Is recommended for males and females who are 11-26 years old. Is not recommended for pregnant women. Will not work if you already have HPV. Contact a health care provider if you: Have redness, swelling, or pain in the area of the treated skin. Have a fever.   Feel generally ill. Feel lumps in and around your genital or anal area. Have bleeding in your genital or anal area. Have pain during sex or bleeding after sex. Summary Genital warts are a common STI (sexually transmitted infection). It may  appear as small bumps on the genital and anal areas. This condition is caused by a virus that is called human papillomavirus (HPV). HPV is spread by having unprotected sex with an infected person. It can be spread through vaginal, anal, and oral sex. Treatment is important because genital warts can lead to other problems. In females, the virus that causes genital warts may increase the risk for cervical cancer. This condition may be treated with medicine that is applied to the skin or procedures to remove the warts. The HPV vaccine can prevent some HPV infections and cancers. It is recommended that the vaccine be given to males and females who are 11-26 years old. This information is not intended to replace advice given to you by your health care provider. Make sure you discuss any questions you have with your health care provider. Document Revised: 04/11/2019 Document Reviewed: 04/11/2019 Elsevier Patient Education  2023 [employer]vier Inc.  

## 2021-10-19 NOTE — Progress Notes (Signed)
? ?Subjective:  ?Patient ID: Caleb Obrien, male    DOB: 12-17-1965  Age: 56 y.o. MRN: [id number] ? ?CC: Genital Warts ? ? ?HPI ?Caleb Obrien presents for f/up ? ?He has a wart on his scrotum. ? ?Outpatient Medications Prior to Visit  ?Medication Sig Dispense Refill  ? albuterol (VENTOLIN HFA) 108 (90 Base) MCG/ACT inhaler Inhale 1-2 puffs into the lungs every 6 (six) hours as needed for wheezing or shortness of breath. 18 g 5  ? ALPRAZolam (XANAX) 0.25 MG tablet TAKE 1/2 TABLET BY MOUTH AS NEEDED 30 tablet 3  ? atorvastatin (LIPITOR) 40 MG tablet TAKE ONE TABLET BY MOUTH DAILY 90 tablet 1  ? buPROPion ER (WELLBUTRIN SR) 100 MG 12 hr tablet TAKE ONE TABLET BY MOUTH TWICE A DAY 180 tablet 0  ? carvedilol (COREG) 3.125 MG tablet TAKE ONE TABLET BY MOUTH TWICE A DAY WITH MEALS 180 tablet 1  ? cetirizine (ZYRTEC) 10 MG tablet TAKE 1 TABLET(10 MG) BY MOUTH DAILY 30 tablet 11  ? Cholecalciferol 2000 units TABS Take 1 tablet (2,000 Units total) by mouth daily. 90 tablet 1  ? dexlansoprazole (DEXILANT) 60 MG capsule Take 1 capsule (60 mg total) by mouth daily. 90 capsule 1  ? emtricitabine-tenofovir (TRUVADA) 200-300 MG tablet Take 1 tablet by mouth daily. 90 tablet 0  ? irbesartan-hydrochlorothiazide (AVALIDE) 300-12.5 MG tablet TAKE ONE TABLET BY MOUTH DAILY 90 tablet 0  ? tadalafil (CIALIS) 20 MG tablet TAKE ONE TABLET BY MOUTH DAILY AS NEEDED FOR FOR ERECTILE DYSFUNCTION 10 tablet 5  ? Testosterone (ANDROGEL PUMP) 20.25 MG/ACT (1.62%) GEL Place 1 Act onto the skin daily. 225 g 0  ? ?No facility-administered medications prior to visit.  ? ? ?ROS ?Review of Systems  ?All other systems reviewed and are negative. ? ?Objective:  ?BP 128/82 (BP Location: Right Arm, Patient Position: Sitting, Cuff Size: Large)   Pulse 90   Temp 98.5 ?F (36.9 ?C) (Oral)   Ht '5\' 7"'$  (1.702 m)   Wt 162 lb (73.5 kg)   SpO2 96%   BMI 25.37 kg/m?  ? ?BP Readings from Last 3 Encounters:  ?10/19/21 128/82  ?09/07/21 126/86  ?06/08/21  130/82  ? ? ?Wt Readings from Last 3 Encounters:  ?10/19/21 162 lb (73.5 kg)  ?09/07/21 169 lb (76.7 kg)  ?06/08/21 171 lb (77.6 kg)  ? ? ?Physical Exam ?Vitals reviewed.  ?Genitourinary: ? ? ?Neurological:  ?   Mental Status: He is alert.  ? ? ?Lab Results  ?Component Value Date  ? WBC 6.1 09/07/2021  ? HGB 14.5 09/07/2021  ? HCT 41.5 09/07/2021  ? PLT 264.0 09/07/2021  ? GLUCOSE 96 09/07/2021  ? CHOL 153 06/08/2021  ? TRIG 95.0 06/08/2021  ? HDL 50.40 06/08/2021  ? LDLDIRECT 144.4 01/27/2012  ? Caleb Obrien 83 06/08/2021  ? ALT 31 06/08/2021  ? AST 21 06/08/2021  ? NA 137 09/07/2021  ? K 3.9 09/07/2021  ? CL 99 09/07/2021  ? CREATININE 1.01 09/07/2021  ? BUN 15 09/07/2021  ? CO2 32 09/07/2021  ? TSH 3.44 06/08/2021  ? PSA 1.14 06/08/2021  ? ? ?DG Ribs Unilateral W/Chest Left ? ?Result Date: 04/06/2016 ?CLINICAL DATA:  Fall 4 days ago with left-sided chest pain EXAM: LEFT RIBS AND CHEST - 3+ VIEW COMPARISON:  01/24/2014 FINDINGS: Cardiac shadow is within normal limits. The lungs are clear bilaterally. No pneumothorax or sizable effusion is noted. Minimal irregularity of the anterior aspect of the left sixth rib is noted which  may represent an undisplaced fracture. No other fractures are noted. IMPRESSION: Irregularity of the anterior aspect of the left sixth rib which may represent an undisplaced fracture. Electronically Signed   By: Caleb Obrien M.D.   On: 04/06/2016 15:14  ? ?Liquid nitrogen was applied for 15 seconds to the lesions for 5 cycles of freeze and thaw. The expected blistering or scabbing reaction explained. Return if lesions fail to fully resolve.  ? ?Assessment & Plan:  ? ?Caleb Obrien was seen today for genital warts. ? ?Diagnoses and all orders for this visit: ? ?Warts, genital- Treated with cryotherapy. ? ? ?I am having Caleb Obrien "Caleb Obrien" maintain his cetirizine, Cholecalciferol, albuterol, ALPRAZolam, carvedilol, tadalafil, Testosterone, atorvastatin, dexlansoprazole, irbesartan-hydrochlorothiazide,  emtricitabine-tenofovir, and buPROPion ER. ? ?No orders of the defined types were placed in this encounter. ? ? ? ?Follow-up: No follow-ups on file. ? ?Caleb Calico, MD ?

## 2021-10-25 ENCOUNTER — Other Ambulatory Visit: Payer: Self-pay | Admitting: Internal Medicine

## 2021-10-25 DIAGNOSIS — F418 Other specified anxiety disorders: Secondary | ICD-10-CM

## 2021-11-13 ENCOUNTER — Encounter: Payer: Self-pay | Admitting: Internal Medicine

## 2021-11-15 ENCOUNTER — Other Ambulatory Visit: Payer: Self-pay | Admitting: Internal Medicine

## 2021-11-15 DIAGNOSIS — E291 Testicular hypofunction: Secondary | ICD-10-CM

## 2021-11-15 MED ORDER — TESTOSTERONE 20.25 MG/ACT (1.62%) TD GEL: 1.0000 | Freq: Every day | TRANSDERMAL | 0 refills | Status: DC

## 2021-11-19 ENCOUNTER — Encounter: Payer: Self-pay | Admitting: Internal Medicine

## 2021-11-19 ENCOUNTER — Other Ambulatory Visit: Payer: Self-pay | Admitting: Internal Medicine

## 2021-11-19 DIAGNOSIS — I10 Essential (primary) hypertension: Secondary | ICD-10-CM

## 2021-12-01 ENCOUNTER — Other Ambulatory Visit: Payer: Self-pay | Admitting: Internal Medicine

## 2021-12-01 DIAGNOSIS — N522 Drug-induced erectile dysfunction: Secondary | ICD-10-CM

## 2021-12-16 ENCOUNTER — Encounter: Payer: Self-pay | Admitting: Internal Medicine

## 2021-12-16 ENCOUNTER — Other Ambulatory Visit: Payer: Self-pay | Admitting: Internal Medicine

## 2021-12-16 DIAGNOSIS — Z7252 High risk homosexual behavior: Secondary | ICD-10-CM

## 2021-12-16 MED ORDER — EMTRICITABINE-TENOFOVIR DF 200-300 MG PO TABS: 1.0000 | ORAL_TABLET | Freq: Every day | ORAL | 0 refills | Status: DC

## 2021-12-22 ENCOUNTER — Telehealth: Payer: Self-pay | Admitting: *Deleted

## 2021-12-22 NOTE — Progress Notes (Unsigned)
PATIENT: Caleb Obrien DOB: 04/02/1966  REASON FOR VISIT: follow up HISTORY FROM: patient PRIMARY NEUROLOGIST:   Virtual Visit via Video Note  I connected with Caleb Obrien on 12/23/21 at  8:00 AM EDT by a video enabled telemedicine application located remotely at [facility] and verified that I am speaking with the correct person using two identifiers who was located at their own home.   I discussed the limitations of evaluation and management by telemedicine and the availability of in person appointments. The patient expressed understanding and agreed to proceed.   PATIENT: Caleb Obrien DOB: 1966-04-07  REASON FOR VISIT: follow up HISTORY FROM: patient  HISTORY OF PRESENT ILLNESS: Today 12/23/21:    Ms. Boomer is a 56 year old \\male  with a history of obstructive sleep apnea on CPAP.  He returns today for follow-up.  His download is below.  He does state that he missed using it for several days as he forgot to take it when he went out of town.  He does state that he has a very old machine.  He would like to get a new machine if possible.  He is also not happy with his current DME company and would like to switch to a different DME.   REVIEW OF SYSTEMS: Out of a complete 14 system review of symptoms, the patient complains only of the following symptoms, and all other reviewed systems are negative.  ALLERGIES: Allergies  Allergen Reactions   Amlodipine     cough   Lisinopril     cough   Sudafed [Pseudoephedrine Hcl] Other (See Comments)    prostatism    HOME MEDICATIONS: Outpatient Medications Prior to Visit  Medication Sig Dispense Refill   albuterol (VENTOLIN HFA) 108 (90 Base) MCG/ACT inhaler Inhale 1-2 puffs into the lungs every 6 (six) hours as needed for wheezing or shortness of breath. 18 g 5   ALPRAZolam (XANAX) 0.25 MG tablet TAKE ONE HALF TABLET BY MOUTH AS NEEDED 30 tablet 2   atorvastatin (LIPITOR) 40 MG tablet TAKE  ONE TABLET BY MOUTH DAILY 90 tablet 1   buPROPion ER (WELLBUTRIN SR) 100 MG 12 hr tablet TAKE ONE TABLET BY MOUTH TWICE A DAY 180 tablet 0   carvedilol (COREG) 3.125 MG tablet TAKE ONE TABLET BY MOUTH TWICE A DAY WITH MEALS 180 tablet 1   cetirizine (ZYRTEC) 10 MG tablet TAKE 1 TABLET(10 MG) BY MOUTH DAILY 30 tablet 11   Cholecalciferol 2000 units TABS Take 1 tablet (2,000 Units total) by mouth daily. 90 tablet 1   dexlansoprazole (DEXILANT) 60 MG capsule Take 1 capsule (60 mg total) by mouth daily. 90 capsule 1   emtricitabine-tenofovir (TRUVADA) 200-300 MG tablet Take 1 tablet by mouth daily. 90 tablet 0   irbesartan-hydrochlorothiazide (AVALIDE) 300-12.5 MG tablet TAKE ONE TABLET BY MOUTH DAILY 90 tablet 0   tadalafil (CIALIS) 20 MG tablet TAKE ONE TABLET BY MOUTH DAILY AS NEEDED FOR ERECTILE DYSFUNCTION 30 tablet 1   Testosterone (ANDROGEL PUMP) 20.25 MG/ACT (1.62%) GEL Place 1 Act onto the skin daily. 225 g 0   No facility-administered medications prior to visit.    PAST MEDICAL HISTORY: Past Medical History:  Diagnosis Date   ALLERGIC RHINITIS CAUSE UNSPECIFIED 02/24/2009   Allergy    ASTHMA 02/05/2007   BENIGN PROSTATIC HYPERTROPHY, WITH URINARY OBSTRUCTION 05/22/2007   Dermatophytosis of foot 11/02/2009   GANGLION CYST, WRIST, LEFT 03/25/2009   Hemangioma of skin and subcutaneous tissue 06/30/2009  HEPATITIS B, HX OF 02/05/2007   per pt no history   HYPERLIPIDEMIA, BORDERLINE 04/21/2008   PLANTAR WART, RIGHT 01/23/2009   Sleep apnea    uses c-pap    PAST SURGICAL HISTORY: Past Surgical History:  Procedure Laterality Date   eyelid surgery  04/11/2018   [facility]   WISDOM TOOTH EXTRACTION      FAMILY HISTORY: Family History  Problem Relation Age of Onset   Hypertension Mother    Arthritis Mother    Prostate cancer Father    Healthy Sister    Colon cancer Neg Hx    Esophageal cancer Neg Hx    Stomach cancer Neg Hx    Rectal cancer Neg Hx     SOCIAL HISTORY: Social  History   Socioeconomic History   Marital status: Single    Spouse name: Not on file   Number of children: Not on file   Years of education: Not on file   Highest education level: Not on file  Occupational History   Occupation: HR    Employer: [employer]  Tobacco Use   Smoking status: Former    Packs/day: 0.30    Types: Cigarettes    Quit date: 10/2017    Years since quitting: 4.2   Smokeless tobacco: Never  Vaping Use   Vaping Use: Never used  Substance and Sexual Activity   Alcohol use: Yes    Alcohol/week: 7.0 standard drinks of alcohol    Types: 7 Standard drinks or equivalent per week   Drug use: No   Sexual activity: Yes  Other Topics Concern   Not on file  Social History Narrative   Not on file   Social Determinants of Health   Financial Resource Strain: Not on file  Food Insecurity: Not on file  Transportation Needs: Not on file  Physical Activity: Not on file  Stress: Not on file  Social Connections: Not on file  Intimate Partner Violence: Not on file      PHYSICAL EXAM Generalized: Well developed, in no acute distress   Neurological examination  Mentation: Alert oriented to time, place, history taking. Follows all commands speech and language fluent Cranial nerve II-XII:Extraocular movements were full. Facial symmetry noted. uvula tongue midline. Head turning and shoulder shrug  were normal and symmetric. Motor: Good strength throughout subjectively per patient Sensory: Sensory testing is intact to soft touch on all 4 extremities subjectively per patient Coordination: Cerebellar testing reveals good finger-nose-finger  Gait and station: Patient is able to stand from a seated position. gait is normal.  Reflexes: UTA  DIAGNOSTIC DATA (LABS, IMAGING, TESTING) - I reviewed patient records, labs, notes, testing and imaging myself where available.  Lab Results  Component Value Date   WBC 6.1 09/07/2021   HGB 14.5 09/07/2021   HCT 41.5  09/07/2021   MCV 102.8 (H) 09/07/2021   PLT 264.0 09/07/2021      Component Value Date/Time   NA 137 09/07/2021 1134   K 3.9 09/07/2021 1134   CL 99 09/07/2021 1134   CO2 32 09/07/2021 1134   GLUCOSE 96 09/07/2021 1134   BUN 15 09/07/2021 1134   CREATININE 1.01 09/07/2021 1134   CALCIUM 9.7 09/07/2021 1134   PROT 6.6 06/08/2021 0855   ALBUMIN 4.3 06/08/2021 0855   AST 21 06/08/2021 0855   ALT 31 06/08/2021 0855   ALKPHOS 65 06/08/2021 0855   BILITOT 0.6 06/08/2021 0855   GFRNONAA 77.08 01/18/2010 0755   GFRAA 95 01/10/2008 0931  Lab Results  Component Value Date   CHOL 153 06/08/2021   HDL 50.40 06/08/2021   LDLCALC 83 06/08/2021   LDLDIRECT 144.4 01/27/2012   TRIG 95.0 06/08/2021   CHOLHDL 3 06/08/2021   No results found for: "HGBA1C" Lab Results  Component Value Date   VITAMINB12 608 06/08/2021   Lab Results  Component Value Date   TSH 3.44 06/08/2021      ASSESSMENT AND PLAN 56 y.o. year old male  has a past medical history of ALLERGIC RHINITIS CAUSE UNSPECIFIED (02/24/2009), Allergy, ASTHMA (02/05/2007), BENIGN PROSTATIC HYPERTROPHY, WITH URINARY OBSTRUCTION (05/22/2007), Dermatophytosis of foot (11/02/2009), GANGLION CYST, WRIST, LEFT (03/25/2009), Hemangioma of skin and subcutaneous tissue (06/30/2009), HEPATITIS B, HX OF (02/05/2007), HYPERLIPIDEMIA, BORDERLINE (04/21/2008), PLANTAR WART, RIGHT (01/23/2009), and Sleep apnea. here with:  OSA on CPAP  CPAP compliance excellent Residual AHI is good Encouraged patient to continue using CPAP nightly and > 4 hours each night Home sleep test ordered.  Pending results we will order new machine Patient would like to switch to a different DME company order sent to switch to [facility] F/U in 1 year or sooner if needed    Butch Penny, MSN, NP-C 12/23/2021, 8:07 AM [facility] [address], [address] [place], [place] [address] [phone]

## 2021-12-23 ENCOUNTER — Telehealth (INDEPENDENT_AMBULATORY_CARE_PROVIDER_SITE_OTHER): Payer: BC Managed Care – PPO | Admitting: Adult Health

## 2021-12-23 DIAGNOSIS — Z9989 Dependence on other enabling machines and devices: Secondary | ICD-10-CM | POA: Diagnosis not present

## 2021-12-23 DIAGNOSIS — G4733 Obstructive sleep apnea (adult) (pediatric): Secondary | ICD-10-CM

## 2021-12-23 NOTE — Telephone Encounter (Signed)
Sent ESS to pt to complete for appt (mychart VV).

## 2021-12-23 NOTE — Progress Notes (Signed)
Fax confirmation received for new order, change DME to [facility]. [phone].

## 2021-12-24 ENCOUNTER — Encounter: Payer: Self-pay | Admitting: Adult Health

## 2021-12-24 DIAGNOSIS — G4733 Obstructive sleep apnea (adult) (pediatric): Secondary | ICD-10-CM

## 2021-12-25 ENCOUNTER — Other Ambulatory Visit: Payer: Self-pay | Admitting: Internal Medicine

## 2021-12-25 DIAGNOSIS — F411 Generalized anxiety disorder: Secondary | ICD-10-CM

## 2021-12-25 DIAGNOSIS — E785 Hyperlipidemia, unspecified: Secondary | ICD-10-CM

## 2021-12-27 NOTE — Telephone Encounter (Signed)
Are you ok if this patient gets a new machine without HST since cost is an issue

## 2021-12-28 NOTE — Telephone Encounter (Signed)
Spoke with Dr. Brett Fairy she is ok with doing ONO on cpap and then ordering a new machine.

## 2022-01-11 ENCOUNTER — Encounter: Payer: Self-pay | Admitting: Internal Medicine

## 2022-01-11 ENCOUNTER — Ambulatory Visit: Payer: BC Managed Care – PPO | Admitting: Internal Medicine

## 2022-01-11 VITALS — BP 132/86 | HR 90 | Temp 98.0°F | Ht 67.0 in | Wt 165.0 lb

## 2022-01-11 DIAGNOSIS — F411 Generalized anxiety disorder: Secondary | ICD-10-CM

## 2022-01-11 DIAGNOSIS — Z202 Contact with and (suspected) exposure to infections with a predominantly sexual mode of transmission: Secondary | ICD-10-CM

## 2022-01-11 DIAGNOSIS — A630 Anogenital (venereal) warts: Secondary | ICD-10-CM

## 2022-01-11 DIAGNOSIS — Z7252 High risk homosexual behavior: Secondary | ICD-10-CM | POA: Diagnosis not present

## 2022-01-11 DIAGNOSIS — Z720 Tobacco use: Secondary | ICD-10-CM

## 2022-01-11 DIAGNOSIS — I10 Essential (primary) hypertension: Secondary | ICD-10-CM

## 2022-01-11 LAB — BASIC METABOLIC PANEL
BUN: 14 mg/dL (ref 6–23)
CO2: 29 mEq/L (ref 19–32)
Calcium: 9.3 mg/dL (ref 8.4–10.5)
Chloride: 100 mEq/L (ref 96–112)
Creatinine, Ser: 0.99 mg/dL (ref 0.40–1.50)
GFR: 86.46 mL/min (ref >60.00)
Glucose, Bld: 88 mg/dL (ref 70–99)
Potassium: 3.8 mEq/L (ref 3.5–5.1)
Sodium: 137 mEq/L (ref 135–145)

## 2022-01-11 MED ORDER — IRBESARTAN-HYDROCHLOROTHIAZIDE 300-12.5 MG PO TABS
1.0000 | ORAL_TABLET | Freq: Every day | ORAL | 0 refills | Status: DC
Start: 2022-01-11 — End: 2022-03-04

## 2022-01-11 MED ORDER — BUPROPION HCL ER (SR) 100 MG PO TB12: 100.0000 mg | ORAL_TABLET | Freq: Two times a day (BID) | ORAL | 0 refills | Status: DC

## 2022-01-11 MED ORDER — CARVEDILOL 3.125 MG PO TABS: 3.1250 mg | ORAL_TABLET | Freq: Two times a day (BID) | ORAL | 0 refills | Status: DC

## 2022-01-11 NOTE — Progress Notes (Signed)
Subjective:  Patient ID: Caleb Obrien, male    DOB: 10/12/1965  Age: 56 y.o. MRN: [id number]  CC: Hypertension   HPI [name] presents for f/up -  He has another wart in his perineum that he would like to have frozen.  He is also concerned that he was recently exposed to an STD.  He is active and denies chest pain, shortness of breath, diaphoresis, or edema.  Outpatient Medications Prior to Visit  Medication Sig Dispense Refill   albuterol (VENTOLIN HFA) 108 (90 Base) MCG/ACT inhaler Inhale 1-2 puffs into the lungs every 6 (six) hours as needed for wheezing or shortness of breath. 18 g 5   ALPRAZolam (XANAX) 0.25 MG tablet TAKE ONE HALF TABLET BY MOUTH AS NEEDED 30 tablet 2   atorvastatin (LIPITOR) 40 MG tablet TAKE ONE TABLET BY MOUTH DAILY 90 tablet 1   cetirizine (ZYRTEC) 10 MG tablet TAKE 1 TABLET(10 MG) BY MOUTH DAILY 30 tablet 11   Cholecalciferol 2000 units TABS Take 1 tablet (2,000 Units total) by mouth daily. 90 tablet 1   dexlansoprazole (DEXILANT) 60 MG capsule Take 1 capsule (60 mg total) by mouth daily. 90 capsule 1   emtricitabine-tenofovir (TRUVADA) 200-300 MG tablet Take 1 tablet by mouth daily. 90 tablet 0   tadalafil (CIALIS) 20 MG tablet TAKE ONE TABLET BY MOUTH DAILY AS NEEDED FOR ERECTILE DYSFUNCTION 30 tablet 1   Testosterone (ANDROGEL PUMP) 20.25 MG/ACT (1.62%) GEL Place 1 Act onto the skin daily. 225 g 0   buPROPion ER (WELLBUTRIN SR) 100 MG 12 hr tablet TAKE ONE TABLET BY MOUTH TWICE A DAY 180 tablet 0   carvedilol (COREG) 3.125 MG tablet TAKE ONE TABLET BY MOUTH TWICE A DAY WITH MEALS 180 tablet 1   irbesartan-hydrochlorothiazide (AVALIDE) 300-12.5 MG tablet TAKE ONE TABLET BY MOUTH DAILY 90 tablet 0   No facility-administered medications prior to visit.    ROS Review of Systems  Constitutional:  Negative for diaphoresis and fatigue.  HENT: Negative.    Eyes: Negative.   Respiratory:  Negative for cough, chest tightness, shortness of breath  and wheezing.   Cardiovascular:  Negative for chest pain, palpitations and leg swelling.  Gastrointestinal:  Negative for abdominal pain.  Endocrine: Negative.   Genitourinary: Negative.  Negative for difficulty urinating, dysuria, hematuria, penile discharge, scrotal swelling and testicular pain.  Musculoskeletal: Negative.  Negative for arthralgias and myalgias.  Skin:  Negative for color change and rash.  Neurological: Negative.  Negative for dizziness, weakness and light-headedness.  Hematological:  Negative for adenopathy. Does not bruise/bleed easily.  Psychiatric/Behavioral:  The patient is nervous/anxious.     Objective:  BP 132/86 (BP Location: Left Arm, Patient Position: Sitting, Cuff Size: Large)   Pulse 90   Temp 98 F (36.7 C) (Oral)   Ht '5\' 7"'$  (1.702 m)   Wt 165 lb (74.8 kg)   SpO2 97%   BMI 25.84 kg/m   BP Readings from Last 3 Encounters:  01/11/22 132/86  10/19/21 128/82  09/07/21 126/86    Wt Readings from Last 3 Encounters:  01/11/22 165 lb (74.8 kg)  10/19/21 162 lb (73.5 kg)  09/07/21 169 lb (76.7 kg)    Physical Exam Vitals reviewed.  HENT:     Mouth/Throat:     Mouth: Mucous membranes are moist.  Eyes:     General: No scleral icterus.    Pupils: Pupils are equal, round, and reactive to light.  Cardiovascular:     Rate and  Rhythm: Normal rate and regular rhythm.     Heart sounds: No murmur heard. Pulmonary:     Effort: Pulmonary effort is normal.     Breath sounds: No stridor. No wheezing, rhonchi or rales.  Abdominal:     General: Abdomen is flat.     Tenderness: There is no abdominal tenderness. There is no rebound.     Hernia: No hernia is present.  Genitourinary:   Musculoskeletal:        General: Normal range of motion.     Cervical back: Neck supple.     Right lower leg: No edema.     Left lower leg: No edema.  Lymphadenopathy:     Cervical: No cervical adenopathy.  Skin:    General: Skin is warm and dry.  Neurological:      General: No focal deficit present.     Mental Status: He is alert.  Psychiatric:        Mood and Affect: Mood normal.        Behavior: Behavior normal.     Lab Results  Component Value Date   WBC 6.1 09/07/2021   HGB 14.5 09/07/2021   HCT 41.5 09/07/2021   PLT 264.0 09/07/2021   GLUCOSE 88 01/11/2022   CHOL 153 06/08/2021   TRIG 95.0 06/08/2021   HDL 50.40 06/08/2021   LDLDIRECT 144.4 01/27/2012   LDLCALC 83 06/08/2021   ALT 31 06/08/2021   AST 21 06/08/2021   NA 137 01/11/2022   K 3.8 01/11/2022   CL 100 01/11/2022   CREATININE 0.99 01/11/2022   BUN 14 01/11/2022   CO2 29 01/11/2022   TSH 3.44 06/08/2021   PSA 1.14 06/08/2021    DG Ribs Unilateral W/Chest Left  Result Date: 04/06/2016 CLINICAL DATA:  Fall 4 days ago with left-sided chest pain EXAM: LEFT RIBS AND CHEST - 3+ VIEW COMPARISON:  01/24/2014 FINDINGS: Cardiac shadow is within normal limits. The lungs are clear bilaterally. No pneumothorax or sizable effusion is noted. Minimal irregularity of the anterior aspect of the left sixth rib is noted which may represent an undisplaced fracture. No other fractures are noted. IMPRESSION: Irregularity of the anterior aspect of the left sixth rib which may represent an undisplaced fracture. Electronically Signed   By: Inez Catalina M.D.   On: 04/06/2016 15:14    Assessment & Plan:   Beauregard was seen today for hypertension.  Diagnoses and all orders for this visit:  Warts, genital- Treated with cryotherapy.  Essential hypertension, benign- His blood pressure is adequately well controlled. -     carvedilol (COREG) 3.125 MG tablet; Take 1 tablet (3.125 mg total) by mouth 2 (two) times daily with a meal. -     irbesartan-hydrochlorothiazide (AVALIDE) 300-12.5 MG tablet; Take 1 tablet by mouth daily. -     Basic metabolic panel; Future -     Urinalysis, Routine w reflex microscopic; Future -     Urinalysis, Routine w reflex microscopic -     Basic metabolic panel  GAD  (generalized anxiety disorder) -     buPROPion ER (WELLBUTRIN SR) 100 MG 12 hr tablet; Take 1 tablet (100 mg total) by mouth 2 (two) times daily.  STD exposure -     Urinalysis, Routine w reflex microscopic; Future -     C. trachomatis/N. gonorrhoeae RNA; Future -     HIV Antibody (routine testing w rflx); Future -     RPR; Future -     Hepatitis B surface antigen;  Future -     Hepatitis B surface antigen -     RPR -     HIV Antibody (routine testing w rflx) -     C. trachomatis/N. gonorrhoeae RNA -     Urinalysis, Routine w reflex microscopic  High risk homosexual behavior- Labs are negative for infection.  He can continue taking PrEP. -     Urinalysis, Routine w reflex microscopic; Future -     C. trachomatis/N. gonorrhoeae RNA; Future -     HIV Antibody (routine testing w rflx); Future -     RPR; Future -     Hepatitis B surface antigen; Future -     Hepatitis B surface antigen -     RPR -     HIV Antibody (routine testing w rflx) -     C. trachomatis/N. gonorrhoeae RNA -     Urinalysis, Routine w reflex microscopic  Tobacco abuse -     Ambulatory Referral for Lung Cancer Scre   I have changed Akili C. Quintin "Chris"'s carvedilol, buPROPion ER, and irbesartan-hydrochlorothiazide. I am also having him maintain his cetirizine, Cholecalciferol, albuterol, dexlansoprazole, ALPRAZolam, Testosterone, tadalafil, emtricitabine-tenofovir, and atorvastatin.  Meds ordered this encounter  Medications   carvedilol (COREG) 3.125 MG tablet    Sig: Take 1 tablet (3.125 mg total) by mouth 2 (two) times daily with a meal.    Dispense:  180 tablet    Refill:  0   buPROPion ER (WELLBUTRIN SR) 100 MG 12 hr tablet    Sig: Take 1 tablet (100 mg total) by mouth 2 (two) times daily.    Dispense:  180 tablet    Refill:  0   irbesartan-hydrochlorothiazide (AVALIDE) 300-12.5 MG tablet    Sig: Take 1 tablet by mouth daily.    Dispense:  90 tablet    Refill:  0   Liquid nitrogen was applied  for 20 seconds to the skin lesion (3 cycles applied). He tolerated this well.   Follow-up: Return in about 3 months (around 04/13/2022).  Scarlette Calico, MD

## 2022-01-11 NOTE — Patient Instructions (Signed)
Safe Sex Practicing safe sex means taking steps before and during sex to reduce your risk of: Getting an STI (sexually transmitted infection). Giving your partner an STI. Unwanted or unplanned pregnancy. How to practice safe sex Ways you can practice safe sex  Limit your sexual partners to only one partner who is having sex with only you. Avoid using alcohol and drugs before having sex. Alcohol and drugs can affect your judgment. Before having sex with a new partner: Talk to your partner about past partners, past STIs, and drug use. Get screened for STIs and discuss the results with your partner. Ask your partner to get screened too. Check your body regularly for sores, blisters, rashes, or unusual discharge. If you notice any of these problems, visit your health care provider. Avoid sexual contact if you have symptoms of an infection or you are being treated for an STI. While having sex, use a condom. Make sure to: Use a condom every time you have vaginal, oral, or anal sex. Both females and males should wear condoms during oral sex. Keep condoms in place from the beginning to the end of sexual activity. Use a latex condom, if possible. Latex condoms offer the best protection. Use only water-based lubricants with a condom. Using petroleum-based lubricants or oils will weaken the condom and increase the chance that it will break. Ways your health care provider can help you practice safe sex  See your health care provider for regular screenings, exams, and tests for STIs. Talk with your health care provider about what kind of birth control (contraception) is best for you. Get vaccinated against hepatitis B and human papillomavirus (HPV). If you are at risk of being infected with HIV (human immunodeficiency virus), talk with your health care provider about taking a prescription medicine to prevent HIV infection. You are at risk for HIV if you: Are a man who has sex with other men. Are  sexually active with more than one partner. Take drugs by injection. Have a sex partner who has HIV. Have unprotected sex. Have sex with someone who has sex with both men and women. Have had an STI. Follow these instructions at home: Take over-the-counter and prescription medicines only as told by your health care provider. Keep all follow-up visits. This is important. Where to find more information Centers for Disease Control and Prevention: www.cdc.gov Planned Parenthood: www.plannedparenthood.org Office on Women's Health: www.womenshealth.gov Summary Practicing safe sex means taking steps before and during sex to reduce your risk getting an STI, giving your partner an STI, and having an unwanted or unplanned pregnancy. Before having sex with a new partner, talk to your partner about past partners, past STIs, and drug use. Use a condom every time you have vaginal, oral, or anal sex. Both females and males should wear condoms during oral sex. Check your body regularly for sores, blisters, rashes, or unusual discharge. If you notice any of these problems, visit your health care provider. See your health care provider for regular screenings, exams, and tests for STIs. This information is not intended to replace advice given to you by your health care provider. Make sure you discuss any questions you have with your health care provider. Document Revised: 11/04/2019 Document Reviewed: 11/04/2019 Elsevier Patient Education  2023 Elsevier Inc.  

## 2022-01-12 LAB — RPR: RPR Ser Ql: NONREACTIVE

## 2022-01-12 LAB — C. TRACHOMATIS/N. GONORRHOEAE RNA
C. trachomatis RNA, TMA: NOT DETECTED
N. gonorrhoeae RNA, TMA: NOT DETECTED

## 2022-01-12 LAB — HEPATITIS B SURFACE ANTIGEN: Hepatitis B Surface Ag: NONREACTIVE

## 2022-01-12 LAB — HIV ANTIBODY (ROUTINE TESTING W REFLEX): HIV 1&2 Ab, 4th Generation: NONREACTIVE

## 2022-01-13 NOTE — Telephone Encounter (Signed)
ONO while on CPAP ordered. Order pending co-sign by Jinny Blossom NP then will fax to [facility].

## 2022-01-21 IMAGING — DX DG SHOULDER 2+V*L*
3 series · 3 of 3 positions shown · non-contrast
Comparison: None.

CLINICAL DATA: Pain

EXAM:
LEFT SHOULDER - 2+ VIEW

[shoulder ap (1 of 2)]
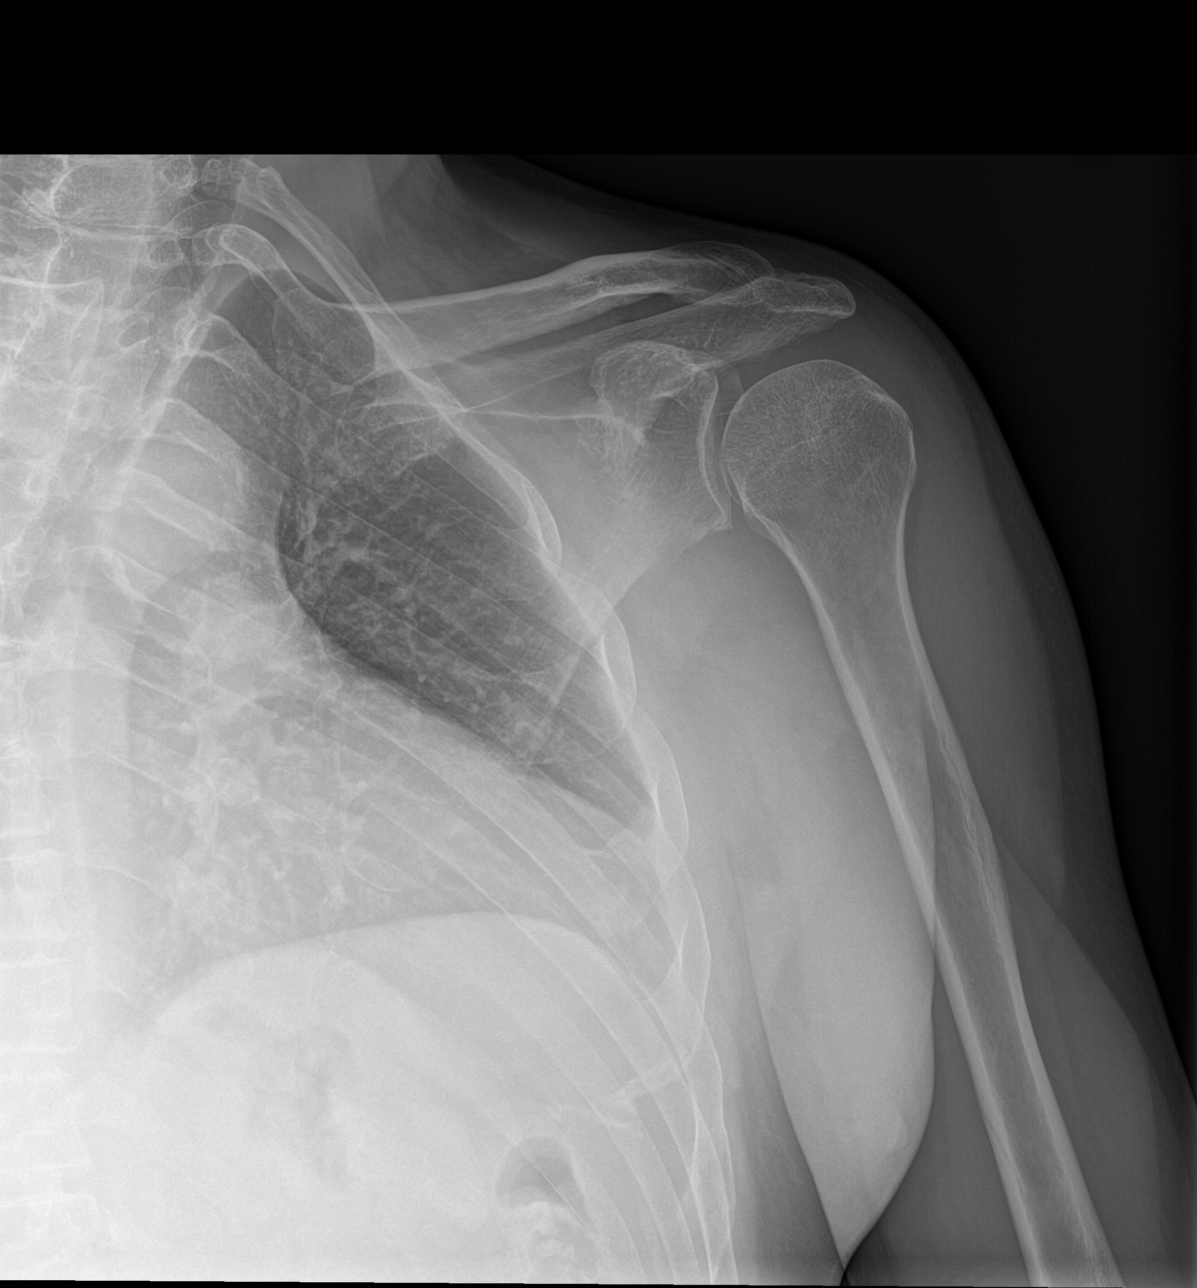

[shoulder ap (2 of 2)]
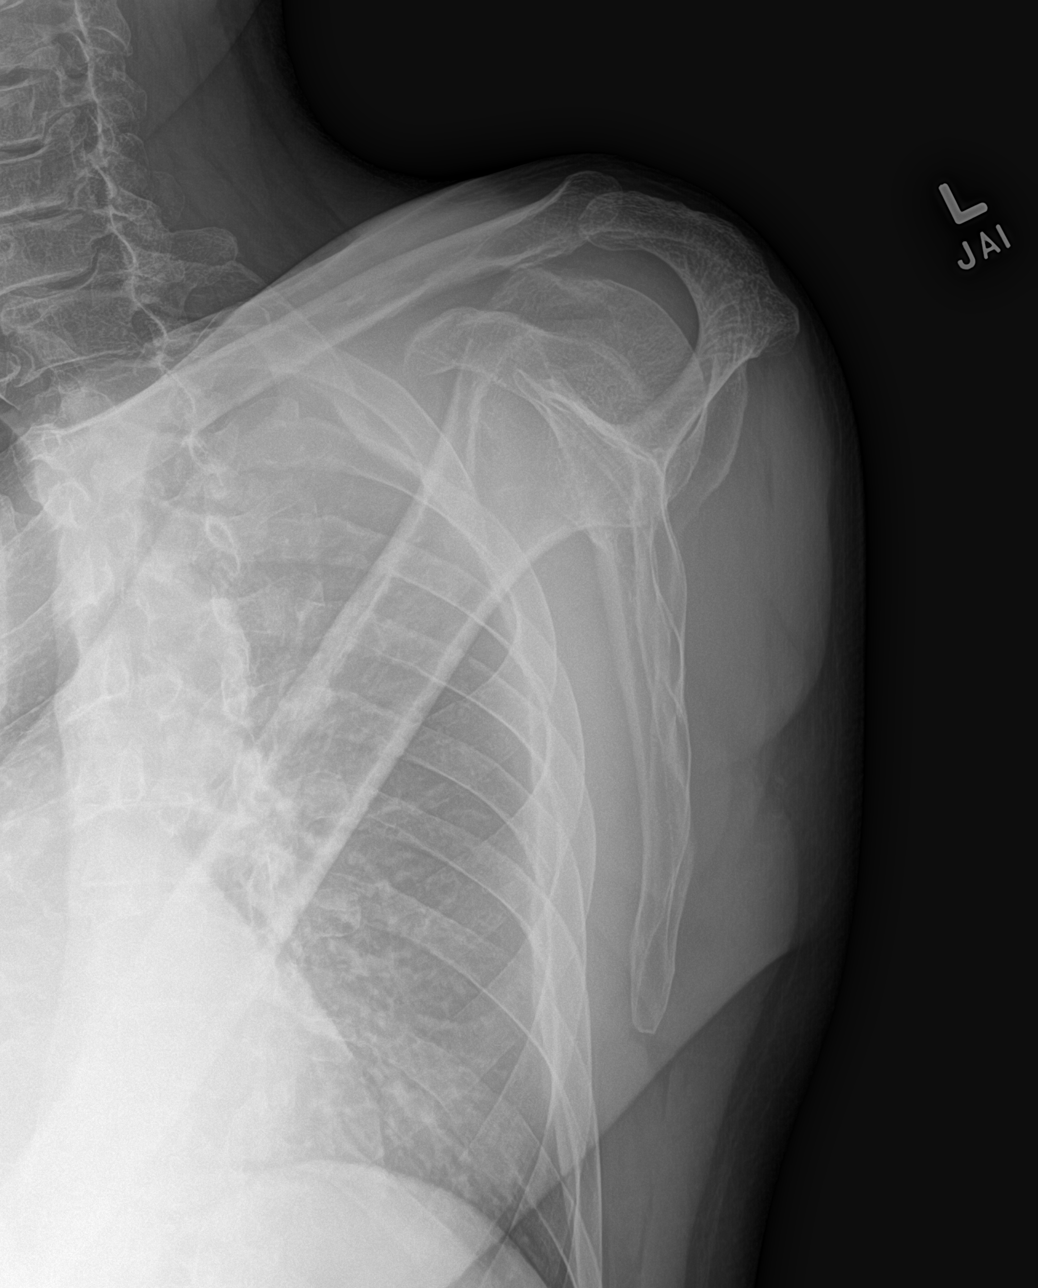

[shoulder axial]
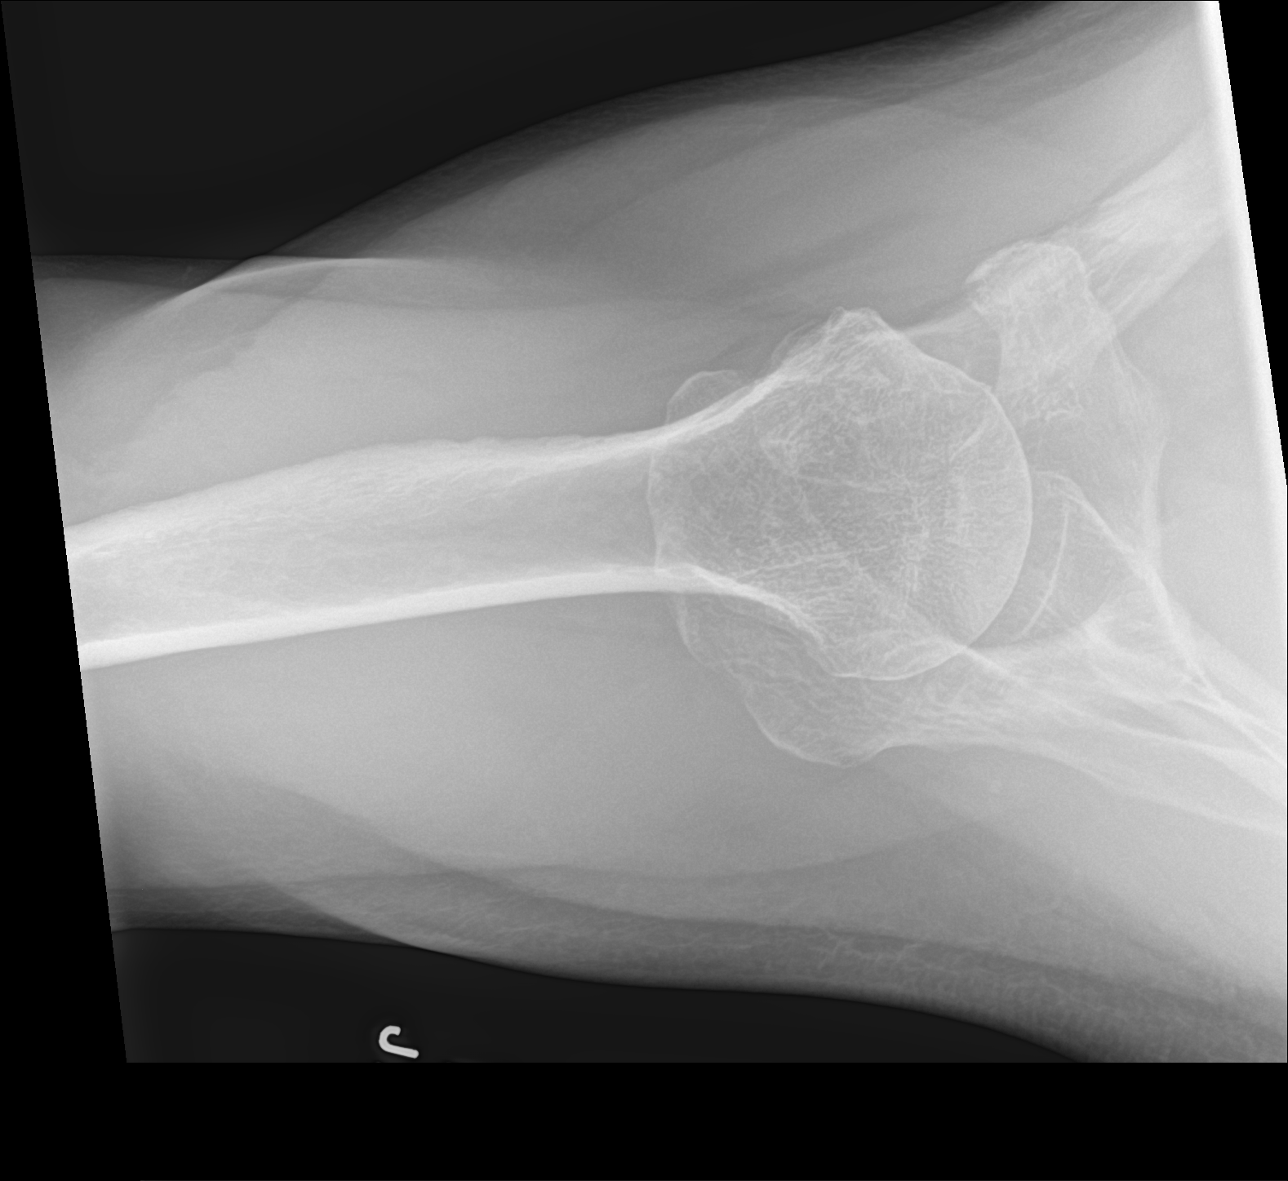

[3 of 3 positions shown; findings below may reference images not displayed]

FINDINGS: Oblique, Y scapular, and axillary images were obtained. There is no
fracture or dislocation. The joint spaces appear normal. No erosive
change or intra-articular calcification. Visualized left lung clear.
IMPRESSION: No fracture or dislocation.  No appreciable arthropathy.

## 2022-02-03 NOTE — Telephone Encounter (Addendum)
Order for ONO faxed over to [facility]. Requesting asap processing. Received a receipt of confirmation.

## 2022-02-08 ENCOUNTER — Encounter: Payer: Self-pay | Admitting: Internal Medicine

## 2022-02-22 ENCOUNTER — Other Ambulatory Visit: Payer: Self-pay

## 2022-02-22 DIAGNOSIS — Z122 Encounter for screening for malignant neoplasm of respiratory organs: Secondary | ICD-10-CM

## 2022-02-22 DIAGNOSIS — Z87891 Personal history of nicotine dependence: Secondary | ICD-10-CM

## 2022-02-25 ENCOUNTER — Encounter: Payer: Self-pay | Admitting: Adult Health

## 2022-03-02 DIAGNOSIS — G4733 Obstructive sleep apnea (adult) (pediatric): Secondary | ICD-10-CM | POA: Diagnosis not present

## 2022-03-03 NOTE — Telephone Encounter (Signed)
ONO results received and placed on Megan NP's desk for review.

## 2022-03-04 ENCOUNTER — Other Ambulatory Visit: Payer: Self-pay | Admitting: Internal Medicine

## 2022-03-04 DIAGNOSIS — Z23 Encounter for immunization: Secondary | ICD-10-CM | POA: Diagnosis not present

## 2022-03-04 DIAGNOSIS — I10 Essential (primary) hypertension: Secondary | ICD-10-CM

## 2022-03-04 DIAGNOSIS — Z713 Dietary counseling and surveillance: Secondary | ICD-10-CM | POA: Diagnosis not present

## 2022-03-10 DIAGNOSIS — G4733 Obstructive sleep apnea (adult) (pediatric): Secondary | ICD-10-CM | POA: Diagnosis not present

## 2022-03-15 ENCOUNTER — Telehealth: Payer: Self-pay | Admitting: Adult Health

## 2022-03-15 ENCOUNTER — Encounter: Payer: Self-pay | Admitting: Acute Care

## 2022-03-15 ENCOUNTER — Ambulatory Visit (INDEPENDENT_AMBULATORY_CARE_PROVIDER_SITE_OTHER): Payer: BC Managed Care – PPO | Admitting: Acute Care

## 2022-03-15 DIAGNOSIS — Z87891 Personal history of nicotine dependence: Secondary | ICD-10-CM

## 2022-03-15 NOTE — Patient Instructions (Signed)
Thank you for participating in the [facility]. It was our pleasure to meet you today. We will call you with the results of your scan within the next few days. Your scan will be assigned a Lung RADS category score by the physicians reading the scans.  This Lung RADS score determines follow up scanning.  See below for description of categories, and follow up screening recommendations. We will be in touch to schedule your follow up screening annually or based on recommendations of our providers. We will fax a copy of your scan results to your Primary Care Physician, or the physician who referred you to the program, to ensure they have the results. Please call the office if you have any questions or concerns regarding your scanning experience or results.  Our office number is [phone]. Please speak with Denise Phelps, RN. , or  Denise Buckner RN, They are  our Lung Cancer Screening RN.'s If They are unavailable when you call, Please leave a message on the voice mail. We will return your call at our earliest convenience.This voice mail is monitored several times a day.  Remember, if your scan is normal, we will scan you annually as long as you continue to meet the criteria for the program. (Age 55-77, Current smoker or smoker who has quit within the last 15 years). If you are a smoker, remember, quitting is the single most powerful action that you can take to decrease your risk of lung cancer and other pulmonary, breathing related problems. We know quitting is hard, and we are here to help.  Please let us know if there is anything we can do to help you meet your goal of quitting. If you are a former smoker, congratulations. We are proud of you! Remain smoke free! Remember you can refer friends or family members through the number above.  We will screen them to make sure they meet criteria for the program. Thank you for helping us take better care of you by  participating in Lung Screening.  You can receive free nicotine replacement therapy ( patches, gum or mints) by calling 1-800-QUIT NOW. Please call so we can get you on the path to becoming  a non-smoker. I know it is hard, but you can do this!  Lung RADS Categories:  Lung RADS 1: no nodules or definitely non-concerning nodules.  Recommendation is for a repeat annual scan in 12 months.  Lung RADS 2:  nodules that are non-concerning in appearance and behavior with a very low likelihood of becoming an active cancer. Recommendation is for a repeat annual scan in 12 months.  Lung RADS 3: nodules that are probably non-concerning , includes nodules with a low likelihood of becoming an active cancer.  Recommendation is for a 6-month repeat screening scan. Often noted after an upper respiratory illness. We will be in touch to make sure you have no questions, and to schedule your 6-month scan.  Lung RADS 4 A: nodules with concerning findings, recommendation is most often for a follow up scan in 3 months or additional testing based on our provider's assessment of the scan. We will be in touch to make sure you have no questions and to schedule the recommended 3 month follow up scan.  Lung RADS 4 B:  indicates findings that are concerning. We will be in touch with you to schedule additional diagnostic testing based on our provider's  assessment of the scan.  Other options for assistance in smoking cessation (   As covered by your insurance benefits)  Hypnosis for smoking cessation  [employer] [phone]  Acupuncture for smoking cessation  [facility] [phone]   

## 2022-03-15 NOTE — Telephone Encounter (Signed)
Pt was called to schedule his initial CPAP f/u.  Pt states he declined a new CPAP machine.  Pt informed no further action would be taken

## 2022-03-15 NOTE — Progress Notes (Signed)
Virtual Visit via Telephone Note  I connected with Caleb Obrien on 03/15/22 at  4:00 PM EDT by telephone and verified that I am speaking with the correct person using two identifiers.  Location: Patient:  At home Provider:  [place], [place], [place], [address]    I discussed the limitations, risks, security and privacy concerns of performing an evaluation and management service by telephone and the availability of in person appointments. I also discussed with the patient that there may be a patient responsible charge related to this service. The patient expressed understanding and agreed to proceed.   Shared Decision Making Visit Lung Cancer Screening Program [phone])   Eligibility: Age 56 y.o. Pack Years Smoking History Calculation 24 pack year smoking history (# packs/per year x # years smoked) Recent History of coughing up blood  no Unexplained weight loss? no ( >Than 15 pounds within the last 6 months ) Prior History Lung / other cancer no (Diagnosis within the last 5 years already requiring surveillance chest CT Scans). Smoking Status Former Smoker Former Smokers: Years since quit: < 1 year  Quit Date:  03/12/2022  Visit Components: Discussion included one or more decision making aids. yes Discussion included risk/benefits of screening. yes Discussion included potential follow up diagnostic testing for abnormal scans. yes Discussion included meaning and risk of over diagnosis. yes Discussion included meaning and risk of False Positives. yes Discussion included meaning of total radiation exposure. yes  Counseling Included: Importance of adherence to annual lung cancer LDCT screening. yes Impact of comorbidities on ability to participate in the program. yes Ability and willingness to under diagnostic treatment. yes  Smoking Cessation Counseling: Current Smokers:  Discussed importance of smoking cessation. yes Information about tobacco cessation classes  and interventions provided to patient. yes Patient provided with "ticket" for LDCT Scan. yes Symptomatic Patient. no  Counseling NA Diagnosis Code: Tobacco Use Z72.0 Asymptomatic Patient yes  Counseling (Intermediate counseling: > three minutes counseling) [id number] Former Smokers:  Discussed the importance of maintaining cigarette abstinence. yes Diagnosis Code: Personal History of Nicotine Dependence. X21.194 Information about tobacco cessation classes and interventions provided to patient. Yes Patient provided with "ticket" for LDCT Scan. yes Written Order for Lung Cancer Screening with LDCT placed in Epic. Yes (CT Chest Lung Cancer Screening Low Dose W/O CM) [id number] Z12.2-Screening of respiratory organs Z87.891-Personal history of nicotine dependence  I spent 25 minutes of face to face time/virtual visit time  with  Caleb Obrien discussing the risks and benefits of lung cancer screening. We took the time to pause the power point at intervals to allow for questions to be asked and answered to ensure understanding. We discussed that he had taken the single most powerful action possible to decrease his risk of developing lung cancer when he quit smoking. I counseled him to remain smoke free, and to contact me if he ever had the desire to smoke again so that I can provide resources and tools to help support the effort to remain smoke free. We discussed the time and location of the scan, and that either  Caleb Glassman RN, Caleb Prince, RN or I  or I will call / send a letter with the results within  24-72 hours of receiving them. He has the office contact information in the event he needs to speak with me,  he verbalized understanding of all of the above and had no further questions upon leaving the office.     I explained to the patient  that there has been a high incidence of coronary artery disease noted on these exams. I explained that this is a non-gated exam therefore degree or severity  cannot be determined. This patient is not on statin therapy. I have asked the patient to follow-up with their PCP regarding any incidental finding of coronary artery disease and management with diet or medication as they feel is clinically indicated. The patient verbalized understanding of the above and had no further questions.     Caleb Spatz, NP 03/15/2022

## 2022-03-16 ENCOUNTER — Ambulatory Visit
Admission: RE | Admit: 2022-03-16 | Discharge: 2022-03-16 | Disposition: A | Discharge status: Home / Self Care | Payer: BC Managed Care – PPO | Source: Ambulatory Visit | Attending: Acute Care | Admitting: Acute Care

## 2022-03-16 DIAGNOSIS — Z87891 Personal history of nicotine dependence: Secondary | ICD-10-CM

## 2022-03-16 DIAGNOSIS — Z122 Encounter for screening for malignant neoplasm of respiratory organs: Secondary | ICD-10-CM

## 2022-03-21 ENCOUNTER — Other Ambulatory Visit: Payer: Self-pay | Admitting: Acute Care

## 2022-03-21 DIAGNOSIS — Z87891 Personal history of nicotine dependence: Secondary | ICD-10-CM

## 2022-03-21 DIAGNOSIS — Z122 Encounter for screening for malignant neoplasm of respiratory organs: Secondary | ICD-10-CM

## 2022-03-23 ENCOUNTER — Other Ambulatory Visit: Payer: Self-pay | Admitting: Internal Medicine

## 2022-03-23 ENCOUNTER — Encounter: Payer: Self-pay | Admitting: Internal Medicine

## 2022-03-23 DIAGNOSIS — Z7252 High risk homosexual behavior: Secondary | ICD-10-CM

## 2022-03-23 MED ORDER — EMTRICITABINE-TENOFOVIR DF 200-300 MG PO TABS: 1.0000 | ORAL_TABLET | Freq: Every day | ORAL | 0 refills | Status: DC

## 2022-03-29 ENCOUNTER — Encounter: Payer: Self-pay | Admitting: Internal Medicine

## 2022-03-29 ENCOUNTER — Ambulatory Visit: Payer: BC Managed Care – PPO | Admitting: Internal Medicine

## 2022-03-29 VITALS — BP 136/84 | HR 84 | Temp 98.6°F | Ht 67.0 in | Wt 166.0 lb

## 2022-03-29 DIAGNOSIS — G63 Polyneuropathy in diseases classified elsewhere: Secondary | ICD-10-CM

## 2022-03-29 DIAGNOSIS — E785 Hyperlipidemia, unspecified: Secondary | ICD-10-CM | POA: Diagnosis not present

## 2022-03-29 DIAGNOSIS — F411 Generalized anxiety disorder: Secondary | ICD-10-CM

## 2022-03-29 DIAGNOSIS — Z23 Encounter for immunization: Secondary | ICD-10-CM

## 2022-03-29 DIAGNOSIS — I700 Atherosclerosis of aorta: Secondary | ICD-10-CM | POA: Diagnosis not present

## 2022-03-29 DIAGNOSIS — I10 Essential (primary) hypertension: Secondary | ICD-10-CM

## 2022-03-29 DIAGNOSIS — Z7252 High risk homosexual behavior: Secondary | ICD-10-CM | POA: Diagnosis not present

## 2022-03-29 DIAGNOSIS — J431 Panlobular emphysema: Secondary | ICD-10-CM

## 2022-03-29 DIAGNOSIS — E538 Deficiency of other specified B group vitamins: Secondary | ICD-10-CM

## 2022-03-29 LAB — CBC WITH DIFFERENTIAL/PLATELET
Basophils Absolute: 0 10*3/uL (ref 0.0–0.1)
Basophils Relative: 0.5 % (ref 0.0–3.0)
Eosinophils Absolute: 0.1 10*3/uL (ref 0.0–0.7)
Eosinophils Relative: 1 % (ref 0.0–5.0)
HCT: 44.4 % (ref 39.0–52.0)
Hemoglobin: 15 g/dL (ref 13.0–17.0)
Lymphocytes Relative: 26.2 % (ref 12.0–46.0)
Lymphs Abs: 1.7 10*3/uL (ref 0.7–4.0)
MCHC: 33.9 g/dL (ref 30.0–36.0)
MCV: 106 fl — ABNORMAL HIGH (ref 78.0–100.0)
Monocytes Absolute: 1.1 10*3/uL — ABNORMAL HIGH (ref 0.1–1.0)
Monocytes Relative: 17.3 % — ABNORMAL HIGH (ref 3.0–12.0)
Neutro Abs: 3.5 10*3/uL (ref 1.4–7.7)
Neutrophils Relative %: 55 % (ref 43.0–77.0)
Platelets: 277 10*3/uL (ref 150.0–400.0)
RBC: 4.19 Mil/uL — ABNORMAL LOW (ref 4.22–5.81)
RDW: 12.7 % (ref 11.5–15.5)
WBC: 6.3 10*3/uL (ref 4.0–10.5)

## 2022-03-29 LAB — BASIC METABOLIC PANEL
BUN: 14 mg/dL (ref 6–23)
CO2: 31 mEq/L (ref 19–32)
Calcium: 9.6 mg/dL (ref 8.4–10.5)
Chloride: 98 mEq/L (ref 96–112)
Creatinine, Ser: 0.9 mg/dL (ref 0.40–1.50)
GFR: 96.79 mL/min (ref >60.00)
Glucose, Bld: 90 mg/dL (ref 70–99)
Potassium: 3.6 mEq/L (ref 3.5–5.1)
Sodium: 138 mEq/L (ref 135–145)

## 2022-03-29 MED ORDER — BUPROPION HCL ER (SR) 100 MG PO TB12: 100.0000 mg | ORAL_TABLET | Freq: Two times a day (BID) | ORAL | 0 refills | Status: DC

## 2022-03-29 MED ORDER — ASPIRIN 81 MG PO TBEC: 81.0000 mg | DELAYED_RELEASE_TABLET | Freq: Every day | ORAL | 1 refills | Status: DC

## 2022-03-29 MED ORDER — IRBESARTAN-HYDROCHLOROTHIAZIDE 300-12.5 MG PO TABS: 1.0000 | ORAL_TABLET | Freq: Every day | ORAL | 0 refills | Status: DC

## 2022-03-29 MED ORDER — ATORVASTATIN CALCIUM 40 MG PO TABS: 40.0000 mg | ORAL_TABLET | Freq: Every day | ORAL | 1 refills | Status: DC

## 2022-03-29 NOTE — Patient Instructions (Signed)
Hypertension, Adult High blood pressure (hypertension) is when the force of blood pumping through the arteries is too strong. The arteries are the blood vessels that carry blood from the heart throughout the body. Hypertension forces the heart to work harder to pump blood and may cause arteries to become narrow or stiff. Untreated or uncontrolled hypertension can lead to a heart attack, heart failure, a stroke, kidney disease, and other problems. A blood pressure reading consists of a higher number over a lower number. Ideally, your blood pressure should be below 120/80. The first ("top") number is called the systolic pressure. It is a measure of the pressure in your arteries as your heart beats. The second ("bottom") number is called the diastolic pressure. It is a measure of the pressure in your arteries as the heart relaxes. What are the causes? The exact cause of this condition is not known. There are some conditions that result in high blood pressure. What increases the risk? Certain factors may make you more likely to develop high blood pressure. Some of these risk factors are under your control, including: Smoking. Not getting enough exercise or physical activity. Being overweight. Having too much fat, sugar, calories, or salt (sodium) in your diet. Drinking too much alcohol. Other risk factors include: Having a personal history of heart disease, diabetes, high cholesterol, or kidney disease. Stress. Having a family history of high blood pressure and high cholesterol. Having obstructive sleep apnea. Age. The risk increases with age. What are the signs or symptoms? High blood pressure may not cause symptoms. Very high blood pressure (hypertensive crisis) may cause: Headache. Fast or irregular heartbeats (palpitations). Shortness of breath. Nosebleed. Nausea and vomiting. Vision changes. Severe chest pain, dizziness, and seizures. How is this diagnosed? This condition is diagnosed by  measuring your blood pressure while you are seated, with your arm resting on a flat surface, your legs uncrossed, and your feet flat on the floor. The cuff of the blood pressure monitor will be placed directly against the skin of your upper arm at the level of your heart. Blood pressure should be measured at least twice using the same arm. Certain conditions can cause a difference in blood pressure between your right and left arms. If you have a high blood pressure reading during one visit or you have normal blood pressure with other risk factors, you may be asked to: Return on a different day to have your blood pressure checked again. Monitor your blood pressure at home for 1 week or longer. If you are diagnosed with hypertension, you may have other blood or imaging tests to help your health care provider understand your overall risk for other conditions. How is this treated? This condition is treated by making healthy lifestyle changes, such as eating healthy foods, exercising more, and reducing your alcohol intake. You may be referred for counseling on a healthy diet and physical activity. Your health care provider may prescribe medicine if lifestyle changes are not enough to get your blood pressure under control and if: Your systolic blood pressure is above 130. Your diastolic blood pressure is above 80. Your personal target blood pressure may vary depending on your medical conditions, your age, and other factors. Follow these instructions at home: Eating and drinking  Eat a diet that is high in fiber and potassium, and low in sodium, added sugar, and fat. An example of this eating plan is called the DASH diet. DASH stands for Dietary Approaches to Stop Hypertension. To eat this way: Eat   plenty of fresh fruits and vegetables. Try to fill one half of your plate at each meal with fruits and vegetables. Eat whole grains, such as whole-wheat pasta, brown rice, or whole-grain bread. Fill about one  fourth of your plate with whole grains. Eat or drink low-fat dairy products, such as skim milk or low-fat yogurt. Avoid fatty cuts of meat, processed or cured meats, and poultry with skin. Fill about one fourth of your plate with lean proteins, such as fish, chicken without skin, beans, eggs, or tofu. Avoid pre-made and processed foods. These tend to be higher in sodium, added sugar, and fat. Reduce your daily sodium intake. Many people with hypertension should eat less than 1,500 mg of sodium a day. Do not drink alcohol if: Your health care provider tells you not to drink. You are pregnant, may be pregnant, or are planning to become pregnant. If you drink alcohol: Limit how much you have to: 0-1 drink a day for women. 0-2 drinks a day for men. Know how much alcohol is in your drink. In the [place] one drink equals one 12 oz bottle of beer (355 mL), one 5 oz glass of wine (148 mL), or one 1 oz glass of hard liquor (44 mL). Lifestyle  Work with your health care provider to maintain a healthy body weight or to lose weight. Ask what an ideal weight is for you. Get at least 30 minutes of exercise that causes your heart to beat faster (aerobic exercise) most days of the week. Activities may include walking, swimming, or biking. Include exercise to strengthen your muscles (resistance exercise), such as Pilates or lifting weights, as part of your weekly exercise routine. Try to do these types of exercises for 30 minutes at least 3 days a week. Do not use any products that contain nicotine or tobacco. These products include cigarettes, chewing tobacco, and vaping devices, such as e-cigarettes. If you need help quitting, ask your health care provider. Monitor your blood pressure at home as told by your health care provider. Keep all follow-up visits. This is important. Medicines Take over-the-counter and prescription medicines only as told by your health care provider. Follow directions carefully. Blood  pressure medicines must be taken as prescribed. Do not skip doses of blood pressure medicine. Doing this puts you at risk for problems and can make the medicine less effective. Ask your health care provider about side effects or reactions to medicines that you should watch for. Contact a health care provider if you: Think you are having a reaction to a medicine you are taking. Have headaches that keep coming back (recurring). Feel dizzy. Have swelling in your ankles. Have trouble with your vision. Get help right away if you: Develop a severe headache or confusion. Have unusual weakness or numbness. Feel faint. Have severe pain in your chest or abdomen. Vomit repeatedly. Have trouble breathing. These symptoms may be an emergency. Get help right away. Call 911. Do not wait to see if the symptoms will go away. Do not drive yourself to the hospital. Summary Hypertension is when the force of blood pumping through your arteries is too strong. If this condition is not controlled, it may put you at risk for serious complications. Your personal target blood pressure may vary depending on your medical conditions, your age, and other factors. For most people, a normal blood pressure is less than 120/80. Hypertension is treated with lifestyle changes, medicines, or a combination of both. Lifestyle changes include losing weight, eating a healthy,   low-sodium diet, exercising more, and limiting alcohol. This information is not intended to replace advice given to you by your health care provider. Make sure you discuss any questions you have with your health care provider. Document Revised: 04/06/2021 Document Reviewed: 04/06/2021 Elsevier Patient Education  2023 [employer]  

## 2022-03-29 NOTE — Progress Notes (Signed)
Subjective:  Patient ID: Caleb Obrien, male    DOB: 08/30/1965  Age: 56 y.o. MRN: [id number]  CC: Hypertension   HPI Caleb Obrien presents for f/up -  He is concerned that a recent chest CT that was positive for emphysema. He has rare episodes of SOB but denies CP, diaphoresis, cough, wheezing. He wants to see a pulmonologist.  Outpatient Medications Prior to Visit  Medication Sig Dispense Refill   albuterol (VENTOLIN HFA) 108 (90 Base) MCG/ACT inhaler Inhale 1-2 puffs into the lungs every 6 (six) hours as needed for wheezing or shortness of breath. 18 g 5   ALPRAZolam (XANAX) 0.25 MG tablet TAKE ONE HALF TABLET BY MOUTH AS NEEDED 30 tablet 2   carvedilol (COREG) 3.125 MG tablet Take 1 tablet (3.125 mg total) by mouth 2 (two) times daily with a meal. 180 tablet 0   cetirizine (ZYRTEC) 10 MG tablet TAKE 1 TABLET(10 MG) BY MOUTH DAILY 30 tablet 11   Cholecalciferol 2000 units TABS Take 1 tablet (2,000 Units total) by mouth daily. 90 tablet 1   dexlansoprazole (DEXILANT) 60 MG capsule Take 1 capsule (60 mg total) by mouth daily. 90 capsule 1   emtricitabine-tenofovir (TRUVADA) 200-300 MG tablet Take 1 tablet by mouth daily. 90 tablet 0   tadalafil (CIALIS) 20 MG tablet TAKE ONE TABLET BY MOUTH DAILY AS NEEDED FOR ERECTILE DYSFUNCTION 30 tablet 1   Testosterone (ANDROGEL PUMP) 20.25 MG/ACT (1.62%) GEL Place 1 Act onto the skin daily. 225 g 0   atorvastatin (LIPITOR) 40 MG tablet TAKE ONE TABLET BY MOUTH DAILY 90 tablet 1   buPROPion ER (WELLBUTRIN SR) 100 MG 12 hr tablet Take 1 tablet (100 mg total) by mouth 2 (two) times daily. 180 tablet 0   irbesartan-hydrochlorothiazide (AVALIDE) 300-12.5 MG tablet TAKE 1 TABLET BY MOUTH DAILY 90 tablet 0   No facility-administered medications prior to visit.    ROS Review of Systems  Constitutional:  Negative for chills, diaphoresis, fatigue, fever and unexpected weight change.  HENT: Negative.  Negative for sore throat and trouble  swallowing.   Eyes: Negative.   Respiratory:  Positive for shortness of breath. Negative for cough, chest tightness, wheezing and stridor.   Cardiovascular:  Negative for chest pain, palpitations and leg swelling.  Gastrointestinal: Negative.  Negative for abdominal pain, constipation, diarrhea, nausea and vomiting.  Endocrine: Negative.   Genitourinary: Negative.  Negative for difficulty urinating, dysuria, hematuria, penile discharge, penile swelling, scrotal swelling and testicular pain.  Musculoskeletal: Negative.   Skin: Negative.  Negative for color change.  Neurological:  Negative for dizziness, weakness and light-headedness.  Hematological:  Negative for adenopathy. Does not bruise/bleed easily.  Psychiatric/Behavioral: Negative.      Objective:  BP 136/84 (BP Location: Left Arm, Patient Position: Sitting, Cuff Size: Large)   Pulse 84   Temp 98.6 F (37 C) (Oral)   Ht '5\' 7"'$  (1.702 m)   Wt 166 lb (75.3 kg)   SpO2 96%   BMI 26.00 kg/m   BP Readings from Last 3 Encounters:  03/29/22 136/84  01/11/22 132/86  10/19/21 128/82    Wt Readings from Last 3 Encounters:  03/29/22 166 lb (75.3 kg)  01/11/22 165 lb (74.8 kg)  10/19/21 162 lb (73.5 kg)    Physical Exam Vitals reviewed.  HENT:     Nose: Nose normal.     Mouth/Throat:     Mouth: Mucous membranes are moist.  Eyes:     General: No scleral icterus.  Conjunctiva/sclera: Conjunctivae normal.  Cardiovascular:     Rate and Rhythm: Normal rate and regular rhythm.     Heart sounds: No murmur heard. Pulmonary:     Effort: Pulmonary effort is normal.     Breath sounds: No stridor. No wheezing, rhonchi or rales.  Abdominal:     General: Abdomen is flat.     Palpations: There is no mass.     Tenderness: There is no abdominal tenderness. There is no guarding.     Hernia: No hernia is present.  Musculoskeletal:        General: Normal range of motion.     Cervical back: Neck supple.     Right lower leg: No edema.      Left lower leg: No edema.  Lymphadenopathy:     Cervical: No cervical adenopathy.  Skin:    General: Skin is warm and dry.     Findings: No rash.  Neurological:     General: No focal deficit present.     Mental Status: He is alert. Mental status is at baseline.  Psychiatric:        Mood and Affect: Mood normal.        Behavior: Behavior normal.     Lab Results  Component Value Date   WBC 6.3 03/29/2022   HGB 15.0 03/29/2022   HCT 44.4 03/29/2022   PLT 277.0 03/29/2022   GLUCOSE 90 03/29/2022   CHOL 153 06/08/2021   TRIG 95.0 06/08/2021   HDL 50.40 06/08/2021   LDLDIRECT 144.4 01/27/2012   LDLCALC 83 06/08/2021   ALT 31 06/08/2021   AST 21 06/08/2021   NA 138 03/29/2022   K 3.6 03/29/2022   CL 98 03/29/2022   CREATININE 0.90 03/29/2022   BUN 14 03/29/2022   CO2 31 03/29/2022   TSH 3.44 06/08/2021   PSA 1.14 06/08/2021    CT CHEST LUNG CA SCREEN LOW DOSE W/O CM  Result Date: 03/18/2022 CLINICAL DATA:  Former smoker, quit August 2023, 24 pack-year history. EXAM: CT CHEST WITHOUT CONTRAST LOW-DOSE FOR LUNG CANCER SCREENING TECHNIQUE: Multidetector CT imaging of the chest was performed following the standard protocol without IV contrast. RADIATION DOSE REDUCTION: This exam was performed according to the departmental dose-optimization program which includes automated exposure control, adjustment of the mA and/or kV according to patient size and/or use of iterative reconstruction technique. COMPARISON:  None Available. FINDINGS: Cardiovascular: Atherosclerotic calcification of the aorta. Heart size normal. No pericardial effusion. Mediastinum/Nodes: No pathologically enlarged mediastinal or axillary lymph nodes. Hilar regions are difficult to definitively evaluate without IV contrast. Esophagus is grossly unremarkable. Lungs/Pleura: Centrilobular emphysema. No suspicious pulmonary nodules. No pleural fluid. Airway is unremarkable. Upper Abdomen: Visualized portions of the  liver, gallbladder, adrenal glands, kidneys, spleen, pancreas, stomach and bowel are grossly unremarkable. No upper abdominal adenopathy. Musculoskeletal: Degenerative changes in the spine. No worrisome lytic or sclerotic lesions. IMPRESSION: 1. Lung-RADS 1, negative. Continue annual screening with low-dose chest CT without contrast in 12 months. 2.  Aortic atherosclerosis (ICD10-I70.0). 3.  Emphysema (ICD10-J43.9). Electronically Signed   By: Lorin Picket M.D.   On: 03/18/2022 11:10   Assessment & Plan:   Caleb Obrien was seen today for hypertension.  Diagnoses and all orders for this visit:  Atherosclerosis of aorta (HCC) -     aspirin EC 81 MG tablet; Take 1 tablet (81 mg total) by mouth daily. Swallow whole.  GAD (generalized anxiety disorder) -     buPROPion ER (WELLBUTRIN SR) 100 MG 12 hr  tablet; Take 1 tablet (100 mg total) by mouth 2 (two) times daily.  Hyperlipidemia with target LDL less than 130- LDL goal achieved. Doing well on the statin  -     atorvastatin (LIPITOR) 40 MG tablet; Take 1 tablet (40 mg total) by mouth daily.  Essential hypertension, benign- His BP is well controlled. -     irbesartan-hydrochlorothiazide (AVALIDE) 300-12.5 MG tablet; Take 1 tablet by mouth daily. -     Basic metabolic panel; Future -     CBC with Differential/Platelet; Future -     CBC with Differential/Platelet -     Basic metabolic panel  Panlobular emphysema ([place]) -     Ambulatory referral to Pulmonology  Vitamin B12 deficiency neuropathy ([place]) -     CBC with Differential/Platelet; Future -     CBC with Differential/Platelet  High risk homosexual behavior- Labs are normal. -     HIV Antibody (routine testing w rflx); Future -     Hepatitis B surface antigen; Future -     RPR; Future -     RPR -     Hepatitis B surface antigen -     HIV Antibody (routine testing w rflx)  Need for vaccination -     Pneumococcal conjugate vaccine 20-valent   I have changed Caleb Obrien  "Chris"'s atorvastatin. I am also having him start on aspirin EC. Additionally, I am having him maintain his cetirizine, Cholecalciferol, albuterol, dexlansoprazole, ALPRAZolam, Testosterone, tadalafil, carvedilol, emtricitabine-tenofovir, buPROPion ER, and irbesartan-hydrochlorothiazide.  Meds ordered this encounter  Medications   buPROPion ER (WELLBUTRIN SR) 100 MG 12 hr tablet    Sig: Take 1 tablet (100 mg total) by mouth 2 (two) times daily.    Dispense:  180 tablet    Refill:  0   atorvastatin (LIPITOR) 40 MG tablet    Sig: Take 1 tablet (40 mg total) by mouth daily.    Dispense:  90 tablet    Refill:  1   irbesartan-hydrochlorothiazide (AVALIDE) 300-12.5 MG tablet    Sig: Take 1 tablet by mouth daily.    Dispense:  90 tablet    Refill:  0   aspirin EC 81 MG tablet    Sig: Take 1 tablet (81 mg total) by mouth daily. Swallow whole.    Dispense:  90 tablet    Refill:  1     Follow-up: Return in about 3 months (around 06/29/2022).  Scarlette Calico, MD

## 2022-03-30 LAB — HEPATITIS B SURFACE ANTIGEN: Hepatitis B Surface Ag: NONREACTIVE

## 2022-03-30 LAB — HIV ANTIBODY (ROUTINE TESTING W REFLEX): HIV 1&2 Ab, 4th Generation: NONREACTIVE

## 2022-03-30 LAB — RPR: RPR Ser Ql: NONREACTIVE

## 2022-04-21 ENCOUNTER — Institutional Professional Consult (permissible substitution): Payer: BC Managed Care – PPO | Admitting: Pulmonary Disease

## 2022-05-09 ENCOUNTER — Ambulatory Visit (INDEPENDENT_AMBULATORY_CARE_PROVIDER_SITE_OTHER): Payer: BC Managed Care – PPO | Admitting: Pulmonary Disease

## 2022-05-09 ENCOUNTER — Encounter: Payer: Self-pay | Admitting: Pulmonary Disease

## 2022-05-09 VITALS — BP 138/80 | HR 96 | Ht 67.0 in | Wt 175.4 lb

## 2022-05-09 DIAGNOSIS — J432 Centrilobular emphysema: Secondary | ICD-10-CM | POA: Diagnosis not present

## 2022-05-09 DIAGNOSIS — Z716 Tobacco abuse counseling: Secondary | ICD-10-CM

## 2022-05-09 DIAGNOSIS — F17210 Nicotine dependence, cigarettes, uncomplicated: Secondary | ICD-10-CM | POA: Diagnosis not present

## 2022-05-09 NOTE — Patient Instructions (Addendum)
Nice to meet you  The findings of your CT scan are very mild  The emphysema would not progress if you stop smoking  Given your lack of symptoms overall, no need for new medications  Please let me know if I can help any way in the future if symptoms develop, primarily shortness of breath with exertion is out of proportion to expected or cough.  Return to clinic as needed

## 2022-05-09 NOTE — Progress Notes (Signed)
$'@Patient'Caleb Obrien$  ID: Caleb Obrien, male    DOB: Jan 24, 1966, 56 y.o.   MRN: [id number]  Chief Complaint  Patient presents with   Consult    Consult for emphysema. Pt states he had a CT lung cancer screening and showed emphysema and wanted to get checked for it. Pt states no issues noted with breathing. CT scan was 03/16/22    Referring provider: Janith Lima, MD  HPI:   56 y.o. man whom are seen in consultation for evaluation of emphysema.  Recent pulmonary note Caleb Form NP for lung cancer screening reviewed.  Most recent PCP note reviewed.  Patient underwent low-dose CT scan for screening lung cancer 03/2022.  On my review interpretation reveals very mild emphysema at the apices, very hard to detect, otherwise clear lungs bilaterally.  He denies any shortness of breath.  No significant dyspnea on exertion.  He is relatively sedentary and notes that he had to go exercise would be short of breath but nothing unexpected.  He denies any cough.  He does have a history of childhood asthma.  Last uses albuterol many months to years ago.  Has a an inhaler at home but he thinks it is likely expired.  Rare use.  We discussed at length the natural progression of emphysema.  Discussed a resting further development of emphysema with abstinence from smoking.  Last smoked 03/2022, quit after findings on CT scan.  Discussed natural decline in lung function over time and that it is possible that when he is older he may develop symptoms as decline in lung function meets damage from smoking, emphysema and because of worsening symptoms.  Discussed unlikely to need oxygen therapy in the future assuming he quit smoking.  PMH: Hypertension, childhood asthma, hyperlipidemia, OSA Surgical history: Reviewed, denies any Family history: Denies significant respiratory illness in first-degree relative Social history: off and on smoker 1991 to 03/2022, average half a pack a day although has gone months to years abstinent  at times, lives in [place] / Pulmonary Flowsheets:   ACT:      No data to display          MMRC:     No data to display          Epworth:      No data to display          Tests:   FENO:  No results found for: "NITRICOXIDE"  PFT:     No data to display          WALK:      No data to display          Imaging: Personally reviewed and as per EMR and discussion in this note No results found.  Lab Results: Personally reviewed CBC    Component Value Date/Time   WBC 6.3 03/29/2022 1526   RBC 4.19 (L) 03/29/2022 1526   HGB 15.0 03/29/2022 1526   HCT 44.4 03/29/2022 1526   PLT 277.0 03/29/2022 1526   MCV 106.0 (H) 03/29/2022 1526   MCHC 33.9 03/29/2022 1526   RDW 12.7 03/29/2022 1526   LYMPHSABS 1.7 03/29/2022 1526   MONOABS 1.1 (H) 03/29/2022 1526   EOSABS 0.1 03/29/2022 1526   BASOSABS 0.0 03/29/2022 1526    BMET    Component Value Date/Time   NA 138 03/29/2022 1526   K 3.6 03/29/2022 1526   CL 98 03/29/2022 1526   CO2 31 03/29/2022 1526   GLUCOSE 90 03/29/2022 1526  BUN 14 03/29/2022 1526   CREATININE 0.90 03/29/2022 1526   CALCIUM 9.6 03/29/2022 1526   GFRNONAA 77.08 01/18/2010 0755   GFRAA 95 01/10/2008 0931    BNP No results found for: "BNP"  ProBNP No results found for: "PROBNP"  Specialty Problems       Pulmonary Problems   OSA (obstructive sleep apnea)    NPSG 05/2013:  AHI 21/hr, cpap 6cm??      Mild persistent asthma   GERD with apnea   Panlobular emphysema (HCC)    Allergies  Allergen Reactions   Amlodipine     cough   Lisinopril     cough   Sudafed [Pseudoephedrine Hcl] Other (See Comments)    prostatism    Immunization History  Administered Date(s) Administered   HPV 9-valent 11/08/2018, 12/06/2018, 06/03/2019   Hepatitis B, adult 05/24/2016, 06/24/2016, 10/06/2016   Influenza Split 05/24/2011, 03/20/2012   Influenza Whole 03/29/2007, 03/27/2008, 03/25/2009    Influenza,inj,Quad PF,6+ Mos 05/22/2013, 02/07/2014, 04/27/2015, 04/12/2017   Influenza-Unspecified 03/11/2019, 03/13/2020, 04/06/2021   PFIZER(Purple Top)SARS-COV-2 Vaccination 08/17/2019, 09/07/2019, 03/29/2020   PNEUMOCOCCAL CONJUGATE-20 03/29/2022   Td 11/17/2006   Tdap 01/05/2017   Zoster Recombinat (Shingrix) 08/05/2020, 12/30/2020    Past Medical History:  Diagnosis Date   ALLERGIC RHINITIS CAUSE UNSPECIFIED 02/24/2009   Allergy    ASTHMA 02/05/2007   BENIGN PROSTATIC HYPERTROPHY, WITH URINARY OBSTRUCTION 05/22/2007   Dermatophytosis of foot 11/02/2009   GANGLION CYST, WRIST, LEFT 03/25/2009   Hemangioma of skin and subcutaneous tissue 06/30/2009   HEPATITIS B, HX OF 02/05/2007   per pt no history   HYPERLIPIDEMIA, BORDERLINE 04/21/2008   PLANTAR WART, RIGHT 01/23/2009   Sleep apnea    uses c-pap    Tobacco History: Social History   Tobacco Use  Smoking Status Former   Packs/day: 0.30   Types: Cigarettes   Quit date: 10/2017   Years since quitting: 4.5  Smokeless Tobacco Never   Counseling given: Not Answered   Continue to not smoke  Outpatient Encounter Medications as of 05/09/2022  Medication Sig   albuterol (VENTOLIN HFA) 108 (90 Base) MCG/ACT inhaler Inhale 1-2 puffs into the lungs every 6 (six) hours as needed for wheezing or shortness of breath.   ALPRAZolam (XANAX) 0.25 MG tablet TAKE ONE HALF TABLET BY MOUTH AS NEEDED   aspirin EC 81 MG tablet Take 1 tablet (81 mg total) by mouth daily. Swallow whole.   atorvastatin (LIPITOR) 40 MG tablet Take 1 tablet (40 mg total) by mouth daily.   buPROPion ER (WELLBUTRIN SR) 100 MG 12 hr tablet Take 1 tablet (100 mg total) by mouth 2 (two) times daily.   carvedilol (COREG) 3.125 MG tablet Take 1 tablet (3.125 mg total) by mouth 2 (two) times daily with a meal.   cetirizine (ZYRTEC) 10 MG tablet TAKE 1 TABLET(10 MG) BY MOUTH DAILY   Cholecalciferol 2000 units TABS Take 1 tablet (2,000 Units total) by mouth daily.    dexlansoprazole (DEXILANT) 60 MG capsule Take 1 capsule (60 mg total) by mouth daily.   emtricitabine-tenofovir (TRUVADA) 200-300 MG tablet Take 1 tablet by mouth daily.   irbesartan-hydrochlorothiazide (AVALIDE) 300-12.5 MG tablet Take 1 tablet by mouth daily.   tadalafil (CIALIS) 20 MG tablet TAKE ONE TABLET BY MOUTH DAILY AS NEEDED FOR ERECTILE DYSFUNCTION   Testosterone (ANDROGEL PUMP) 20.25 MG/ACT (1.62%) GEL Place 1 Act onto the skin daily.   No facility-administered encounter medications on file as of 05/09/2022.     Review of Systems  Review of Systems  No chest pain with exertion.  No orthopnea or PND.  Comprehensive review of systems otherwise negative. Physical Exam  BP 138/80 (BP Location: Right Arm, Patient Position: Sitting, Cuff Size: Normal)   Pulse 96   Ht '5\' 7"'$  (1.702 m)   Wt 175 lb 6.4 oz (79.6 kg)   SpO2 98%   BMI 27.47 kg/m   Wt Readings from Last 5 Encounters:  05/09/22 175 lb 6.4 oz (79.6 kg)  03/29/22 166 lb (75.3 kg)  01/11/22 165 lb (74.8 kg)  10/19/21 162 lb (73.5 kg)  09/07/21 169 lb (76.7 kg)    BMI Readings from Last 5 Encounters:  05/09/22 27.47 kg/m  03/29/22 26.00 kg/m  01/11/22 25.84 kg/m  10/19/21 25.37 kg/m  09/07/21 26.47 kg/m     Physical Exam  General: Sitting in chair, in no acute distress Eyes: EOMI, no icterus Neck: Supple, no JVP Pulmonary: Clear, good air movement, normal work of breathing Cardiovascular: Warm, no edema Abdomen: Nondistended, bowel sounds present MSK: No synovitis, no joint effusion Neuro: Normal gait, no weakness Psych: Normal mood, full affect   Assessment & Plan:   Emphysema: Very mild as demonstrated on low-dose CT scan for lung cancer screening.  Asymptomatic.  Encouraged to stop smoking as below to arrest development of worsening emphysema.  Given lack of symptoms, no need for additional medication.  Childhood asthma: Rare albuterol use.  Smoking assessment and cessation counseling I  have advised the patient to quit/stop smoking as soon as possible due to high risk for multiple medical problems.  It will also be very difficult for [place] to manage patient's  respiratory symptoms and status if we continue to expose her lungs to a known irritant.  We do not advise e-cigarettes as a Obrien of stopping smoking. Patient is willing to quit smoking. I have advised the patient that we can assist and have options of nicotine replacement therapy, provided smoking cessation education today, provided smoking cessation counseling, and provided cessation resources. Follow-up next office visit office visit for assessment of smoking cessation. 4 minutes spent in smoking cessation counseling.    Return if symptoms worsen or fail to improve.   Lanier Clam, MD 05/09/2022

## 2022-05-30 ENCOUNTER — Other Ambulatory Visit: Payer: Self-pay | Admitting: Internal Medicine

## 2022-05-30 DIAGNOSIS — I10 Essential (primary) hypertension: Secondary | ICD-10-CM

## 2022-06-09 ENCOUNTER — Ambulatory Visit (INDEPENDENT_AMBULATORY_CARE_PROVIDER_SITE_OTHER): Payer: BC Managed Care – PPO | Admitting: Internal Medicine

## 2022-06-09 ENCOUNTER — Encounter: Payer: Self-pay | Admitting: Internal Medicine

## 2022-06-09 VITALS — BP 142/90 | HR 90 | Temp 98.9°F | Ht 67.0 in | Wt 173.0 lb

## 2022-06-09 DIAGNOSIS — Z0001 Encounter for general adult medical examination with abnormal findings: Secondary | ICD-10-CM

## 2022-06-09 DIAGNOSIS — I10 Essential (primary) hypertension: Secondary | ICD-10-CM | POA: Diagnosis not present

## 2022-06-09 DIAGNOSIS — R972 Elevated prostate specific antigen [PSA]: Secondary | ICD-10-CM

## 2022-06-09 DIAGNOSIS — E538 Deficiency of other specified B group vitamins: Secondary | ICD-10-CM

## 2022-06-09 DIAGNOSIS — Z125 Encounter for screening for malignant neoplasm of prostate: Secondary | ICD-10-CM

## 2022-06-09 DIAGNOSIS — I700 Atherosclerosis of aorta: Secondary | ICD-10-CM | POA: Diagnosis not present

## 2022-06-09 DIAGNOSIS — Z7252 High risk homosexual behavior: Secondary | ICD-10-CM | POA: Diagnosis not present

## 2022-06-09 DIAGNOSIS — R0681 Apnea, not elsewhere classified: Secondary | ICD-10-CM

## 2022-06-09 DIAGNOSIS — K219 Gastro-esophageal reflux disease without esophagitis: Secondary | ICD-10-CM | POA: Diagnosis not present

## 2022-06-09 DIAGNOSIS — E785 Hyperlipidemia, unspecified: Secondary | ICD-10-CM

## 2022-06-09 DIAGNOSIS — M2352 Chronic instability of knee, left knee: Secondary | ICD-10-CM | POA: Insufficient documentation

## 2022-06-09 DIAGNOSIS — I7300 Raynaud's syndrome without gangrene: Secondary | ICD-10-CM | POA: Insufficient documentation

## 2022-06-09 DIAGNOSIS — G63 Polyneuropathy in diseases classified elsewhere: Secondary | ICD-10-CM | POA: Diagnosis not present

## 2022-06-09 DIAGNOSIS — F411 Generalized anxiety disorder: Secondary | ICD-10-CM

## 2022-06-09 LAB — HEPATIC FUNCTION PANEL
ALT: 38 U/L (ref 0–53)
AST: 26 U/L (ref 0–37)
Albumin: 4.4 g/dL (ref 3.5–5.2)
Alkaline Phosphatase: 85 U/L (ref 39–117)
Bilirubin, Direct: 0.2 mg/dL (ref 0.0–0.3)
Total Bilirubin: 1 mg/dL (ref 0.2–1.2)
Total Protein: 6.9 g/dL (ref 6.0–8.3)

## 2022-06-09 LAB — LIPID PANEL
Cholesterol: 156 mg/dL (ref 0–200)
HDL: 53.3 mg/dL (ref >39.00)
LDL Cholesterol: 83 mg/dL (ref 0–99)
NonHDL: 102.77
Total CHOL/HDL Ratio: 3
Triglycerides: 99 mg/dL (ref 0.0–149.0)
VLDL: 19.8 mg/dL (ref 0.0–40.0)

## 2022-06-09 LAB — URINALYSIS, ROUTINE W REFLEX MICROSCOPIC
Bilirubin Urine: NEGATIVE
Hgb urine dipstick: NEGATIVE
Ketones, ur: NEGATIVE
Leukocytes,Ua: NEGATIVE
Nitrite: NEGATIVE
RBC / HPF: NONE SEEN (ref 0–?)
Specific Gravity, Urine: 1.01 (ref 1.000–1.030)
Total Protein, Urine: NEGATIVE
Urine Glucose: NEGATIVE
Urobilinogen, UA: 0.2 (ref 0.0–1.0)
pH: 7.5 (ref 5.0–8.0)

## 2022-06-09 MED ORDER — DEXLANSOPRAZOLE 60 MG PO CPDR: 60.0000 mg | DELAYED_RELEASE_CAPSULE | Freq: Every day | ORAL | 1 refills | Status: DC

## 2022-06-09 MED ORDER — BUPROPION HCL ER (SR) 100 MG PO TB12: 100.0000 mg | ORAL_TABLET | Freq: Two times a day (BID) | ORAL | 0 refills | Status: DC

## 2022-06-09 MED ORDER — CARVEDILOL 3.125 MG PO TABS: 3.1250 mg | ORAL_TABLET | Freq: Two times a day (BID) | ORAL | 0 refills | Status: DC

## 2022-06-09 MED ORDER — ASPIRIN 81 MG PO TBEC: 81.0000 mg | DELAYED_RELEASE_TABLET | Freq: Every day | ORAL | 1 refills | Status: DC

## 2022-06-09 MED ORDER — IRBESARTAN-HYDROCHLOROTHIAZIDE 300-12.5 MG PO TABS: 1.0000 | ORAL_TABLET | Freq: Every day | ORAL | 0 refills | Status: DC

## 2022-06-09 MED ORDER — ATORVASTATIN CALCIUM 40 MG PO TABS: 40.0000 mg | ORAL_TABLET | Freq: Every day | ORAL | 1 refills | Status: DC

## 2022-06-09 NOTE — Progress Notes (Signed)
Subjective:  Patient ID: Caleb Obrien, male    DOB: [birth year]  Age: 56 y.o. MRN: [id number]  CC: Annual Exam, Hypertension, Hyperlipidemia, and Osteoarthritis   HPI Caleb Obrien presents for a CPX and f/up -    He is active and denies chest pain, shortness of breath, or diaphoresis.  He tells me that his blood pressure is not well-controlled and he has had headaches recently.  He does not consistently take carvedilol twice a day.  He also complains of chronic left knee pain and for 6 weeks feels like the knee has become unstable.  He has a several year history of color changes in his fingers and is concerned he has Raynaud's disease.  Outpatient Medications Prior to Visit  Medication Sig Dispense Refill   ALPRAZolam (XANAX) 0.25 MG tablet TAKE ONE HALF TABLET BY MOUTH AS NEEDED 30 tablet 2   cetirizine (ZYRTEC) 10 MG tablet TAKE 1 TABLET(10 MG) BY MOUTH DAILY 30 tablet 11   Cholecalciferol 2000 units TABS Take 1 tablet (2,000 Units total) by mouth daily. 90 tablet 1   emtricitabine-tenofovir (TRUVADA) 200-300 MG tablet Take 1 tablet by mouth daily. 90 tablet 0   tadalafil (CIALIS) 20 MG tablet TAKE ONE TABLET BY MOUTH DAILY AS NEEDED FOR ERECTILE DYSFUNCTION 30 tablet 1   Testosterone (ANDROGEL PUMP) 20.25 MG/ACT (1.62%) GEL Place 1 Act onto the skin daily. 225 g 0   albuterol (VENTOLIN HFA) 108 (90 Base) MCG/ACT inhaler Inhale 1-2 puffs into the lungs every 6 (six) hours as needed for wheezing or shortness of breath. 18 g 5   aspirin EC 81 MG tablet Take 1 tablet (81 mg total) by mouth daily. Swallow whole. 90 tablet 1   atorvastatin (LIPITOR) 40 MG tablet Take 1 tablet (40 mg total) by mouth daily. 90 tablet 1   buPROPion ER (WELLBUTRIN SR) 100 MG 12 hr tablet Take 1 tablet (100 mg total) by mouth 2 (two) times daily. 180 tablet 0   carvedilol (COREG) 3.125 MG tablet Take 1 tablet (3.125 mg total) by mouth 2 (two) times daily with a meal. 180 tablet 0   dexlansoprazole  (DEXILANT) 60 MG capsule Take 1 capsule (60 mg total) by mouth daily. 90 capsule 1   irbesartan-hydrochlorothiazide (AVALIDE) 300-12.5 MG tablet TAKE 1 TABLET BY MOUTH DAILY 90 tablet 0   No facility-administered medications prior to visit.    ROS Review of Systems  Constitutional: Negative.  Negative for chills, diaphoresis, fatigue and fever.  HENT: Negative.    Eyes: Negative.  Negative for visual disturbance.  Respiratory:  Negative for cough, chest tightness, shortness of breath and wheezing.   Cardiovascular:  Negative for chest pain, palpitations and leg swelling.  Gastrointestinal:  Negative for abdominal pain, constipation, diarrhea, nausea and vomiting.  Endocrine: Negative.   Genitourinary: Negative.  Negative for difficulty urinating, penile swelling and scrotal swelling.  Musculoskeletal:  Positive for arthralgias. Negative for joint swelling and myalgias.  Skin:  Positive for color change. Negative for rash.  Allergic/Immunologic: Negative.   Neurological:  Positive for headaches. Negative for dizziness, weakness and light-headedness.  Hematological:  Negative for adenopathy. Does not bruise/bleed easily.  Psychiatric/Behavioral:  Negative for sleep disturbance. The patient is nervous/anxious.     Objective:  BP (!) 142/90 (BP Location: Right Arm, Patient Position: Sitting, Cuff Size: Large)   Pulse 90   Temp 98.9 F (37.2 C) (Oral)   Ht '5\' 7"'$  (1.702 m)   Wt 173 lb (78.5 kg)  SpO2 97%   BMI 27.10 kg/m   BP Readings from Last 3 Encounters:  06/09/22 (!) 142/90  05/09/22 138/80  03/29/22 136/84    Wt Readings from Last 3 Encounters:  06/09/22 173 lb (78.5 kg)  05/09/22 175 lb 6.4 oz (79.6 kg)  03/29/22 166 lb (75.3 kg)    Physical Exam Vitals reviewed.  HENT:     Mouth/Throat:     Mouth: Mucous membranes are moist.  Eyes:     General: No scleral icterus.    Conjunctiva/sclera: Conjunctivae normal.  Cardiovascular:     Rate and Rhythm: Normal rate  and regular rhythm.     Heart sounds: Normal heart sounds, S1 normal and S2 normal. No murmur heard.    No friction rub. No gallop.     Comments: EKG- NSR, 86 bpm No LVH, Q waves, or ST/T wave changes Pulmonary:     Effort: Pulmonary effort is normal.     Breath sounds: No stridor. No wheezing, rhonchi or rales.  Abdominal:     General: Abdomen is flat. There is no distension.     Palpations: There is no mass.     Tenderness: There is no abdominal tenderness. There is no guarding.     Hernia: No hernia is present. There is no hernia in the left inguinal area or right inguinal area.  Genitourinary:    Pubic Area: No rash.      Penis: Normal and circumcised.      Testes: Normal.     Epididymis:     Right: Normal.     Left: Normal.  Musculoskeletal:        General: Normal range of motion.     Cervical back: Neck supple.     Right lower leg: No edema.     Left lower leg: No edema.  Lymphadenopathy:     Cervical: No cervical adenopathy.     Lower Body: No right inguinal adenopathy. No left inguinal adenopathy.  Skin:    General: Skin is warm and dry.  Neurological:     General: No focal deficit present.     Mental Status: He is alert. Mental status is at baseline.  Psychiatric:        Mood and Affect: Mood normal.        Behavior: Behavior normal.     Lab Results  Component Value Date   WBC 6.3 03/29/2022   HGB 15.0 03/29/2022   HCT 44.4 03/29/2022   PLT 277.0 03/29/2022   GLUCOSE 90 03/29/2022   CHOL 156 06/09/2022   TRIG 99.0 06/09/2022   HDL 53.30 06/09/2022   LDLDIRECT 144.4 01/27/2012   LDLCALC 83 06/09/2022   ALT 38 06/09/2022   AST 26 06/09/2022   NA 138 03/29/2022   K 3.6 03/29/2022   CL 98 03/29/2022   CREATININE 0.90 03/29/2022   BUN 14 03/29/2022   CO2 31 03/29/2022   TSH 3.41 06/09/2022   PSA 3.99 06/09/2022    CT CHEST LUNG CA SCREEN LOW DOSE W/O CM  Result Date: 03/18/2022 CLINICAL DATA:  Former smoker, quit August 2023, 24 pack-year history.  EXAM: CT CHEST WITHOUT CONTRAST LOW-DOSE FOR LUNG CANCER SCREENING TECHNIQUE: Multidetector CT imaging of the chest was performed following the standard protocol without IV contrast. RADIATION DOSE REDUCTION: This exam was performed according to the departmental dose-optimization program which includes automated exposure control, adjustment of the mA and/or kV according to patient size and/or use of iterative reconstruction technique. COMPARISON:  None Available.  FINDINGS: Cardiovascular: Atherosclerotic calcification of the aorta. Heart size normal. No pericardial effusion. Mediastinum/Nodes: No pathologically enlarged mediastinal or axillary lymph nodes. Hilar regions are difficult to definitively evaluate without IV contrast. Esophagus is grossly unremarkable. Lungs/Pleura: Centrilobular emphysema. No suspicious pulmonary nodules. No pleural fluid. Airway is unremarkable. Upper Abdomen: Visualized portions of the liver, gallbladder, adrenal glands, kidneys, spleen, pancreas, stomach and bowel are grossly unremarkable. No upper abdominal adenopathy. Musculoskeletal: Degenerative changes in the spine. No worrisome lytic or sclerotic lesions. IMPRESSION: 1. Lung-RADS 1, negative. Continue annual screening with low-dose chest CT without contrast in 12 months. 2.  Aortic atherosclerosis (ICD10-I70.0). 3.  Emphysema (ICD10-J43.9). Electronically Signed   By: Lorin Picket M.D.   On: 03/18/2022 11:10   Assessment & Plan:   Caleb Obrien was seen today for annual exam, hypertension, hyperlipidemia and osteoarthritis.  Diagnoses and all orders for this visit:  Vitamin B12 deficiency neuropathy ([place])- B12 and folate are normal. -     Folate; Future -     Vitamin B12; Future -     Vitamin B12 -     Folate  Hyperlipidemia with target LDL less than 130- LDL goal achieved. Doing well on the statin  -     atorvastatin (LIPITOR) 40 MG tablet; Take 1 tablet (40 mg total) by mouth daily. -     Lipid panel; Future -      TSH; Future -     Hepatic function panel; Future -     Hepatic function panel -     TSH -     Lipid panel  GERD with apnea- Symptoms are well-controlled. -     dexlansoprazole (DEXILANT) 60 MG capsule; Take 1 capsule (60 mg total) by mouth daily.  Atherosclerosis of aorta ([place])- Will address risk factor modifications. -     aspirin EC 81 MG tablet; Take 1 tablet (81 mg total) by mouth daily. Swallow whole. -     Lipid panel; Future -     Lipid panel  Essential hypertension, benign- His blood pressure is not adequately well-controlled.  I have asked him to take carvedilol more consistently. -     carvedilol (COREG) 3.125 MG tablet; Take 1 tablet (3.125 mg total) by mouth 2 (two) times daily with a meal. -     irbesartan-hydrochlorothiazide (AVALIDE) 300-12.5 MG tablet; Take 1 tablet by mouth daily. -     Urinalysis, Routine w reflex microscopic; Future -     EKG 12-Lead -     Urinalysis, Routine w reflex microscopic  GAD (generalized anxiety disorder) -     buPROPion ER (WELLBUTRIN SR) 100 MG 12 hr tablet; Take 1 tablet (100 mg total) by mouth 2 (two) times daily.  Encounter for general adult medical examination with abnormal findings- Exam completed, labs reviewed, vaccines reviewed and updated, cancer screenings are up-to-date. -     PSA; Future -     HIV Antibody (routine testing w rflx); Future -     HIV Antibody (routine testing w rflx) -     PSA  High risk homosexual behavior- His labs are normal.  He can continue taking PrEP. -     RPR; Future -     Hepatitis B surface antigen; Future -     Hepatitis B surface antigen -     RPR  Raynaud's phenomenon without gangrene -     Ambulatory referral to Rheumatology  Recurrent left knee instability -     Ambulatory referral to Sports Medicine  Rising  PSA level- I have asked him to come back in a few weeks to recheck this with no ejaculation for at least 3 days. -     PSA, total and free; Future   I have discontinued Jeneen Rinks  C. Kantz "Chris"'s albuterol. I am also having him maintain his cetirizine, Cholecalciferol, ALPRAZolam, Testosterone, tadalafil, emtricitabine-tenofovir, atorvastatin, dexlansoprazole, aspirin EC, carvedilol, irbesartan-hydrochlorothiazide, and buPROPion ER.  Meds ordered this encounter  Medications   atorvastatin (LIPITOR) 40 MG tablet    Sig: Take 1 tablet (40 mg total) by mouth daily.    Dispense:  90 tablet    Refill:  1   dexlansoprazole (DEXILANT) 60 MG capsule    Sig: Take 1 capsule (60 mg total) by mouth daily.    Dispense:  90 capsule    Refill:  1   aspirin EC 81 MG tablet    Sig: Take 1 tablet (81 mg total) by mouth daily. Swallow whole.    Dispense:  90 tablet    Refill:  1   carvedilol (COREG) 3.125 MG tablet    Sig: Take 1 tablet (3.125 mg total) by mouth 2 (two) times daily with a meal.    Dispense:  180 tablet    Refill:  0   irbesartan-hydrochlorothiazide (AVALIDE) 300-12.5 MG tablet    Sig: Take 1 tablet by mouth daily.    Dispense:  90 tablet    Refill:  0   buPROPion ER (WELLBUTRIN SR) 100 MG 12 hr tablet    Sig: Take 1 tablet (100 mg total) by mouth 2 (two) times daily.    Dispense:  180 tablet    Refill:  0     Follow-up: Return in about 3 months (around 09/08/2022).  Scarlette Calico, MD

## 2022-06-09 NOTE — Patient Instructions (Signed)
Health Maintenance, Male Adopting a healthy lifestyle and getting preventive care are important in promoting health and wellness. Ask your health care provider about: The right schedule for you to have regular tests and exams. Things you can do on your own to prevent diseases and keep yourself healthy. What should I know about diet, weight, and exercise? Eat a healthy diet  Eat a diet that includes plenty of vegetables, fruits, low-fat dairy products, and lean protein. Do not eat a lot of foods that are high in solid fats, added sugars, or sodium. Maintain a healthy weight Body mass index (BMI) is a measurement that can be used to identify possible weight problems. It estimates body fat based on height and weight. Your health care provider can help determine your BMI and help you achieve or maintain a healthy weight. Get regular exercise Get regular exercise. This is one of the most important things you can do for your health. Most adults should: Exercise for at least 150 minutes each week. The exercise should increase your heart rate and make you sweat (moderate-intensity exercise). Do strengthening exercises at least twice a week. This is in addition to the moderate-intensity exercise. Spend less time sitting. Even light physical activity can be beneficial. Watch cholesterol and blood lipids Have your blood tested for lipids and cholesterol at 54 years of age, then have this test every 5 years. You may need to have your cholesterol levels checked more often if: Your lipid or cholesterol levels are high. You are older than 54 years of age. You are at high risk for heart disease. What should I know about cancer screening? Many types of cancers can be detected early and may often be prevented. Depending on your health history and family history, you may need to have cancer screening at various ages. This may include screening for: Colorectal cancer. Prostate cancer. Skin cancer. Lung  cancer. What should I know about heart disease, diabetes, and high blood pressure? Blood pressure and heart disease High blood pressure causes heart disease and increases the risk of stroke. This is more likely to develop in people who have high blood pressure readings or are overweight. Talk with your health care provider about your target blood pressure readings. Have your blood pressure checked: Every 3-5 years if you are 18-39 years of age. Every year if you are 40 years old or older. If you are between the ages of 65 and 75 and are a current or former smoker, ask your health care provider if you should have a one-time screening for abdominal aortic aneurysm (AAA). Diabetes Have regular diabetes screenings. This checks your fasting blood sugar level. Have the screening done: Once every three years after age 45 if you are at a normal weight and have a low risk for diabetes. More often and at a younger age if you are overweight or have a high risk for diabetes. What should I know about preventing infection? Hepatitis B If you have a higher risk for hepatitis B, you should be screened for this virus. Talk with your health care provider to find out if you are at risk for hepatitis B infection. Hepatitis C Blood testing is recommended for: Everyone born from 1945 through 1965. Anyone with known risk factors for hepatitis C. Sexually transmitted infections (STIs) You should be screened each year for STIs, including gonorrhea and chlamydia, if: You are sexually active and are younger than 54 years of age. You are older than 54 years of age and your   health care provider tells you that you are at risk for this type of infection. Your sexual activity has changed since you were last screened, and you are at increased risk for chlamydia or gonorrhea. Ask your health care provider if you are at risk. Ask your health care provider about whether you are at high risk for HIV. Your health care provider  may recommend a prescription medicine to help prevent HIV infection. If you choose to take medicine to prevent HIV, you should first get tested for HIV. You should then be tested every 3 months for as long as you are taking the medicine. Follow these instructions at home: Alcohol use Do not drink alcohol if your health care provider tells you not to drink. If you drink alcohol: Limit how much you have to 0-2 drinks a day. Know how much alcohol is in your drink. In the [place] one drink equals one 12 oz bottle of beer (355 mL), one 5 oz glass of wine (148 mL), or one 1 oz glass of hard liquor (44 mL). Lifestyle Do not use any products that contain nicotine or tobacco. These products include cigarettes, chewing tobacco, and vaping devices, such as e-cigarettes. If you need help quitting, ask your health care provider. Do not use street drugs. Do not share needles. Ask your health care provider for help if you need support or information about quitting drugs. General instructions Schedule regular health, dental, and eye exams. Stay current with your vaccines. Tell your health care provider if: You often feel depressed. You have ever been abused or do not feel safe at home. Summary Adopting a healthy lifestyle and getting preventive care are important in promoting health and wellness. Follow your health care provider's instructions about healthy diet, exercising, and getting tested or screened for diseases. Follow your health care provider's instructions on monitoring your cholesterol and blood pressure. This information is not intended to replace advice given to you by your health care provider. Make sure you discuss any questions you have with your health care provider. Document Revised: 10/19/2020 Document Reviewed: 10/19/2020 Elsevier Patient Education  2023 [employer]  

## 2022-06-10 ENCOUNTER — Encounter: Payer: Self-pay | Admitting: Internal Medicine

## 2022-06-10 DIAGNOSIS — R972 Elevated prostate specific antigen [PSA]: Secondary | ICD-10-CM | POA: Insufficient documentation

## 2022-06-10 LAB — PSA: PSA: 3.99 ng/mL (ref 0.10–4.00)

## 2022-06-10 LAB — FOLATE: Folate: 9 ng/mL (ref >5.9)

## 2022-06-10 LAB — HEPATITIS B SURFACE ANTIGEN: Hepatitis B Surface Ag: NONREACTIVE

## 2022-06-10 LAB — HIV ANTIBODY (ROUTINE TESTING W REFLEX): HIV 1&2 Ab, 4th Generation: NONREACTIVE

## 2022-06-10 LAB — RPR: RPR Ser Ql: NONREACTIVE

## 2022-06-10 LAB — TSH: TSH: 3.41 u[IU]/mL (ref 0.35–5.50)

## 2022-06-10 LAB — VITAMIN B12: Vitamin B-12: 485 pg/mL (ref 211–911)

## 2022-06-11 ENCOUNTER — Encounter: Payer: Self-pay | Admitting: Internal Medicine

## 2022-06-11 MED ORDER — EMTRICITABINE-TENOFOVIR DF 200-300 MG PO TABS: 1.0000 | ORAL_TABLET | Freq: Every day | ORAL | 0 refills | Status: DC

## 2022-06-15 NOTE — Progress Notes (Unsigned)
   I, [name], LAT, ATC acting as a scribe for Lynne Leader, MD.  Subjective:    CC: Left knee pain  HPI: Patient is a 57 year old male presenting with left knee pain.  Patient was previously seen by Dr. Georgina Snell December 2021 for chronic left shoulder pain.  Today, patient reports reoccurring left knee pain/instability?  Patient locates pain to?  L Knee swelling: Mechanical symptoms: Radiates: Aggravates: Treatments tried:  Pertinent review of Systems: ***  Relevant historical information: ***   Objective:   There were no vitals filed for this visit. General: Well Developed, well nourished, and in no acute distress.   MSK: ***  Lab and Radiology Results No results found for this or any previous visit (from the past 72 hour(s)). No results found.    Impression and Recommendations:    Assessment and Plan: 57 y.o. male with ***.  PDMP not reviewed this encounter. No orders of the defined types were placed in this encounter.  No orders of the defined types were placed in this encounter.   Discussed warning signs or symptoms. Please see discharge instructions. Patient expresses understanding.   ***

## 2022-06-16 ENCOUNTER — Ambulatory Visit (INDEPENDENT_AMBULATORY_CARE_PROVIDER_SITE_OTHER): Payer: BC Managed Care – PPO | Admitting: Family Medicine

## 2022-06-16 ENCOUNTER — Ambulatory Visit (INDEPENDENT_AMBULATORY_CARE_PROVIDER_SITE_OTHER): Payer: BC Managed Care – PPO

## 2022-06-16 ENCOUNTER — Ambulatory Visit: Payer: Self-pay

## 2022-06-16 VITALS — BP 140/86 | HR 97 | Ht 67.0 in | Wt 171.0 lb

## 2022-06-16 DIAGNOSIS — G8929 Other chronic pain: Secondary | ICD-10-CM

## 2022-06-16 DIAGNOSIS — M25512 Pain in left shoulder: Secondary | ICD-10-CM

## 2022-06-16 DIAGNOSIS — M25562 Pain in left knee: Secondary | ICD-10-CM

## 2022-06-16 NOTE — Patient Instructions (Addendum)
Thank you for coming in today.   Please get an Xray today before you leave   Please use Voltaren gel (Generic Diclofenac Gel) up to 4x daily for pain as needed.  This is available over-the-counter as both the name brand Voltaren gel and the generic diclofenac gel.   You received an injection today. Seek immediate medical attention if the joint becomes red, extremely painful, or is oozing fluid.   Recheck in about 8 weeks.

## 2022-06-17 NOTE — Progress Notes (Signed)
Left knee x-ray shows a little bit of arthritis changes.  Otherwise looks okay.

## 2022-06-18 ENCOUNTER — Other Ambulatory Visit: Payer: Self-pay | Admitting: Internal Medicine

## 2022-06-18 DIAGNOSIS — K219 Gastro-esophageal reflux disease without esophagitis: Secondary | ICD-10-CM

## 2022-06-27 ENCOUNTER — Other Ambulatory Visit: Payer: Self-pay | Admitting: Internal Medicine

## 2022-06-27 ENCOUNTER — Encounter: Payer: Self-pay | Admitting: Internal Medicine

## 2022-06-27 DIAGNOSIS — R972 Elevated prostate specific antigen [PSA]: Secondary | ICD-10-CM

## 2022-06-27 DIAGNOSIS — I10 Essential (primary) hypertension: Secondary | ICD-10-CM

## 2022-06-27 MED ORDER — AZILSARTAN MEDOXOMIL 80 MG PO TABS: 1.0000 | ORAL_TABLET | Freq: Every day | ORAL | 0 refills | Status: DC

## 2022-06-27 MED ORDER — INDAPAMIDE 2.5 MG PO TABS: 2.5000 mg | ORAL_TABLET | Freq: Every day | ORAL | 0 refills | Status: DC

## 2022-06-27 NOTE — Progress Notes (Unsigned)
   I, Caleb Obrien, LAT, ATC acting as a scribe for Caleb Leader, MD.  Caleb Obrien is a 57 y.o. male who presents to [facility] today for cont'd L arm pain. Pt injured his L armwhen he was putting his Christmas tree away in the attic and heard an audible pop. Pt was last seen by Dr. Georgina Snell on 06/16/22 and his L shoulder was added to his PT order. Today, pt reports  Dx imaging: 06/03/20 L shoulder XR  Pertinent review of systems: ***  Relevant historical information: ***   Exam:  There were no vitals taken for this visit. General: Well Developed, well nourished, and in no acute distress.   MSK: ***    Lab and Radiology Results No results found for this or any previous visit (from the past 72 hour(s)). No results found.     Assessment and Plan: 57 y.o. male with ***   PDMP not reviewed this encounter. No orders of the defined types were placed in this encounter.  No orders of the defined types were placed in this encounter.    Discussed warning signs or symptoms. Please see discharge instructions. Patient expresses understanding.   ***

## 2022-06-28 ENCOUNTER — Ambulatory Visit (INDEPENDENT_AMBULATORY_CARE_PROVIDER_SITE_OTHER): Payer: BC Managed Care – PPO | Admitting: Family Medicine

## 2022-06-28 ENCOUNTER — Encounter: Payer: Self-pay | Admitting: Internal Medicine

## 2022-06-28 ENCOUNTER — Ambulatory Visit: Payer: Self-pay

## 2022-06-28 ENCOUNTER — Other Ambulatory Visit (HOSPITAL_COMMUNITY): Payer: Self-pay

## 2022-06-28 VITALS — BP 138/88 | HR 91 | Ht 67.0 in | Wt 166.0 lb

## 2022-06-28 DIAGNOSIS — M25512 Pain in left shoulder: Secondary | ICD-10-CM | POA: Diagnosis not present

## 2022-06-28 DIAGNOSIS — M66322 Spontaneous rupture of flexor tendons, left upper arm: Secondary | ICD-10-CM | POA: Diagnosis not present

## 2022-06-28 DIAGNOSIS — G8929 Other chronic pain: Secondary | ICD-10-CM

## 2022-06-28 NOTE — Patient Instructions (Signed)
Thank you for coming in today.   Continue to PT.   Recheck as needed.   Proximal Biceps Tendon Tear Rehab Ask your health care provider which exercises are safe for you. Do exercises exactly as told by your health care provider and adjust them as directed. It is normal to feel mild stretching, pulling, tightness, or discomfort as you do these exercises. Stop right away if you feel sudden pain or your pain gets worse. Do not begin these exercises until told by your health care provider. Stretching and range-of-motion exercises These exercises warm up your muscles and joints and improve the movement and flexibility of your arm and shoulder. The exercises also help to relieve pain and stiffness. Shoulder pendulum  Stand near a table or counter that you can hold onto for balance. Bend forward at the waist and let your left / right arm hang straight down. Use your other arm to support you and help you stay balanced. Relax your left / right arm and shoulder muscles, and move your hips and your trunk so your left / right arm swings freely. Your arm should swing because of the motion of your body, not because you are using your arm or shoulder muscles. Keep moving your hips and trunk so your arm swings in the following directions, as told by your health care provider: Side to side. Forward and backward. In clockwise and counterclockwise circles. Repeat __________ times. Complete this exercise __________ times a day. Single-arm shoulder flexion, assisted You will need a stick, broom handle, or similar object to help (assist) in doing this exercise. Lie on your back. Grasp the bottom of the stick in your left / right hand. Using the stick, raise your arm overhead until you feel a gentle stretch (flexion). Hold this position for _________ seconds. Return to the starting position. Repeat __________ times. Complete this exercise __________ times a day. Double-arm shoulder flexion, assisted You will  need a stick, broom handle, or similar object to help (assist) in doing this exercise. Lie on your back. Hold the stick in both hands, with your hands shoulder-width apart. Raise both hands over your head, stopping when you feel a gentle stretch in your left / right shoulder (flexion). Hold this position for _________ seconds. Return to the starting position. Repeat __________ times. Complete this exercise __________ times a day. Shoulder flexion, assisted  Stand facing a wall. Put your left / right palm on the wall. Slowly move your left / right hand up the wall (flexion). Stop when you feel a gentle stretch in your shoulder, or when you reach the angle that is recommended by your health care provider. Use your other hand to help raise your arm, if needed (assisted). As your hand gets higher, you may need to step closer to the wall. Avoid shrugging or lifting your shoulder up as you raise your arm. To do this, keep your shoulder blade tucked down toward your spine. Hold this position for __________ seconds. Slowly return to the starting position. Use your other arm to help, if needed. Repeat __________ times. Complete this exercise __________ times a day. Shoulder abduction, assisted You will need a stick, broom handle, or similar object to help you (assist) in doing this exercise. Lie on your back. Grasp the bottom of the stick in your left / right hand. Your thumb should be pointing upward. Using the stick, slowly push your arm away from your side (abduction) and over your head until you feel a gentle stretch. Hold  this position for __________ seconds. Return to the starting position. Repeat __________ times. Complete this exercise __________ times a day. Elbow extension stretch You may do this exercise in two ways: Lie on your back and rest your elbow off the edge of the bed. Sit at a table with your upper arm supported as if you are lying down, and let your elbow rest off the  table. Let the weight of your hand, wrist, and forearm straighten your elbow (extension) until you feel a gentle stretch. If approved by your health care provider, you may hold a _________ lb / kg weight in your hand to help you stretch farther. Hold this position for _________ seconds. Slowly return to the starting position. Repeat __________ times. Complete this exercise __________ times a day. Strengthening exercises These exercises build strength and endurance in your arm and shoulder. Endurance is the ability to use your muscles for a long time, even after they get tired. Do not start these exercises until instructed to do so by your health care provider. Forearm rotation  Sit with your left / right forearm supported on a table. Your elbow should be at waist height. If directed, hold a hammer in your left / right hand. Rest your hand over the edge of the table so your palm faces down. Without moving your left / right elbow, slowly rotate your hand so your palm faces up toward the ceiling. If you are holding a hammer, begin by holding the hammer near the head. When this exercise gets easier for you, hold the hammer farther down the handle. Hold this position for __________ seconds. Slowly return to the starting position. Repeat __________ times. Complete this exercise __________ times a day. Isometric elbow flexion Stand or sit with your left / right arm at waist height. Your palm should face in, toward your body. Place your other hand on top of your left / right forearm. Gently push down while you resist with your left / right arm (isometric flexion). Use about 50% effort with both arms. You may be instructed to use more and more effort with your arms each week. Try not to let your left / right elbow move during the exercise. Hold this position for _________ seconds. Let your muscles relax completely before you repeat this exercise. Repeat __________ times. Complete this exercise  __________ times a day. Biceps curls You may use a weight or an exercise band to do this exercise. Sit on a stable chair without armrests, or stand up. Hold a __________ lb / kg weight in your left / right hand, or hold an exercise band with both hands. Your palms should face up toward the ceiling at the starting position. Bend your left / right elbow and move your hand up toward your shoulder. Keep your other arm straight down, in the starting position. Hold this position for __________ seconds. Slowly return to the starting position. Repeat __________ times. Complete this exercise __________ times a day. Hammer curls You may use a weight or an exercise band to do this exercise. Sit on a stable chair without armrests, or stand up. Hold a __________ lb / kg weight in your left / right hand, or hold an exercise band with both hands. Your palms should face each other at the starting position. Keeping your other arm straight, bend your left / right elbow and move your hand up toward your shoulder. Keep your elbow at your side while you bend it. Slowly return to the starting position.  Repeat __________ times. Complete this exercise __________ times a day. Isometric shoulder flexion Stand facing a wall. Make a light fist and put your left / right hand on the wall, thumb toward the wall. Push against the wall, trying to raise your arm straight in front of you (flexion). The wall will stop any motion (isometric flexion). Hold this position for __________ seconds. Let your arm muscles relax completely before you repeat this exercise. Repeat __________ times. Complete this exercise __________ times a day. Shoulder flexion You may use a weight or an exercise band to do this exercise. Stand holding a _________ lb / kg weight in your left / right hand, or hold an exercise band in both hands. Slowly raise your left / right arm overhead as far as you can (flexion) without feeling pain. Do not allow your  shoulder to lift up while doing this exercise. Keep your thumb pointing up to the ceiling. Hold this position for __________ seconds. Slowly return to the starting position. Repeat __________ times. Complete this exercise __________ times a day. Scapular retraction Scapular retraction is the process of pulling the shoulder blades (scapulae) toward each other, and toward the spine. You will need an exercise band to do this exercise. Sit in a stable chair without armrests, or stand up. Secure an exercise band to a stable object in front of you so the band is at shoulder height. Hold one end of the exercise band in each hand. Squeeze your shoulder blades together and move your elbows slightly behind you (retraction). Do not shrug your shoulders upward while you do this. Hold this position for __________ seconds. Slowly return to the starting position. Repeat __________ times. Complete this exercise __________ times a day. Scapular protraction, supine Scapular protraction is the process of moving your shoulder blades away from each other, and away from the spine, while you lie on your back (supine position). Lie on your back on a firm surface. Hold a __________ lb / kg weight in your left / right hand. Raise your left / right arm straight into the air so your hand is directly above your shoulder joint. Push the weight into the air so your shoulder (scapula) lifts off the surface that you are lying on. Think of trying to punch the ceiling by only moving your scapula forward (protraction). Do not move your head, neck, or back. Hold this position for __________ seconds. Slowly return to the starting position. Repeat __________ times. Complete this exercise __________ times a day. This information is not intended to replace advice given to you by your health care provider. Make sure you discuss any questions you have with your health care provider. Document Revised: 07/22/2020 Document Reviewed:  07/22/2020 Elsevier Patient Education  [employer].

## 2022-06-29 ENCOUNTER — Other Ambulatory Visit (INDEPENDENT_AMBULATORY_CARE_PROVIDER_SITE_OTHER): Payer: BC Managed Care – PPO

## 2022-06-29 ENCOUNTER — Encounter: Payer: Self-pay | Admitting: Internal Medicine

## 2022-06-29 ENCOUNTER — Other Ambulatory Visit: Payer: Self-pay | Admitting: Internal Medicine

## 2022-06-29 DIAGNOSIS — R972 Elevated prostate specific antigen [PSA]: Secondary | ICD-10-CM

## 2022-06-29 LAB — PSA: PSA: 5.65 ng/mL — ABNORMAL HIGH (ref 0.10–4.00)

## 2022-06-30 ENCOUNTER — Telehealth: Payer: Self-pay

## 2022-06-30 LAB — PSA, TOTAL AND FREE
PSA, % Free: 6 % (calc) — ABNORMAL LOW (ref >25)
PSA, Free: 0.3 ng/mL
PSA, Total: 4.7 ng/mL — ABNORMAL HIGH (ref <=4.0)

## 2022-06-30 NOTE — Telephone Encounter (Signed)
Key: [id number]

## 2022-07-04 ENCOUNTER — Other Ambulatory Visit: Payer: Self-pay | Admitting: Internal Medicine

## 2022-07-04 ENCOUNTER — Encounter: Payer: Self-pay | Admitting: Urology

## 2022-07-04 ENCOUNTER — Encounter: Payer: Self-pay | Admitting: Internal Medicine

## 2022-07-04 ENCOUNTER — Ambulatory Visit (INDEPENDENT_AMBULATORY_CARE_PROVIDER_SITE_OTHER): Payer: BC Managed Care – PPO | Admitting: Urology

## 2022-07-04 VITALS — BP 152/96 | HR 96 | Ht 68.0 in | Wt 161.0 lb

## 2022-07-04 DIAGNOSIS — N3943 Post-void dribbling: Secondary | ICD-10-CM

## 2022-07-04 DIAGNOSIS — Z8042 Family history of malignant neoplasm of prostate: Secondary | ICD-10-CM

## 2022-07-04 DIAGNOSIS — I10 Essential (primary) hypertension: Secondary | ICD-10-CM

## 2022-07-04 DIAGNOSIS — R972 Elevated prostate specific antigen [PSA]: Secondary | ICD-10-CM | POA: Diagnosis not present

## 2022-07-04 LAB — URINALYSIS
Blood, UA: NEGATIVE
Glucose, UA: NEGATIVE mg/dL
Leukocytes, UA: NEGATIVE
Nitrite, UA: NEGATIVE
Protein Ur, POC: 100 mg/dL — AB
Spec Grav, UA: >=1.03 — AB (ref 1.010–1.025)
Urobilinogen, UA: 0.2 E.U./dL
pH, UA: 6 (ref 5.0–8.0)

## 2022-07-04 LAB — BLADDER SCAN AMB NON-IMAGING

## 2022-07-04 MED ORDER — IRBESARTAN 300 MG PO TABS: 300.0000 mg | ORAL_TABLET | Freq: Every day | ORAL | 0 refills | Status: DC

## 2022-07-04 NOTE — Therapy (Signed)
OUTPATIENT PHYSICAL THERAPY EVALUATION   Patient Name: Caleb Obrien MRN: [id number] DOB:[birth year], 57 y.o., male Today's Date: 07/05/2022  END OF SESSION:  PT End of Session - 07/05/22 0757     Visit Number 1    Number of Visits 20    Date for PT Re-Evaluation 09/13/22    Authorization Type [employer] 50% coinsurance 30 visit    PT Start Time 0800    PT Stop Time 0839    PT Time Calculation (min) 39 min    Activity Tolerance Patient tolerated treatment well    Behavior During Therapy [facility] for tasks assessed/performed             Past Medical History:  Diagnosis Date   ALLERGIC RHINITIS CAUSE UNSPECIFIED 02/24/2009   Allergy    ASTHMA 02/05/2007   BENIGN PROSTATIC HYPERTROPHY, WITH URINARY OBSTRUCTION 05/22/2007   Dermatophytosis of foot 11/02/2009   GANGLION CYST, WRIST, LEFT 03/25/2009   Hemangioma of skin and subcutaneous tissue 06/30/2009   HEPATITIS B, HX OF 02/05/2007   per pt no history   HYPERLIPIDEMIA, BORDERLINE 04/21/2008   PLANTAR WART, RIGHT 01/23/2009   Sleep apnea    uses c-pap   Past Surgical History:  Procedure Laterality Date   eyelid surgery  04/11/2018   Bil   WISDOM TOOTH EXTRACTION     Patient Active Problem List   Diagnosis Date Noted   Nontraumatic rupture of left proximal biceps tendon 06/28/2022   Elevated PSA 06/10/2022   Raynaud's phenomenon without gangrene 06/09/2022   Recurrent left knee instability 06/09/2022   Atherosclerosis of aorta ([place]) 03/29/2022   Panlobular emphysema ([place]) 03/29/2022   Encounter for general adult medical examination with abnormal findings 06/03/2020   Hypogonadism in male 06/03/2020   Vitamin D insufficiency 12/27/2017   Depression with anxiety 08/22/2017   Vitamin B12 deficiency neuropathy ([place]) 09/08/2016   GERD with apnea 04/26/2016   High risk homosexual behavior 04/12/2016   Hyperlipidemia with target LDL less than 130 04/28/2015   Drug-induced erectile dysfunction 04/27/2015   Essential  hypertension, benign 02/08/2014   Mild persistent asthma 02/08/2014   OSA (obstructive sleep apnea) 07/12/2013   GAD (generalized anxiety disorder) 02/05/2007    PCP: Janith Lima. MD  REFERRING PROVIDER: Gregor Hams, MD  REFERRING DIAG: [phone] (ICD-10-CM) - Chronic pain of left knee M25.512,G89.29 (ICD-10-CM) - Chronic left shoulder pain  THERAPY DIAG:  Chronic left shoulder pain  Chronic pain of left knee  Muscle weakness (generalized)  Rationale for Evaluation and Treatment: Rehabilitation  ONSET DATE: 05/2022  SUBJECTIVE:   SUBJECTIVE STATEMENT: He indicated chief complaint of Lt knee pain "grabbing" with stepping the wrong way at times.  He indicated pain in that moment.  Had injection that helped some but has started to wear off.  Pain is quick and goes away quickly.   Indicated complaints in Lt shoulder/arm after putting Christmas tree away in attic and heard pop.  He indicated history of Lt shoulder pain in past.  He indicated repetitive stress on Lt shoulder can be troublesome.   PERTINENT HISTORY: [facility] Ultrasound of: Left shoulder biceps tendon: Biceps tendon in bicipital groove is abnormal appearing Proximally is either thin or absent and more distally is thick and larger than would be normally expected and appears to be coiled Impression: Concern for proximal biceps long head tendon tear PAIN:  NPRS scale: at worst 8/10 knee,  Pain location: Lt knee, Lt shoulder Pain description: quick pain Lt knee , Lt  shoulder Aggravating factors: Knee:  stepping up, moving the wrong way, shoulder Relieving factors: injection helped Lt knee short term  PRECAUTIONS: None  WEIGHT BEARING RESTRICTIONS: No  FALLS:  Has patient fallen in last 6 months? No  LIVING ENVIRONMENT: Lives in: House/apartment Stairs: 3 stairs to enter   OCCUPATION: Desk job  PLOF: Independent, Lt hand dominant  PATIENT GOALS: Improve pain complaints, get  exercises for home   OBJECTIVE:   PATIENT SURVEYS:  07/05/2022 FOTO intake: 65   predicted:  72  COGNITION: 07/05/2022 Overall cognitive status: WFL    SENSATION: 07/05/2022 No specific testing.   MUSCLE LENGTH: 07/05/2022 No specific testing  POSTURE:  07/05/2022 unremarkable  in sitting/standing  PALPATION: 07/05/2022 Mild tenderness in distal lateral Lt quad, IT band around femoral epicondyle laterally.  Trigger points in Lt infraspinatus  UPPER EXTREMITY ROM:   ROM Right 07/05/2022 Left 07/05/2022  Shoulder flexion [facility]  Shoulder extension    Shoulder abduction [facility]  Shoulder adduction    Shoulder extension    Shoulder internal rotation    Shoulder external rotation    Elbow flexion    Elbow extension    Wrist flexion    Wrist extension    Wrist ulnar deviation    Wrist radial deviation    Wrist pronation    Wrist supination     (Blank rows = not tested) UPPER EXTREMITY MMT:  MMT Right 07/05/2022 Left 07/05/2022  Shoulder flexion 5/5 5/5  Shoulder extension    Shoulder abduction 5/5 5/5  Shoulder adduction    Shoulder extension    Shoulder internal rotation 5/5 5/5  Shoulder external rotation 5/5 4/5  Middle trapezius    Lower trapezius    Elbow flexion    Elbow extension    Wrist flexion    Wrist extension    Wrist ulnar deviation    Wrist radial deviation    Wrist pronation    Wrist supination    Grip strength     (Blank rows = not tested)   LOWER EXTREMITY ROM:   ROM Right 07/05/2022 Left 07/05/2022  Hip flexion    Hip extension    Hip abduction    Hip adduction    Hip internal rotation    Hip external rotation    Knee flexion  WFL  Knee extension  WFL  Ankle dorsiflexion    Ankle plantarflexion    Ankle inversion    Ankle eversion     (Blank rows = not tested)  LOWER EXTREMITY MMT:  MMT Right 07/05/2022 Left 07/05/2022  Hip flexion 5/5 5/5  Hip extension    Hip abduction 5/5 4/5  Hip adduction    Hip internal  rotation    Hip external rotation    Knee flexion 5/5 5/5  Knee extension 5/5 55.5, 55.7 lbs 5/5 41.8, 47 lbs  Ankle dorsiflexion 5/5 5/5  Ankle plantarflexion    Ankle inversion    Ankle eversion     (Blank rows = not tested)  LOWER EXTREMITY SPECIAL TESTS:  07/05/2022 (-) Lt leg varus  FUNCTIONAL TESTS:  07/05/2022 18 inch chair transfer: able to perform s UE assist.  Several instances of attempts produced Lt knee pain laterally.  Lt SLS: 30 seconds c increased aberrant movements compared to Rt Rt SLS: 30 seconds  GAIT: 07/05/2022 Independent ambulation   TODAY'S TREATMENT  DATE:07/05/2022 Therex:    HEP instruction/performance c cues for techniques, handout provided.  Trial set performed of each for comprehension and symptom assessment.  See below for exercise list  PATIENT EDUCATION:  07/05/2022 Education details: HEP, POC Person educated: Patient Education method: Explanation, Demonstration, Verbal cues, and Handouts Education comprehension: verbalized understanding, returned demonstration, and verbal cues required  HOME EXERCISE PROGRAM: Access Code: [email or web address] URL: [email or web address] Date: 07/05/2022 Prepared by: Scot Jun  Exercises - Seated Quad Set (Mirrored)  - 3-5 x daily - 7 x weekly - 1 sets - 10 reps - 5 hold - Seated Straight Leg Heel Taps  - 2 x daily - 7 x weekly - 2-3 sets - 10-15 reps - Shoulder External Rotation with Anchored Resistance  - 2 x daily - 7 x weekly - 2-3 sets - 10-15 reps - Sit to Stand  - 3 x daily - 7 x weekly - 1 sets - 10 reps - Standing Hip Hiking  - 1-2 x daily - 7 x weekly - 1 sets - 10 reps - 5 hold  ASSESSMENT:  CLINICAL IMPRESSION: Patient is a 57 y.o. who comes to clinic with complaints of Lt knee, Lt shoulder pain with mobility, strength and movement coordination deficits that impair their ability to perform usual daily and recreational  functional activities without increase difficulty/symptoms at this time.  Patient to benefit from skilled PT services to address impairments and limitations to improve to previous level of function without restriction secondary to condition.   OBJECTIVE IMPAIRMENTS: decreased balance, decreased coordination, decreased endurance, difficulty walking, decreased strength, increased fascial restrictions, impaired perceived functional ability, increased muscle spasms, impaired flexibility, impaired UE functional use, improper body mechanics, and pain.   ACTIVITY LIMITATIONS: carrying, lifting, bending, sitting, squatting, stairs, transfers, reach over head, and locomotion level  PARTICIPATION LIMITATIONS: interpersonal relationship, community activity, and yard work  PERSONAL FACTORS:  no specific factors  are also affecting patient's functional outcome.   REHAB POTENTIAL: Good  CLINICAL DECISION MAKING: Stable/uncomplicated  EVALUATION COMPLEXITY: Low   GOALS: Goals reviewed with patient? Yes  SHORT TERM GOALS: (target date for Short term goals are 3 weeks 07/26/2022)   1.  Patient will demonstrate independent use of home exercise program to maintain progress from in clinic treatments.  Goal status: New  LONG TERM GOALS: (target dates for all long term goals are 10 weeks  09/13/2022 )   1. Patient will demonstrate/report pain at worst less than or equal to 2/10 to facilitate minimal limitation in daily activity secondary to pain symptoms.  Goal status: New   2. Patient will demonstrate independent use of home exercise program to facilitate ability to maintain/progress functional gains from skilled physical therapy services.  Goal status: New   3. Patient will demonstrate FOTO outcome > or = 72 % to indicate reduced disability due to condition.  Goal status: New   4.  Patient will demonstrate Lt LE MMT 5/5 throughout to faciltiate usual transfers, stairs, squatting at [facility] for daily  life.   Goal status: New   5.  Patient will demonstrate LT UE MMT 5/5 throughout to facilitate usual reaching, lifting, carrying at PLOF.  Goal status: New   6.  Patient will demonstrate transfers 18 inch chair, stairs reciprocally without UE assist to facilitate usual daily activity at PLOF.  Goal status: New    PLAN:  PT FREQUENCY: 1-2x/week  (Pt indicated more than 1x week was tough with schedule)  PT DURATION: 10 weeks  PLANNED INTERVENTIONS: Therapeutic  exercises, Therapeutic activity, Neuro Muscular re-education, Balance training, Gait training, Patient/Family education, Joint mobilization, Stair training, DME instructions, Dry Needling, Electrical stimulation, Traction, Cryotherapy, vasopneumatic deviceMoist heat, Taping, Ultrasound, Ionotophoresis '4mg'$ /ml Dexamethasone, and Manual therapy.  All included unless contraindicated  PLAN FOR NEXT SESSION: Review HEP knowledge/results.   Progressive quad strengthening (leg press, knee extension machine   Scot Jun, PT, DPT, OCS, ATC 07/05/22  9:04 AM

## 2022-07-04 NOTE — Addendum Note (Signed)
Addended by: Evelina Bucy on: 07/04/2022 09:45 AM   Modules accepted: Orders

## 2022-07-04 NOTE — Progress Notes (Signed)
Assessment: 1. Elevated PSA   2. Family history of prostate cancer in father   3. Post-void dribbling     Plan: I personally reviewed the patient's chart including provider notes, and lab results. Today I had a long discussion with the patient regarding PSA and the rationale and controversies of prostate cancer early detection.  I discussed the pros and cons of further evaluation including TRUS and prostate Bx.  Potential adverse events and complications as well as standard instructions were given.  Patient expressed his understanding of these issues. Schedule for TRUS/BX  Chief Complaint:  Chief Complaint  Patient presents with   Elevated PSA    History of Present Illness:  Caleb Obrien is a 57 y.o. male who is seen in consultation from Janith Lima, MD for evaluation of elevated PSA. PSA results: 06/30/22 4.7 with 6% free 06/29/22 5.65 06/09/22 3.99 06/08/21 1.14 06/03/20 1.19 06/03/19 1.08 05/29/18 0.78   No prior history of elevated PSA.  No history of UTIs or prostatitis. He does have a family history of prostate cancer with his father.  His father was treated with brachytherapy. He was previously on testosterone replacement therapy with AndroGel x 2 years.  He reports discontinuing this approximately 1 month ago. He does have lower urinary symptoms including frequency, urgency, postvoid dribbling, and nocturia x 1.  No dysuria or gross hematuria. IPSS = 11 today.  Past Medical History:  Past Medical History:  Diagnosis Date   ALLERGIC RHINITIS CAUSE UNSPECIFIED 02/24/2009   Allergy    ASTHMA 02/05/2007   BENIGN PROSTATIC HYPERTROPHY, WITH URINARY OBSTRUCTION 05/22/2007   Dermatophytosis of foot 11/02/2009   GANGLION CYST, WRIST, LEFT 03/25/2009   Hemangioma of skin and subcutaneous tissue 06/30/2009   HEPATITIS B, HX OF 02/05/2007   per pt no history   HYPERLIPIDEMIA, BORDERLINE 04/21/2008   PLANTAR WART, RIGHT 01/23/2009   Sleep apnea    uses c-pap     Past Surgical History:  Past Surgical History:  Procedure Laterality Date   eyelid surgery  04/11/2018   Bil   WISDOM TOOTH EXTRACTION      Allergies:  Allergies  Allergen Reactions   Amlodipine     cough   Lisinopril     cough   Sudafed [Pseudoephedrine Hcl] Other (See Comments)    prostatism    Family History:  Family History  Problem Relation Age of Onset   Hypertension Mother    Arthritis Mother    Prostate cancer Father    Healthy Sister    Colon cancer Neg Hx    Esophageal cancer Neg Hx    Stomach cancer Neg Hx    Rectal cancer Neg Hx     Social History:  Social History   Tobacco Use   Smoking status: Former    Packs/day: 0.30    Types: Cigarettes    Quit date: 10/2017    Years since quitting: 4.7   Smokeless tobacco: Never  Vaping Use   Vaping Use: Never used  Substance Use Topics   Alcohol use: Yes    Alcohol/week: 7.0 standard drinks of alcohol    Types: 7 Standard drinks or equivalent per week   Drug use: No    Review of symptoms:  Constitutional:  Negative for unexplained weight loss, night sweats, fever, chills ENT:  Negative for nose bleeds, sinus pain, painful swallowing CV:  Negative for chest pain, shortness of breath, exercise intolerance, palpitations, loss of consciousness Resp:  Negative for cough, wheezing, shortness  of breath GI:  Negative for nausea, vomiting, diarrhea, bloody stools GU:  Positives noted in HPI; otherwise negative for gross hematuria, dysuria, urinary incontinence Neuro:  Negative for seizures, poor balance, limb weakness, slurred speech Psych:  Negative for lack of energy, depression, anxiety Endocrine:  Negative for polydipsia, polyuria, symptoms of hypoglycemia (dizziness, hunger, sweating) Hematologic:  Negative for anemia, purpura, petechia, prolonged or excessive bleeding, use of anticoagulants  Allergic:  Negative for difficulty breathing or choking as a result of exposure to anything; no shellfish  allergy; no allergic response (rash/itch) to materials, foods  Physical exam: BP (!) 152/96   Pulse 96   Ht '5\' 8"'$  (1.727 m)   Wt 161 lb (73 kg)   BMI 24.48 kg/m  GENERAL APPEARANCE:  Well appearing, well developed, well nourished, NAD HEENT: Atraumatic, Normocephalic, oropharynx clear. NECK: Supple without lymphadenopathy or thyromegaly. LUNGS: Clear to auscultation bilaterally. HEART: Regular Rate and Rhythm without murmurs, gallops, or rubs. ABDOMEN: Soft, non-tender, No Masses. EXTREMITIES: Moves all extremities well.  Without clubbing, cyanosis, or edema. NEUROLOGIC:  Alert and oriented x 3, normal gait, CN II-XII grossly intact.  MENTAL STATUS:  Appropriate. BACK:  Non-tender to palpation.  No CVAT SKIN:  Warm, dry and intact.   GU: Penis:  circumcised Meatus: Normal Scrotum: normal, no masses Testis: normal without masses bilateral Epididymis: normal Prostate: 30 g, NT, no nodules Rectum: Normal tone,  no masses or tenderness   Results: Results for orders placed or performed in visit on 07/04/22 (from the past 24 hour(s))  Urinalysis     Status: Abnormal   Collection Time: 07/04/22 12:00 AM  Result Value Ref Range   Glucose, UA negative negative mg/dL   Bilirubin, UA small (A) negative   Ketones, POC UA trace (5) (A) negative mg/dL   Spec Grav, UA >=1.030 (A) 1.010 - 1.025   Blood, UA negative negative   pH, UA 6.0 5.0 - 8.0   Protein Ur, POC =100 (A) negative mg/dL   Urobilinogen, UA 0.2 0.2 or 1.0 E.U./dL   Nitrite, UA Negative Negative   Leukocytes, UA Negative Negative     PVR:  27 ml

## 2022-07-04 NOTE — Telephone Encounter (Signed)
Per CoverMyMeds [place] was denied  No reason given on portal

## 2022-07-04 NOTE — Patient Instructions (Signed)
Prostate Biopsy Instructions  Stop all aspirin or blood thinners (aspirin, plavix, coumadin, warfarin, motrin, ibuprofen, advil, aleve, naproxen, naprosyn) for 7 days prior to the procedure.  If you have any questions about stopping these medications, please contact your primary care physician or cardiologist.  Having a light meal prior to the procedure is recommended.  If you are diabetic or have low blood sugar please bring a small snack or glucose tablet.  A Fleets enema is needed and can be purchased over the counter at a local pharmacy. This will need to be administered 2 hours prior to your procedure. Antibiotics will be administered in the clinic at the time of the procedure unless otherwise specified.    If you have any questions or concerns, please feel free to call the office at [phone] or send a Mychart message.   Thank you,  Staff at [facility]  

## 2022-07-05 ENCOUNTER — Ambulatory Visit (INDEPENDENT_AMBULATORY_CARE_PROVIDER_SITE_OTHER): Payer: BC Managed Care – PPO | Admitting: Rehabilitative and Restorative Service Providers"

## 2022-07-05 ENCOUNTER — Encounter: Payer: Self-pay | Admitting: Rehabilitative and Restorative Service Providers"

## 2022-07-05 ENCOUNTER — Other Ambulatory Visit: Payer: Self-pay

## 2022-07-05 DIAGNOSIS — M25512 Pain in left shoulder: Secondary | ICD-10-CM | POA: Diagnosis not present

## 2022-07-05 DIAGNOSIS — M25562 Pain in left knee: Secondary | ICD-10-CM

## 2022-07-05 DIAGNOSIS — M6281 Muscle weakness (generalized): Secondary | ICD-10-CM

## 2022-07-05 DIAGNOSIS — G8929 Other chronic pain: Secondary | ICD-10-CM | POA: Diagnosis not present

## 2022-07-07 ENCOUNTER — Encounter: Payer: Self-pay | Admitting: Urology

## 2022-07-10 ENCOUNTER — Other Ambulatory Visit: Payer: Self-pay | Admitting: Internal Medicine

## 2022-07-10 DIAGNOSIS — F418 Other specified anxiety disorders: Secondary | ICD-10-CM

## 2022-07-12 ENCOUNTER — Ambulatory Visit (INDEPENDENT_AMBULATORY_CARE_PROVIDER_SITE_OTHER): Payer: BC Managed Care – PPO | Admitting: Rehabilitative and Restorative Service Providers"

## 2022-07-12 ENCOUNTER — Encounter: Payer: Self-pay | Admitting: Rehabilitative and Restorative Service Providers"

## 2022-07-12 DIAGNOSIS — M6281 Muscle weakness (generalized): Secondary | ICD-10-CM

## 2022-07-12 DIAGNOSIS — M25512 Pain in left shoulder: Secondary | ICD-10-CM | POA: Diagnosis not present

## 2022-07-12 DIAGNOSIS — M25612 Stiffness of left shoulder, not elsewhere classified: Secondary | ICD-10-CM | POA: Diagnosis not present

## 2022-07-12 DIAGNOSIS — M25562 Pain in left knee: Secondary | ICD-10-CM

## 2022-07-12 DIAGNOSIS — G8929 Other chronic pain: Secondary | ICD-10-CM

## 2022-07-12 NOTE — Therapy (Signed)
OUTPATIENT PHYSICAL THERAPY TREATMENT   Patient Name: Caleb Obrien MRN: [id number] DOB:1966/04/02, 57 y.o., male Today's Date: 07/12/2022  END OF SESSION:  PT End of Session - 07/12/22 0804     Visit Number 2    Number of Visits 20    Date for PT Re-Evaluation 09/13/22    Authorization Type BCBS 50% coinsurance 30 visit    PT Start Time 0800    PT Stop Time 0840    PT Time Calculation (min) 40 min    Activity Tolerance Patient limited by pain    Behavior During Therapy [facility] for tasks assessed/performed              Past Medical History:  Diagnosis Date   ALLERGIC RHINITIS CAUSE UNSPECIFIED 02/24/2009   Allergy    ASTHMA 02/05/2007   BENIGN PROSTATIC HYPERTROPHY, WITH URINARY OBSTRUCTION 05/22/2007   Dermatophytosis of foot 11/02/2009   GANGLION CYST, WRIST, LEFT 03/25/2009   Hemangioma of skin and subcutaneous tissue 06/30/2009   HEPATITIS B, HX OF 02/05/2007   per pt no history   HYPERLIPIDEMIA, BORDERLINE 04/21/2008   PLANTAR WART, RIGHT 01/23/2009   Sleep apnea    uses c-pap   Past Surgical History:  Procedure Laterality Date   eyelid surgery  04/11/2018   Bil   WISDOM TOOTH EXTRACTION     Patient Active Problem List   Diagnosis Date Noted   Nontraumatic rupture of left proximal biceps tendon 06/28/2022   Elevated PSA 06/10/2022   Raynaud's phenomenon without gangrene 06/09/2022   Recurrent left knee instability 06/09/2022   Atherosclerosis of aorta ([place]) 03/29/2022   Panlobular emphysema ([place]) 03/29/2022   Encounter for general adult medical examination with abnormal findings 06/03/2020   Hypogonadism in male 06/03/2020   Vitamin D insufficiency 12/27/2017   Depression with anxiety 08/22/2017   Vitamin B12 deficiency neuropathy ([place]) 09/08/2016   GERD with apnea 04/26/2016   High risk homosexual behavior 04/12/2016   Hyperlipidemia with target LDL less than 130 04/28/2015   Drug-induced erectile dysfunction 04/27/2015   Essential hypertension,  benign 02/08/2014   Mild persistent asthma 02/08/2014   OSA (obstructive sleep apnea) 07/12/2013   GAD (generalized anxiety disorder) 02/05/2007    PCP: Janith Lima. MD  REFERRING PROVIDER: Gregor Hams, MD  REFERRING DIAG: [phone] (ICD-10-CM) - Chronic pain of left knee M25.512,G89.29 (ICD-10-CM) - Chronic left shoulder pain  THERAPY DIAG:  Chronic pain of left knee  Chronic left shoulder pain  Muscle weakness (generalized)  Stiffness of left shoulder, not elsewhere classified  Rationale for Evaluation and Treatment: Rehabilitation  ONSET DATE: 05/2022  SUBJECTIVE:   SUBJECTIVE STATEMENT: He indicated similar complaints in knee when it occurs.  He mentioned being really cautious to avoid those situations when he can.  He didn't mention any specific pain complaints in knee with exercises.   PERTINENT HISTORY: Diagnostic Limited MSK Ultrasound of: Left shoulder biceps tendon: Biceps tendon in bicipital groove is abnormal appearing Proximally is either thin or absent and more distally is thick and larger than would be normally expected and appears to be coiled Impression: Concern for proximal biceps long head tendon tear PAIN:  NPRS scale: at worst 7/10 knee,  Pain location: Lt knee, Lt shoulder Pain description: quick pain Lt knee , Lt shoulder Aggravating factors: Knee:  stepping up, moving the wrong way, shoulder Relieving factors: injection helped Lt knee short term  PRECAUTIONS: None  WEIGHT BEARING RESTRICTIONS: No  FALLS:  Has patient fallen in last 6 months?  No  LIVING ENVIRONMENT: Lives in: House/apartment Stairs: 3 stairs to enter   OCCUPATION: Desk job  PLOF: Independent, Lt hand dominant  PATIENT GOALS: Improve pain complaints, get exercises for home   OBJECTIVE:   PATIENT SURVEYS:  07/05/2022 FOTO intake: 65   predicted:  72  COGNITION: 07/05/2022 Overall cognitive status: WFL    SENSATION: 07/05/2022 No specific testing.    MUSCLE LENGTH: 07/05/2022 No specific testing  POSTURE:  07/05/2022 unremarkable  in sitting/standing  PALPATION: 07/12/2022:  Tenderness c trigger points noted in Lt glute med/min, lateral quad throughout thigh, IT band middle and lower 1/3s.   07/05/2022 Mild tenderness in distal lateral Lt quad, IT band around femoral epicondyle laterally.  Trigger points in Lt infraspinatus  UPPER EXTREMITY ROM:   ROM Right 07/05/2022 Left 07/05/2022  Shoulder flexion [facility]  Shoulder extension    Shoulder abduction [facility]  Shoulder adduction    Shoulder extension    Shoulder internal rotation    Shoulder external rotation    Elbow flexion    Elbow extension    Wrist flexion    Wrist extension    Wrist ulnar deviation    Wrist radial deviation    Wrist pronation    Wrist supination     (Blank rows = not tested) UPPER EXTREMITY MMT:  MMT Right 07/05/2022 Left 07/05/2022  Shoulder flexion 5/5 5/5  Shoulder extension    Shoulder abduction 5/5 5/5  Shoulder adduction    Shoulder extension    Shoulder internal rotation 5/5 5/5  Shoulder external rotation 5/5 4/5  Middle trapezius    Lower trapezius    Elbow flexion    Elbow extension    Wrist flexion    Wrist extension    Wrist ulnar deviation    Wrist radial deviation    Wrist pronation    Wrist supination    Grip strength     (Blank rows = not tested)   LOWER EXTREMITY ROM:   ROM Right 07/05/2022 Left 07/05/2022  Hip flexion    Hip extension    Hip abduction    Hip adduction    Hip internal rotation    Hip external rotation    Knee flexion  WFL  Knee extension  WFL  Ankle dorsiflexion    Ankle plantarflexion    Ankle inversion    Ankle eversion     (Blank rows = not tested)  LOWER EXTREMITY MMT:  MMT Right 07/05/2022 Left 07/05/2022  Hip flexion 5/5 5/5  Hip extension    Hip abduction 5/5 4/5  Hip adduction    Hip internal rotation    Hip external rotation    Knee flexion 5/5 5/5  Knee extension  5/5 55.5, 55.7 lbs 5/5 41.8, 47 lbs  Ankle dorsiflexion 5/5 5/5  Ankle plantarflexion    Ankle inversion    Ankle eversion     (Blank rows = not tested)  LOWER EXTREMITY SPECIAL TESTS:  07/05/2022 (-) Lt leg varus  FUNCTIONAL TESTS:  07/05/2022 18 inch chair transfer: able to perform s UE assist.  Several instances of attempts produced Lt knee pain laterally.  Lt SLS: 30 seconds c increased aberrant movements compared to Rt Rt SLS: 30 seconds  GAIT: 07/05/2022 Independent ambulation   TODAY'S TREATMENT  DATE:07/12/2022 Therex: Recumbent bike Lvl 2 5 mins Leg press double leg 75 lbs x 15, single leg 56 lbs x 15 Leg extension machine lowering slowly with Lt tried x 1 (stopped due to pain)  Pt. Education given regarding options at home with percussive device, tennis ball, etc for soft tissue mobilizations.   Manual STM c percussive device to Lt glute med/min, lateral quad throughout thigh.    TODAY'S TREATMENT                                                                  DATE:07/05/2022 Therex:    HEP instruction/performance c cues for techniques, handout provided.  Trial set performed of each for comprehension and symptom assessment.  See below for exercise list  PATIENT EDUCATION:  07/05/2022 Education details: HEP, POC Person educated: Patient Education method: Explanation, Demonstration, Verbal cues, and Handouts Education comprehension: verbalized understanding, returned demonstration, and verbal cues required  HOME EXERCISE PROGRAM: Access Code: [email or web address] URL: [email or web address] Date: 07/05/2022 Prepared by: Scot Jun  Exercises - Seated Quad Set (Mirrored)  - 3-5 x daily - 7 x weekly - 1 sets - 10 reps - 5 hold - Seated Straight Leg Heel Taps  - 2 x daily - 7 x weekly - 2-3 sets - 10-15 reps - Shoulder External Rotation with Anchored Resistance  - 2 x daily - 7 x weekly - 2-3  sets - 10-15 reps - Sit to Stand  - 3 x daily - 7 x weekly - 1 sets - 10 reps - Standing Hip Hiking  - 1-2 x daily - 7 x weekly - 1 sets - 10 reps - 5 hold  ASSESSMENT:  CLINICAL IMPRESSION: Concordant symptom was noted in attempt for quad strengthening in extension LAQ machine and some mild symptoms in leg press movement in deeper knee flexion.  Soft tissue palpation today showed numerous trigger points and tenderness in glute med/min, lateral aspect of quad throughout thigh and along IT band.  Symptoms seem to be connected to chief complaint.  Spent time today with education on soft tissue mobilization options at home (massage gun, tennis ball pressure, foam roller) as well as manual intervention.  Will reassess response and proceed accordingly.   OBJECTIVE IMPAIRMENTS: decreased balance, decreased coordination, decreased endurance, difficulty walking, decreased strength, increased fascial restrictions, impaired perceived functional ability, increased muscle spasms, impaired flexibility, impaired UE functional use, improper body mechanics, and pain.   ACTIVITY LIMITATIONS: carrying, lifting, bending, sitting, squatting, stairs, transfers, reach over head, and locomotion level  PARTICIPATION LIMITATIONS: interpersonal relationship, community activity, and yard work  PERSONAL FACTORS:  no specific factors  are also affecting patient's functional outcome.   REHAB POTENTIAL: Good  CLINICAL DECISION MAKING: Stable/uncomplicated  EVALUATION COMPLEXITY: Low   GOALS: Goals reviewed with patient? Yes  SHORT TERM GOALS: (target date for Short term goals are 3 weeks 07/26/2022)   1.  Patient will demonstrate independent use of home exercise program to maintain progress from in clinic treatments.  Goal status: on going 07/12/2022  LONG TERM GOALS: (target dates for all long term goals are 10 weeks  09/13/2022 )   1. Patient will demonstrate/report pain at worst less than or equal to 2/10 to  facilitate minimal limitation in daily activity secondary to pain  symptoms.  Goal status: New   2. Patient will demonstrate independent use of home exercise program to facilitate ability to maintain/progress functional gains from skilled physical therapy services.  Goal status: New   3. Patient will demonstrate FOTO outcome > or = 72 % to indicate reduced disability due to condition.  Goal status: New   4.  Patient will demonstrate Lt LE MMT 5/5 throughout to faciltiate usual transfers, stairs, squatting at [facility] for daily life.   Goal status: New   5.  Patient will demonstrate LT UE MMT 5/5 throughout to facilitate usual reaching, lifting, carrying at PLOF.  Goal status: New   6.  Patient will demonstrate transfers 18 inch chair, stairs reciprocally without UE assist to facilitate usual daily activity at PLOF.  Goal status: New    PLAN:  PT FREQUENCY: 1-2x/week  (Pt indicated more than 1x week was tough with schedule)  PT DURATION: 10 weeks  PLANNED INTERVENTIONS: Therapeutic exercises, Therapeutic activity, Neuro Muscular re-education, Balance training, Gait training, Patient/Family education, Joint mobilization, Stair training, DME instructions, Dry Needling, Electrical stimulation, Traction, Cryotherapy, vasopneumatic deviceMoist heat, Taping, Ultrasound, Ionotophoresis '4mg'$ /ml Dexamethasone, and Manual therapy.  All included unless contraindicated  PLAN FOR NEXT SESSION: Check on soft tissue trigger points/symptoms response to manual treatment.  Recheck strength.    Scot Jun, PT, DPT, OCS, ATC 07/12/22  8:45 AM

## 2022-07-19 ENCOUNTER — Encounter: Payer: BC Managed Care – PPO | Admitting: Rehabilitative and Restorative Service Providers"

## 2022-07-26 ENCOUNTER — Encounter: Payer: BC Managed Care – PPO | Admitting: Rehabilitative and Restorative Service Providers"

## 2022-08-02 ENCOUNTER — Ambulatory Visit (INDEPENDENT_AMBULATORY_CARE_PROVIDER_SITE_OTHER): Payer: BC Managed Care – PPO | Admitting: Rehabilitative and Restorative Service Providers"

## 2022-08-02 ENCOUNTER — Encounter: Payer: Self-pay | Admitting: Rehabilitative and Restorative Service Providers"

## 2022-08-02 DIAGNOSIS — M25612 Stiffness of left shoulder, not elsewhere classified: Secondary | ICD-10-CM

## 2022-08-02 DIAGNOSIS — M25562 Pain in left knee: Secondary | ICD-10-CM

## 2022-08-02 DIAGNOSIS — G8929 Other chronic pain: Secondary | ICD-10-CM

## 2022-08-02 DIAGNOSIS — M25512 Pain in left shoulder: Secondary | ICD-10-CM | POA: Diagnosis not present

## 2022-08-02 DIAGNOSIS — M6281 Muscle weakness (generalized): Secondary | ICD-10-CM

## 2022-08-02 NOTE — Therapy (Addendum)
OUTPATIENT PHYSICAL THERAPY TREATMENT /DISCHARGE   Patient Name: Caleb Obrien MRN: [id number] DOB:07/06/1965, 57 y.o., male Today's Date: 08/02/2022  END OF SESSION:  PT End of Session - 08/02/22 0756     Visit Number 3    Number of Visits 20    Date for PT Re-Evaluation 09/13/22    Authorization Type [employer] 50% coinsurance 30 visit    PT Start Time 0800    PT Stop Time 0838    PT Time Calculation (min) 38 min    Activity Tolerance Patient tolerated treatment well    Behavior During Therapy [facility] for tasks assessed/performed               Past Medical History:  Diagnosis Date   ALLERGIC RHINITIS CAUSE UNSPECIFIED 02/24/2009   Allergy    ASTHMA 02/05/2007   BENIGN PROSTATIC HYPERTROPHY, WITH URINARY OBSTRUCTION 05/22/2007   Dermatophytosis of foot 11/02/2009   GANGLION CYST, WRIST, LEFT 03/25/2009   Hemangioma of skin and subcutaneous tissue 06/30/2009   HEPATITIS B, HX OF 02/05/2007   per pt no history   HYPERLIPIDEMIA, BORDERLINE 04/21/2008   PLANTAR WART, RIGHT 01/23/2009   Sleep apnea    uses c-pap   Past Surgical History:  Procedure Laterality Date   eyelid surgery  04/11/2018   Bil   WISDOM TOOTH EXTRACTION     Patient Active Problem List   Diagnosis Date Noted   Nontraumatic rupture of left proximal biceps tendon 06/28/2022   Elevated PSA 06/10/2022   Raynaud's phenomenon without gangrene 06/09/2022   Recurrent left knee instability 06/09/2022   Atherosclerosis of aorta ([place]) 03/29/2022   Panlobular emphysema ([place]) 03/29/2022   Encounter for general adult medical examination with abnormal findings 06/03/2020   Hypogonadism in male 06/03/2020   Vitamin D insufficiency 12/27/2017   Depression with anxiety 08/22/2017   Vitamin B12 deficiency neuropathy ([place]) 09/08/2016   GERD with apnea 04/26/2016   High risk homosexual behavior 04/12/2016   Hyperlipidemia with target LDL less than 130 04/28/2015   Drug-induced erectile dysfunction 04/27/2015    Essential hypertension, benign 02/08/2014   Mild persistent asthma 02/08/2014   OSA (obstructive sleep apnea) 07/12/2013   GAD (generalized anxiety disorder) 02/05/2007    PCP: Janith Lima. MD  REFERRING PROVIDER: Gregor Hams, MD  REFERRING DIAG: [phone] (ICD-10-CM) - Chronic pain of left knee M25.512,G89.29 (ICD-10-CM) - Chronic left shoulder pain  THERAPY DIAG:  Chronic pain of left knee  Chronic left shoulder pain  Muscle weakness (generalized)  Stiffness of left shoulder, not elsewhere classified  Rationale for Evaluation and Treatment: Rehabilitation  ONSET DATE: 05/2022  SUBJECTIVE:   SUBJECTIVE STATEMENT: He indicated feeling about the same overall or a little better  He mentioned he tried leg press at gym and couldn't seem to get it down like in clinic without pain in knee.  Massage gun has helped.     He mentioned sleeping on Lt arm can still cause symptoms. He mentioned no complaints while doing normal daily life.   He mentioned that he would have to hold of treatment at this time due to other medical plans for unrelated conditions.   PERTINENT HISTORY: Diagnostic Limited MSK Ultrasound of: Left shoulder biceps tendon: Biceps tendon in bicipital groove is abnormal appearing Proximally is either thin or absent and more distally is thick and larger than would be normally expected and appears to be coiled Impression: Concern for proximal biceps long head tendon tear PAIN:  NPRS scale:at worst in knee 7/10 ,  occurring 3-4 x a week.  Pain location: Lt knee, Lt shoulder Pain description: quick pain Lt knee , Lt shoulder Aggravating factors: Knee:  stepping up, moving the wrong way, shoulder Relieving factors: injection helped Lt knee short term  PRECAUTIONS: None  WEIGHT BEARING RESTRICTIONS: No  FALLS:  Has patient fallen in last 6 months? No  LIVING ENVIRONMENT: Lives in: House/apartment Stairs: 3 stairs to enter  OCCUPATION: Desk  job  PLOF: Independent, Lt hand dominant  PATIENT GOALS: Improve pain complaints, get exercises for home   OBJECTIVE:   PATIENT SURVEYS:  08/02/2022 FOTO 60  07/05/2022 FOTO intake: 65   predicted:  72  COGNITION: 07/05/2022 Overall cognitive status: WFL    SENSATION: 07/05/2022 No specific testing.   MUSCLE LENGTH: 07/05/2022 No specific testing  POSTURE:  07/05/2022 unremarkable  in sitting/standing  PALPATION: 07/12/2022:  Tenderness c trigger points noted in Lt glute med/min, lateral quad throughout thigh, IT band middle and lower 1/3s.   07/05/2022 Mild tenderness in distal lateral Lt quad, IT band around femoral epicondyle laterally.  Trigger points in Lt infraspinatus  UPPER EXTREMITY ROM:   ROM Right 07/05/2022 Left 07/05/2022  Shoulder flexion [facility]  Shoulder extension    Shoulder abduction [facility]  Shoulder adduction    Shoulder extension    Shoulder internal rotation    Shoulder external rotation    Elbow flexion    Elbow extension    Wrist flexion    Wrist extension    Wrist ulnar deviation    Wrist radial deviation    Wrist pronation    Wrist supination     (Blank rows = not tested) UPPER EXTREMITY MMT:  MMT Right 07/05/2022 Left 07/05/2022 Left 08/02/2022  Shoulder flexion 5/5 5/5   Shoulder extension     Shoulder abduction 5/5 5/5   Shoulder adduction     Shoulder extension     Shoulder internal rotation 5/5 5/5   Shoulder external rotation 5/5 4/5 5/5 c strain  Middle trapezius     Lower trapezius     Elbow flexion     Elbow extension     Wrist flexion     Wrist extension     Wrist ulnar deviation     Wrist radial deviation     Wrist pronation     Wrist supination     Grip strength      (Blank rows = not tested)   LOWER EXTREMITY ROM:   ROM Right 07/05/2022 Left 07/05/2022  Hip flexion    Hip extension    Hip abduction    Hip adduction    Hip internal rotation    Hip external rotation    Knee flexion  WFL  Knee  extension  [facility]  Ankle dorsiflexion    Ankle plantarflexion    Ankle inversion    Ankle eversion     (Blank rows = not tested)  LOWER EXTREMITY MMT:  MMT Right 07/05/2022 Left 07/05/2022 Right 08/02/2022 Left 08/02/2022  Hip flexion 5/5 5/5    Hip extension      Hip abduction 5/5 4/5  5/5  Hip adduction      Hip internal rotation      Hip external rotation      Knee flexion 5/5 5/5    Knee extension 5/5 55.5, 55.7 lbs 5/5 41.8, 47 lbs 5/5  73, 76 lbs 5/5 65.7, 59 lbs  Ankle dorsiflexion 5/5 5/5    Ankle plantarflexion      Ankle  inversion      Ankle eversion       (Blank rows = not tested)  LOWER EXTREMITY SPECIAL TESTS:  07/05/2022 (-) Lt leg varus  FUNCTIONAL TESTS:  07/05/2022 18 inch chair transfer: able to perform s UE assist.  Several instances of attempts produced Lt knee pain laterally.  Lt SLS: 30 seconds c increased aberrant movements compared to Rt Rt SLS: 30 seconds  GAIT: 07/05/2022 Independent ambulation   TODAY'S TREATMENT                                                                  DATE:08/02/2022 Therex: Recumbent bike Lvl 2 5 mins Leg press double leg 100lbs x 15, single leg 56 lbs x 15  (review of positioning for gym based use to avoid pain symptoms including foot placement and seat adjustments)  Lateral step down 4 inch x 10 Fwd step up 4 inch x 10  Extended time in review of HEP plan.  Verbal and visual cues given to encourage routine performance of HEP and technique improvements to avoid pain increase.   TODAY'S TREATMENT                                                                  DATE:07/12/2022 Therex: Recumbent bike Lvl 2 5 mins Leg press double leg 75 lbs x 15, single leg 56 lbs x 15 Leg extension machine lowering slowly with Lt tried x 1 (stopped due to pain)  Pt. Education given regarding options at home with percussive device, tennis ball, etc for soft tissue mobilizations.   Manual STM c percussive device to Lt glute  med/min, lateral quad throughout thigh.    TODAY'S TREATMENT                                                                  DATE:07/05/2022 Therex:    HEP instruction/performance c cues for techniques, handout provided.  Trial set performed of each for comprehension and symptom assessment.  See below for exercise list  PATIENT EDUCATION:  07/05/2022 Education details: HEP, POC Person educated: Patient Education method: Explanation, Demonstration, Verbal cues, and Handouts Education comprehension: verbalized understanding, returned demonstration, and verbal cues required  HOME EXERCISE PROGRAM: Access Code: [email or web address] URL: [email or web address] Date: 08/02/2022 Prepared by: Scot Jun  Exercises - Seated Quad Set (Mirrored)  - 3-5 x daily - 7 x weekly - 1 sets - 10 reps - 5 hold - Seated Straight Leg Heel Taps  - 2 x daily - 7 x weekly - 2-3 sets - 10-15 reps - Shoulder External Rotation with Anchored Resistance  - 2 x daily - 7 x weekly - 2-3 sets - 10-15 reps - Sit to Stand  - 3 x daily - 7 x weekly - 1 sets - 10 reps - Standing Hip  Hiking  - 1-2 x daily - 7 x weekly - 1 sets - 10 reps - 5 hold - Step Up  - 1 x daily - 7 x weekly - 2-3 sets - 10 reps - Lateral Step Down  - 1 x daily - 7 x weekly - 2-3 sets - 10 reps  ASSESSMENT:  CLINICAL IMPRESSION: Pt has attended 3 visits overall at this time.  Pt indicated some mild improvements in symptoms as noted above in subjective data.  See objective data for updated information showing improvements in strength.  Time spent reviewing HEP and gym based activity to improve performance/symptoms with movements.  Pt requested HEP review due to other medical items.   OBJECTIVE IMPAIRMENTS: decreased balance, decreased coordination, decreased endurance, difficulty walking, decreased strength, increased fascial restrictions, impaired perceived functional ability, increased muscle spasms, impaired flexibility, impaired UE  functional use, improper body mechanics, and pain.   ACTIVITY LIMITATIONS: carrying, lifting, bending, sitting, squatting, stairs, transfers, reach over head, and locomotion level  PARTICIPATION LIMITATIONS: interpersonal relationship, community activity, and yard work  PERSONAL FACTORS:  no specific factors  are also affecting patient's functional outcome.   REHAB POTENTIAL: Good  CLINICAL DECISION MAKING: Stable/uncomplicated  EVALUATION COMPLEXITY: Low   GOALS: Goals reviewed with patient? Yes  SHORT TERM GOALS: (target date for Short term goals are 3 weeks 07/26/2022)   1.  Patient will demonstrate independent use of home exercise program to maintain progress from in clinic treatments.  Goal status: Met  LONG TERM GOALS: (target dates for all long term goals are 10 weeks  09/13/2022 )   1. Patient will demonstrate/report pain at worst less than or equal to 2/10 to facilitate minimal limitation in daily activity secondary to pain symptoms.  Goal status: on going 08/02/2022   2. Patient will demonstrate independent use of home exercise program to facilitate ability to maintain/progress functional gains from skilled physical therapy services.  Goal status: on going 08/02/2022   3. Patient will demonstrate FOTO outcome > or = 72 % to indicate reduced disability due to condition.  Goal status: on going 08/02/2022   4.  Patient will demonstrate Lt LE MMT 5/5 throughout to faciltiate usual transfers, stairs, squatting at [facility] for daily life.   Goal status: Met  08/02/2022   5.  Patient will demonstrate LT UE MMT 5/5 throughout to facilitate usual reaching, lifting, carrying at PLOF.  Goal status: Met 08/02/2022   6.  Patient will demonstrate transfers 18 inch chair, stairs reciprocally without UE assist to facilitate usual daily activity at PLOF.  Goal status: on going 08/02/2022    PLAN:  PT FREQUENCY: 1-2x/week  (Pt indicated more than 1x week was tough with schedule)  PT  DURATION: 10 weeks  PLANNED INTERVENTIONS: Therapeutic exercises, Therapeutic activity, Neuro Muscular re-education, Balance training, Gait training, Patient/Family education, Joint mobilization, Stair training, DME instructions, Dry Needling, Electrical stimulation, Traction, Cryotherapy, vasopneumatic deviceMoist heat, Taping, Ultrasound, Ionotophoresis 4mg /ml Dexamethasone, and Manual therapy.  All included unless contraindicated  PLAN FOR NEXT SESSION: Hold PT for other medical things.  Review presentation upon any return.    Scot Jun, PT, DPT, OCS, ATC 08/02/22  8:39 AM  PHYSICAL THERAPY DISCHARGE SUMMARY  Visits from Start of Care: 3  Current functional level related to goals / functional outcomes: See note   Remaining deficits: See note   Education / Equipment: HEP  Patient goals were partially met. Patient is being discharged due to  due to other medical complications and Pt.  request.   Scot Jun, PT, DPT, OCS, ATC 09/01/22  9:05 AM

## 2022-08-09 ENCOUNTER — Encounter: Payer: BC Managed Care – PPO | Admitting: Rehabilitative and Restorative Service Providers"

## 2022-08-11 ENCOUNTER — Ambulatory Visit: Payer: BC Managed Care – PPO | Admitting: Family Medicine

## 2022-08-16 ENCOUNTER — Ambulatory Visit (HOSPITAL_BASED_OUTPATIENT_CLINIC_OR_DEPARTMENT_OTHER)
Admission: RE | Admit: 2022-08-16 | Discharge: 2022-08-16 | Disposition: A | Discharge status: Home / Self Care | Payer: BC Managed Care – PPO | Source: Ambulatory Visit | Attending: Urology | Admitting: Urology

## 2022-08-16 ENCOUNTER — Encounter: Payer: Self-pay | Admitting: Urology

## 2022-08-16 ENCOUNTER — Ambulatory Visit (INDEPENDENT_AMBULATORY_CARE_PROVIDER_SITE_OTHER): Payer: BC Managed Care – PPO | Admitting: Urology

## 2022-08-16 VITALS — BP 127/82 | HR 88 | Ht 68.0 in | Wt 160.0 lb

## 2022-08-16 DIAGNOSIS — N4232 Atypical small acinar proliferation of prostate: Secondary | ICD-10-CM | POA: Diagnosis not present

## 2022-08-16 DIAGNOSIS — R972 Elevated prostate specific antigen [PSA]: Secondary | ICD-10-CM | POA: Insufficient documentation

## 2022-08-16 DIAGNOSIS — Z8042 Family history of malignant neoplasm of prostate: Secondary | ICD-10-CM

## 2022-08-16 DIAGNOSIS — C61 Malignant neoplasm of prostate: Secondary | ICD-10-CM

## 2022-08-16 LAB — URINALYSIS
Bilirubin, UA: NEGATIVE
Blood, UA: NEGATIVE
Glucose, UA: NEGATIVE mg/dL
Ketones, UA: NEGATIVE
Leukocytes, UA: NEGATIVE
Nitrite, UA: NEGATIVE
Protein, UA: NEGATIVE
Spec Grav, UA: 1.01 (ref 1.010–1.025)
Urobilinogen, UA: 0.2 E.U./dL
pH, UA: 7 (ref 5.0–8.0)

## 2022-08-16 MED ORDER — CEFTRIAXONE SODIUM 1 G IJ SOLR
1.0000 g | Freq: Once | INTRAMUSCULAR | Status: AC
  Administered 2022-08-16: 1 g via INTRAMUSCULAR

## 2022-08-16 NOTE — Progress Notes (Addendum)
Assessment: 1. Elevated PSA   2. Family history of prostate cancer in father     Plan: Post biopsy instructions given. Return to office in 7-10 days for biopsy results  Chief Complaint:  Chief Complaint  Patient presents with   Prostate Biopsy    History of Present Illness:  Caleb Obrien is a 57 y.o. male who is seen for further evaluation of elevated PSA. PSA results: 06/30/22 4.7 with 6% free 06/29/22 5.65 06/09/22 3.99 06/08/21 1.14 06/03/20 1.19 06/03/19 1.08 05/29/18 0.78   No prior history of elevated PSA.  No history of UTIs or prostatitis. He does have a family history of prostate cancer with his father.  His father was treated with brachytherapy. He was previously on testosterone replacement therapy with AndroGel x 2 years.  He reports discontinuing this approximately 1 month ago. He does have lower urinary symptoms including frequency, urgency, postvoid dribbling, and nocturia x 1.  No dysuria or gross hematuria. IPSS = 11.  PVR = 27 ml.  He presents today for prostate biopsy.  Portions of the above documentation were copied from a prior visit for review purposes only.   Past Medical History:  Past Medical History:  Diagnosis Date   ALLERGIC RHINITIS CAUSE UNSPECIFIED 02/24/2009   Allergy    ASTHMA 02/05/2007   BENIGN PROSTATIC HYPERTROPHY, WITH URINARY OBSTRUCTION 05/22/2007   Dermatophytosis of foot 11/02/2009   GANGLION CYST, WRIST, LEFT 03/25/2009   Hemangioma of skin and subcutaneous tissue 06/30/2009   HEPATITIS B, HX OF 02/05/2007   per pt no history   HYPERLIPIDEMIA, BORDERLINE 04/21/2008   PLANTAR WART, RIGHT 01/23/2009   Sleep apnea    uses c-pap    Past Surgical History:  Past Surgical History:  Procedure Laterality Date   eyelid surgery  04/11/2018   Bil   WISDOM TOOTH EXTRACTION      Allergies:  Allergies  Allergen Reactions   Amlodipine     cough   Lisinopril     cough   Sudafed [Pseudoephedrine Hcl] Other (See Comments)     prostatism    Family History:  Family History  Problem Relation Age of Onset   Hypertension Mother    Arthritis Mother    Prostate cancer Father    Healthy Sister    Colon cancer Neg Hx    Esophageal cancer Neg Hx    Stomach cancer Neg Hx    Rectal cancer Neg Hx     Social History:  Social History   Tobacco Use   Smoking status: Former    Packs/day: 0.30    Types: Cigarettes    Quit date: 10/2017    Years since quitting: 4.8   Smokeless tobacco: Never  Vaping Use   Vaping Use: Never used  Substance Use Topics   Alcohol use: Yes    Alcohol/week: 7.0 standard drinks of alcohol    Types: 7 Standard drinks or equivalent per week   Drug use: No    ROS: Constitutional:  Negative for fever, chills, weight loss CV: Negative for chest pain, previous MI, hypertension Respiratory:  Negative for shortness of breath, wheezing, sleep apnea, frequent cough GI:  Negative for nausea, vomiting, bloody stool, GERD  Physical exam: BP 127/82   Pulse 88   Ht '5\' 8"'$  (1.727 m)   Wt 160 lb (72.6 kg)   BMI 24.33 kg/m  GENERAL APPEARANCE:  Well appearing, well developed, well nourished, NAD HEENT:  Atraumatic, normocephalic, oropharynx clear NECK:  Supple without lymphadenopathy or  thyromegaly ABDOMEN:  Soft, non-tender, no masses EXTREMITIES:  Moves all extremities well, without clubbing, cyanosis, or edema NEUROLOGIC:  Alert and oriented x 3, normal gait, CN II-XII grossly intact MENTAL STATUS:  appropriate BACK:  Non-tender to palpation, No CVAT SKIN:  Warm, dry, and intact   Results: Results for orders placed or performed in visit on 08/16/22 (from the past 24 hour(s))  Urinalysis     Status: None   Collection Time: 08/16/22 12:00 AM  Result Value Ref Range   Glucose, UA negative negative mg/dL   Bilirubin, UA negative    Ketones, UA negative    Spec Grav, UA 1.010 1.010 - 1.025   Blood, UA negative    pH, UA 7.0 5.0 - 8.0   Protein, UA Negative Negative    Urobilinogen, UA 0.2 0.2 or 1.0 E.U./dL   Nitrite, UA negative    Leukocytes, UA Negative Negative     TRANSRECTAL ULTRASOUND AND PROSTATE BIOPSY  Indication:  Elevated PSA  Prophylactic antibiotic administration: Rocephin  All medications that could result in increased bleeding were discontinued within an appropriate period of the time of biopsy.  Risk including bleeding and infection were discussed.  Informed consent was obtained.  The patient was placed in the left lateral decubitus position.  PROCEDURE 1.  TRANSRECTAL ULTRASOUND OF THE PROSTATE  The 7 MHz transrectal probe was used to image the prostate.  Anal stenosis was not noted.  TRUS volume: 24.4 ml  Hypoechoic areas: None  Hyperechoic areas: None  Central calcifications: present  Margins:  normal  Seminal Vesicles: normal   PROCEDURE 2:  PROSTATE BIOPSY  A periprostatic block was performed using 1% lidocaine and transrectal ultrasound guidance. Under transrectal ultrasound guidance, and using the Biopty gun, prostate biopsies were obtained systematically from the apex, mid gland, and base bilaterally.  A total of 12 cores were obtained.  Hemostasis was obtained with gentle pressure on the prostate.  The procedures were well-tolerated.  No significant bleeding was noted at the end of the procedure.  The patient was stable for discharge from the office.

## 2022-08-16 NOTE — Progress Notes (Signed)
IM Injection  Patient is present today for an IM Injection for treatment of infection prevention post prostate biopsy. Drug: Ceftriaxone Dose:1g Location:Right upper outer buttocks Lot: [id number] Exp:03/2024 Patient tolerated well, no complications were noted  Performed by: Bradly Bienenstock CMA

## 2022-08-18 ENCOUNTER — Encounter: Payer: Self-pay | Admitting: Urology

## 2022-08-22 ENCOUNTER — Other Ambulatory Visit: Payer: Self-pay | Admitting: Urology

## 2022-08-22 DIAGNOSIS — C61 Malignant neoplasm of prostate: Secondary | ICD-10-CM

## 2022-08-24 ENCOUNTER — Ambulatory Visit: Payer: BC Managed Care – PPO | Admitting: Urology

## 2022-08-24 ENCOUNTER — Encounter: Payer: Self-pay | Admitting: Urology

## 2022-08-25 ENCOUNTER — Ambulatory Visit: Payer: BC Managed Care – PPO | Admitting: Urology

## 2022-08-26 ENCOUNTER — Encounter: Payer: Self-pay | Admitting: Urology

## 2022-08-29 ENCOUNTER — Encounter: Payer: Self-pay | Admitting: Urology

## 2022-08-31 ENCOUNTER — Encounter: Payer: BC Managed Care – PPO | Admitting: Internal Medicine

## 2022-09-08 ENCOUNTER — Encounter: Payer: Self-pay | Admitting: Urology

## 2022-09-14 ENCOUNTER — Encounter: Payer: Self-pay | Admitting: Internal Medicine

## 2022-09-17 ENCOUNTER — Other Ambulatory Visit: Payer: Self-pay | Admitting: Internal Medicine

## 2022-09-17 DIAGNOSIS — Z7252 High risk homosexual behavior: Secondary | ICD-10-CM

## 2022-09-19 ENCOUNTER — Encounter (HOSPITAL_COMMUNITY)
Admission: RE | Admit: 2022-09-19 | Discharge: 2022-09-19 | Disposition: A | Discharge status: Home / Self Care | Payer: BC Managed Care – PPO | Source: Ambulatory Visit | Attending: Urology | Admitting: Urology

## 2022-09-19 DIAGNOSIS — C61 Malignant neoplasm of prostate: Secondary | ICD-10-CM | POA: Diagnosis not present

## 2022-09-19 MED ORDER — PIFLIFOLASTAT F 18 (PYLARIFY) INJECTION
9.0000 | Freq: Once | INTRAVENOUS | Status: AC
  Administered 2022-09-19: 9.73 via INTRAVENOUS

## 2022-09-20 ENCOUNTER — Encounter: Payer: Self-pay | Admitting: Urology

## 2022-09-20 ENCOUNTER — Encounter: Payer: Self-pay | Admitting: Internal Medicine

## 2022-09-20 ENCOUNTER — Ambulatory Visit: Payer: BC Managed Care – PPO | Admitting: Internal Medicine

## 2022-09-20 VITALS — BP 128/78 | HR 96 | Temp 98.3°F | Ht 68.0 in | Wt 171.0 lb

## 2022-09-20 DIAGNOSIS — I10 Essential (primary) hypertension: Secondary | ICD-10-CM | POA: Diagnosis not present

## 2022-09-20 DIAGNOSIS — Z7252 High risk homosexual behavior: Secondary | ICD-10-CM

## 2022-09-20 DIAGNOSIS — K130 Diseases of lips: Secondary | ICD-10-CM | POA: Diagnosis not present

## 2022-09-20 DIAGNOSIS — J4530 Mild persistent asthma, uncomplicated: Secondary | ICD-10-CM

## 2022-09-20 DIAGNOSIS — C61 Malignant neoplasm of prostate: Secondary | ICD-10-CM

## 2022-09-20 LAB — CBC WITH DIFFERENTIAL/PLATELET
Basophils Absolute: 0.1 10*3/uL (ref 0.0–0.1)
Basophils Relative: 0.8 % (ref 0.0–3.0)
Eosinophils Absolute: 0.1 10*3/uL (ref 0.0–0.7)
Eosinophils Relative: 1.4 % (ref 0.0–5.0)
HCT: 42.5 % (ref 39.0–52.0)
Hemoglobin: 14.7 g/dL (ref 13.0–17.0)
Lymphocytes Relative: 28.8 % (ref 12.0–46.0)
Lymphs Abs: 2.1 10*3/uL (ref 0.7–4.0)
MCHC: 34.6 g/dL (ref 30.0–36.0)
MCV: 104.7 fl — ABNORMAL HIGH (ref 78.0–100.0)
Monocytes Absolute: 1.4 10*3/uL — ABNORMAL HIGH (ref 0.1–1.0)
Monocytes Relative: 19.7 % — ABNORMAL HIGH (ref 3.0–12.0)
Neutro Abs: 3.5 10*3/uL (ref 1.4–7.7)
Neutrophils Relative %: 49.3 % (ref 43.0–77.0)
Platelets: 354 10*3/uL (ref 150.0–400.0)
RBC: 4.06 Mil/uL — ABNORMAL LOW (ref 4.22–5.81)
RDW: 12.9 % (ref 11.5–15.5)
WBC: 7.1 10*3/uL (ref 4.0–10.5)

## 2022-09-20 LAB — BASIC METABOLIC PANEL
BUN: 17 mg/dL (ref 6–23)
CO2: 30 mEq/L (ref 19–32)
Calcium: 9.4 mg/dL (ref 8.4–10.5)
Chloride: 96 mEq/L (ref 96–112)
Creatinine, Ser: 1.12 mg/dL (ref 0.40–1.50)
GFR: 74.2 mL/min (ref >60.00)
Glucose, Bld: 91 mg/dL (ref 70–99)
Potassium: 3.6 mEq/L (ref 3.5–5.1)
Sodium: 134 mEq/L — ABNORMAL LOW (ref 135–145)

## 2022-09-20 MED ORDER — AIRSUPRA 90-80 MCG/ACT IN AERO: 2.0000 | INHALATION_SPRAY | Freq: Four times a day (QID) | RESPIRATORY_TRACT | 1 refills | Status: AC

## 2022-09-20 MED ORDER — TRIAMCINOLONE ACETONIDE 0.5 % EX CREA: 1.0000 | TOPICAL_CREAM | Freq: Two times a day (BID) | CUTANEOUS | 0 refills | Status: DC

## 2022-09-20 MED ORDER — INDAPAMIDE 1.25 MG PO TABS: 1.2500 mg | ORAL_TABLET | Freq: Every day | ORAL | 1 refills | Status: DC

## 2022-09-20 NOTE — Progress Notes (Signed)
Subjective:  Patient ID: Caleb Obrien, male    DOB: 1966/03/24  Age: 57 y.o. MRN: [id number]  CC: Asthma and Hypertension   HPI Caleb Obrien presents for f/up ---  He complains of a 2 week history of NP cough.  He complains of chapped lips but denies chest pain, fever, chills, night sweats, or edema.  Outpatient Medications Prior to Visit  Medication Sig Dispense Refill   ALPRAZolam (XANAX) 0.25 MG tablet TAKE 1/2 TABLET BY MOUTH DAILY AS NEEDED 30 tablet 3   aspirin EC 81 MG tablet Take 1 tablet (81 mg total) by mouth daily. Swallow whole. 90 tablet 1   atorvastatin (LIPITOR) 40 MG tablet Take 1 tablet (40 mg total) by mouth daily. 90 tablet 1   buPROPion ER (WELLBUTRIN SR) 100 MG 12 hr tablet Take 1 tablet (100 mg total) by mouth 2 (two) times daily. 180 tablet 0   carvedilol (COREG) 3.125 MG tablet Take 1 tablet (3.125 mg total) by mouth 2 (two) times daily with a meal. 180 tablet 0   cetirizine (ZYRTEC) 10 MG tablet TAKE 1 TABLET(10 MG) BY MOUTH DAILY 30 tablet 11   Cholecalciferol 2000 units TABS Take 1 tablet (2,000 Units total) by mouth daily. 90 tablet 1   dexlansoprazole (DEXILANT) 60 MG capsule TAKE 1 CAPSULE BY MOUTH EVERY DAY 90 capsule 1   emtricitabine-tenofovir (TRUVADA) 200-300 MG tablet Take 1 tablet by mouth once daily 90 tablet 0   irbesartan (AVAPRO) 300 MG tablet Take 1 tablet (300 mg total) by mouth daily. 90 tablet 0   tadalafil (CIALIS) 20 MG tablet TAKE ONE TABLET BY MOUTH DAILY AS NEEDED FOR ERECTILE DYSFUNCTION 30 tablet 1   indapamide (LOZOL) 2.5 MG tablet Take 1 tablet (2.5 mg total) by mouth daily. 90 tablet 0   Testosterone (ANDROGEL PUMP) 20.25 MG/ACT (1.62%) GEL Place 1 Act onto the skin daily. (Patient not taking: Reported on 07/04/2022) 225 g 0   No facility-administered medications prior to visit.    ROS Review of Systems  Constitutional:  Positive for unexpected weight change (wt gain). Negative for appetite change, chills, diaphoresis  and fatigue.  HENT: Negative.    Eyes: Negative.   Respiratory:  Positive for cough. Negative for chest tightness, shortness of breath and wheezing.   Cardiovascular:  Negative for chest pain, palpitations and leg swelling.  Gastrointestinal:  Negative for abdominal pain, constipation, diarrhea, nausea and vomiting.  Genitourinary: Negative.  Negative for difficulty urinating.  Musculoskeletal: Negative.  Negative for arthralgias and myalgias.  Skin: Negative.   Neurological:  Negative for dizziness, weakness, light-headedness and numbness.  Hematological:  Negative for adenopathy. Does not bruise/bleed easily.  Psychiatric/Behavioral:  The patient is nervous/anxious.     Objective:  BP 128/78 (BP Location: Left Arm, Patient Position: Sitting, Cuff Size: Large)   Pulse 96   Temp 98.3 F (36.8 C) (Oral)   Ht 5\' 8"  (1.727 m)   Wt 171 lb (77.6 kg)   SpO2 97%   BMI 26.00 kg/m   BP Readings from Last 3 Encounters:  09/21/22 (!) 141/83  09/20/22 128/78  08/16/22 127/82    Wt Readings from Last 3 Encounters:  09/21/22 165 lb (74.8 kg)  09/20/22 171 lb (77.6 kg)  08/16/22 160 lb (72.6 kg)    Physical Exam Vitals reviewed.  Constitutional:      Appearance: He is not ill-appearing.  HENT:     Nose: Nose normal.     Mouth/Throat:  Mouth: Mucous membranes are moist.  Eyes:     General: No scleral icterus.    Conjunctiva/sclera: Conjunctivae normal.  Cardiovascular:     Rate and Rhythm: Normal rate and regular rhythm.     Heart sounds: No murmur heard.    No gallop.  Pulmonary:     Effort: Pulmonary effort is normal.     Breath sounds: No stridor. No wheezing, rhonchi or rales.  Abdominal:     General: Abdomen is flat.     Palpations: There is no mass.     Tenderness: There is no abdominal tenderness. There is no guarding.     Hernia: No hernia is present.  Musculoskeletal:     Cervical back: Neck supple.  Lymphadenopathy:     Cervical: No cervical adenopathy.   Skin:    General: Skin is warm.  Neurological:     General: No focal deficit present.     Mental Status: He is alert. Mental status is at baseline.  Psychiatric:        Mood and Affect: Mood normal.        Behavior: Behavior normal.     Lab Results  Component Value Date   WBC 7.1 09/20/2022   HGB 14.7 09/20/2022   HCT 42.5 09/20/2022   PLT 354.0 09/20/2022   GLUCOSE 91 09/20/2022   CHOL 156 06/09/2022   TRIG 99.0 06/09/2022   HDL 53.30 06/09/2022   LDLDIRECT 144.4 01/27/2012   LDLCALC 83 06/09/2022   ALT 38 06/09/2022   AST 26 06/09/2022   NA 134 (L) 09/20/2022   K 3.6 09/20/2022   CL 96 09/20/2022   CREATININE 1.12 09/20/2022   BUN 17 09/20/2022   CO2 30 09/20/2022   TSH 3.41 06/09/2022   PSA 5.65 (H) 06/29/2022    No results found.  Assessment & Plan:   Essential hypertension, benign- His Obrien pressure is very well-controlled but he has developed mild hyponatremia.  Will decrease the dose of indapamide and continue the other antihypertensives. -     Indapamide; Take 1 tablet (1.25 mg total) by mouth daily.  Dispense: 90 tablet; Refill: 1 -     Basic metabolic panel; Future  Mild persistent asthma without complication -     Airsupra; Inhale 2 puffs into the lungs 4 (four) times daily.  Dispense: 32.1 g; Refill: 1 -     Basic metabolic panel; Future -     CBC with Differential/Platelet; Future  High risk homosexual behavior -     RPR; Future -     HIV Antibody (routine testing w rflx); Future -     Hepatitis B surface antigen; Future  Chapped lips -     Triamcinolone Acetonide; Apply 1 Application topically 2 (two) times daily.  Dispense: 30 g; Refill: 0  Prostate cancer     Follow-up: Return in about 6 months (around 03/22/2023).  Sanda Lingerhomas Shaleta Ruacho, MD

## 2022-09-20 NOTE — Patient Instructions (Signed)
Hypertension, Adult High blood pressure (hypertension) is when the force of blood pumping through the arteries is too strong. The arteries are the blood vessels that carry blood from the heart throughout the body. Hypertension forces the heart to work harder to pump blood and may cause arteries to become narrow or stiff. Untreated or uncontrolled hypertension can lead to a heart attack, heart failure, a stroke, kidney disease, and other problems. A blood pressure reading consists of a higher number over a lower number. Ideally, your blood pressure should be below 120/80. The first ("top") number is called the systolic pressure. It is a measure of the pressure in your arteries as your heart beats. The second ("bottom") number is called the diastolic pressure. It is a measure of the pressure in your arteries as the heart relaxes. What are the causes? The exact cause of this condition is not known. There are some conditions that result in high blood pressure. What increases the risk? Certain factors may make you more likely to develop high blood pressure. Some of these risk factors are under your control, including: Smoking. Not getting enough exercise or physical activity. Being overweight. Having too much fat, sugar, calories, or salt (sodium) in your diet. Drinking too much alcohol. Other risk factors include: Having a personal history of heart disease, diabetes, high cholesterol, or kidney disease. Stress. Having a family history of high blood pressure and high cholesterol. Having obstructive sleep apnea. Age. The risk increases with age. What are the signs or symptoms? High blood pressure may not cause symptoms. Very high blood pressure (hypertensive crisis) may cause: Headache. Fast or irregular heartbeats (palpitations). Shortness of breath. Nosebleed. Nausea and vomiting. Vision changes. Severe chest pain, dizziness, and seizures. How is this diagnosed? This condition is diagnosed by  measuring your blood pressure while you are seated, with your arm resting on a flat surface, your legs uncrossed, and your feet flat on the floor. The cuff of the blood pressure monitor will be placed directly against the skin of your upper arm at the level of your heart. Blood pressure should be measured at least twice using the same arm. Certain conditions can cause a difference in blood pressure between your right and left arms. If you have a high blood pressure reading during one visit or you have normal blood pressure with other risk factors, you may be asked to: Return on a different day to have your blood pressure checked again. Monitor your blood pressure at home for 1 week or longer. If you are diagnosed with hypertension, you may have other blood or imaging tests to help your health care provider understand your overall risk for other conditions. How is this treated? This condition is treated by making healthy lifestyle changes, such as eating healthy foods, exercising more, and reducing your alcohol intake. You may be referred for counseling on a healthy diet and physical activity. Your health care provider may prescribe medicine if lifestyle changes are not enough to get your blood pressure under control and if: Your systolic blood pressure is above 130. Your diastolic blood pressure is above 80. Your personal target blood pressure may vary depending on your medical conditions, your age, and other factors. Follow these instructions at home: Eating and drinking  Eat a diet that is high in fiber and potassium, and low in sodium, added sugar, and fat. An example of this eating plan is called the DASH diet. DASH stands for Dietary Approaches to Stop Hypertension. To eat this way: Eat   plenty of fresh fruits and vegetables. Try to fill one half of your plate at each meal with fruits and vegetables. Eat whole grains, such as whole-wheat pasta, brown rice, or whole-grain bread. Fill about one  fourth of your plate with whole grains. Eat or drink low-fat dairy products, such as skim milk or low-fat yogurt. Avoid fatty cuts of meat, processed or cured meats, and poultry with skin. Fill about one fourth of your plate with lean proteins, such as fish, chicken without skin, beans, eggs, or tofu. Avoid pre-made and processed foods. These tend to be higher in sodium, added sugar, and fat. Reduce your daily sodium intake. Many people with hypertension should eat less than 1,500 mg of sodium a day. Do not drink alcohol if: Your health care provider tells you not to drink. You are pregnant, may be pregnant, or are planning to become pregnant. If you drink alcohol: Limit how much you have to: 0-1 drink a day for women. 0-2 drinks a day for men. Know how much alcohol is in your drink. In the [place] one drink equals one 12 oz bottle of beer (355 mL), one 5 oz glass of wine (148 mL), or one 1 oz glass of hard liquor (44 mL). Lifestyle  Work with your health care provider to maintain a healthy body weight or to lose weight. Ask what an ideal weight is for you. Get at least 30 minutes of exercise that causes your heart to beat faster (aerobic exercise) most days of the week. Activities may include walking, swimming, or biking. Include exercise to strengthen your muscles (resistance exercise), such as Pilates or lifting weights, as part of your weekly exercise routine. Try to do these types of exercises for 30 minutes at least 3 days a week. Do not use any products that contain nicotine or tobacco. These products include cigarettes, chewing tobacco, and vaping devices, such as e-cigarettes. If you need help quitting, ask your health care provider. Monitor your blood pressure at home as told by your health care provider. Keep all follow-up visits. This is important. Medicines Take over-the-counter and prescription medicines only as told by your health care provider. Follow directions carefully. Blood  pressure medicines must be taken as prescribed. Do not skip doses of blood pressure medicine. Doing this puts you at risk for problems and can make the medicine less effective. Ask your health care provider about side effects or reactions to medicines that you should watch for. Contact a health care provider if you: Think you are having a reaction to a medicine you are taking. Have headaches that keep coming back (recurring). Feel dizzy. Have swelling in your ankles. Have trouble with your vision. Get help right away if you: Develop a severe headache or confusion. Have unusual weakness or numbness. Feel faint. Have severe pain in your chest or abdomen. Vomit repeatedly. Have trouble breathing. These symptoms may be an emergency. Get help right away. Call 911. Do not wait to see if the symptoms will go away. Do not drive yourself to the hospital. Summary Hypertension is when the force of blood pumping through your arteries is too strong. If this condition is not controlled, it may put you at risk for serious complications. Your personal target blood pressure may vary depending on your medical conditions, your age, and other factors. For most people, a normal blood pressure is less than 120/80. Hypertension is treated with lifestyle changes, medicines, or a combination of both. Lifestyle changes include losing weight, eating a healthy,   low-sodium diet, exercising more, and limiting alcohol. This information is not intended to replace advice given to you by your health care provider. Make sure you discuss any questions you have with your health care provider. Document Revised: 04/06/2021 Document Reviewed: 04/06/2021 Elsevier Patient Education  2023 [employer]  

## 2022-09-21 ENCOUNTER — Encounter: Payer: Self-pay | Admitting: Urology

## 2022-09-21 ENCOUNTER — Ambulatory Visit (INDEPENDENT_AMBULATORY_CARE_PROVIDER_SITE_OTHER): Payer: BC Managed Care – PPO | Admitting: Urology

## 2022-09-21 VITALS — BP 141/83 | HR 109 | Ht 67.0 in | Wt 165.0 lb

## 2022-09-21 DIAGNOSIS — C61 Malignant neoplasm of prostate: Secondary | ICD-10-CM

## 2022-09-21 DIAGNOSIS — Z8042 Family history of malignant neoplasm of prostate: Secondary | ICD-10-CM | POA: Diagnosis not present

## 2022-09-21 LAB — URINALYSIS, ROUTINE W REFLEX MICROSCOPIC
Bilirubin, UA: NEGATIVE
Glucose, UA: NEGATIVE
Ketones, UA: NEGATIVE
Nitrite, UA: NEGATIVE
Protein,UA: NEGATIVE
RBC, UA: NEGATIVE
Specific Gravity, UA: 1.015 (ref 1.005–1.030)
Urobilinogen, Ur: 0.2 mg/dL (ref 0.2–1.0)
pH, UA: 7 (ref 5.0–7.5)

## 2022-09-21 LAB — MICROSCOPIC EXAMINATION
Cast Type: NONE SEEN
Casts: NONE SEEN /lpf
Crystal Type: NONE SEEN
Crystals: NONE SEEN
Mucus, UA: NONE SEEN
RBC, Urine: NONE SEEN /hpf (ref 0–2)
Renal Epithel, UA: NONE SEEN /hpf
Trichomonas, UA: NONE SEEN
Yeast, UA: NONE SEEN

## 2022-09-21 LAB — HEPATITIS B SURFACE ANTIGEN: Hepatitis B Surface Ag: NONREACTIVE

## 2022-09-21 LAB — RPR: RPR Ser Ql: NONREACTIVE

## 2022-09-21 LAB — HIV ANTIBODY (ROUTINE TESTING W REFLEX): HIV 1&2 Ab, 4th Generation: NONREACTIVE

## 2022-09-21 NOTE — Patient Instructions (Addendum)
[email or web address]

## 2022-09-21 NOTE — Progress Notes (Signed)
Assessment: 1. Prostate cancer; PSA 4.7; GG 1,4; high risk   2. Family history of prostate cancer in father     Plan: I spent a total of 50 minutes counseling Caleb Obrien regarding the diagnosis of localized prostate cancer.  I spent the first 20 minutes discussing the biopsy results.  Using the [facility] prostate cancer nomogram, I cited a probability of 65 percent for localized prostate cancer, 33 percent of extracapsular extension, 5 percent of seminal vesicle involvement, and 7 percent of lymph node involvement.  I discussed the diagnosis of localized prostate cancer in the natural history of prostate cancer. I then spent the next 30 minutes discussing treatment options for localized prostate cancer.  Specifically, I discussed radical prostatectomy (RALP, RRP, RPP), external beam radiation, cryosurgery, and high intensity frequency ultrasound.  I discussed the risk and benefits of each treatment.  I discussed the potential risk of impotence and incontinence as they relate to treatment of prostate cancer.  Questions were answered.   I reviewed the results of the PSMA PET scan and discussed these with the patient in detail today.  Fortunately, there is no evidence of metastatic disease. Given his age and high risk prostate cancer, I recommended that he strongly consider robotic assisted laparoscopic prostatectomy for management of his localized prostate cancer. Referral to Dr. Crecencio Mc at [facility]   Chief Complaint:  Chief Complaint  Patient presents with   Results    History of Present Illness:  Caleb Obrien is a 57 y.o. male who is seen for further evaluation of high risk prostate cancer. PSA results: 06/30/22 4.7 with 6% free 06/29/22 5.65 06/09/22 3.99 06/08/21 1.14 06/03/20 1.19 06/03/19 1.08 05/29/18 0.78   No prior history of elevated PSA.  No history of UTIs or prostatitis. He does have a family history of prostate cancer with his father.  His father was  treated with brachytherapy. He was previously on testosterone replacement therapy with AndroGel x 2 years.  He reports discontinuing this approximately 1 month ago. He does have lower urinary symptoms including frequency, urgency, postvoid dribbling, and nocturia x 1.  No dysuria or gross hematuria. IPSS = 11.  PVR = 27 ml.  He underwent a transrectal ultrasound and biopsy of the prostate on 08/16/22. PSA: 4.7 ng/ml TRUS volume:  24.4 ml  PSA density:  0.19 Biopsy results:             Gleason score: 4+ 4 = 8, 3 + 3 = 6            # positive cores: 3/6 on right    0/6 on left            Location of cancer: apex and mid gland  Complications after biopsy: none Patient without significant LUTS.    Patient without erectile dysfunction.    PSMA PET scan was obtained for staging of his high risk prostate cancer.  The PET scan from 09/19/2022 showed focal activity in the right apical region of the prostate, no evidence of metastatic disease within the pelvis or abdomen.   Portions of the above documentation were copied from a prior visit for review purposes only.   Past Medical History:  Past Medical History:  Diagnosis Date   ALLERGIC RHINITIS CAUSE UNSPECIFIED 02/24/2009   Allergy    ASTHMA 02/05/2007   BENIGN PROSTATIC HYPERTROPHY, WITH URINARY OBSTRUCTION 05/22/2007   Dermatophytosis of foot 11/02/2009   GANGLION CYST, WRIST, LEFT 03/25/2009   Hemangioma of skin  and subcutaneous tissue 06/30/2009   HEPATITIS B, HX OF 02/05/2007   per pt no history   HYPERLIPIDEMIA, BORDERLINE 04/21/2008   PLANTAR WART, RIGHT 01/23/2009   Sleep apnea    uses c-pap    Past Surgical History:  Past Surgical History:  Procedure Laterality Date   eyelid surgery  04/11/2018   Bil   WISDOM TOOTH EXTRACTION      Allergies:  Allergies  Allergen Reactions   Amlodipine     cough   Lisinopril     cough   Sudafed [Pseudoephedrine Hcl] Other (See Comments)    prostatism    Family History:  Family  History  Problem Relation Age of Onset   Hypertension Mother    Arthritis Mother    Prostate cancer Father    Healthy Sister    Colon cancer Neg Hx    Esophageal cancer Neg Hx    Stomach cancer Neg Hx    Rectal cancer Neg Hx     Social History:  Social History   Tobacco Use   Smoking status: Former    Packs/day: .3    Types: Cigarettes    Quit date: 10/2017    Years since quitting: 4.9   Smokeless tobacco: Never  Vaping Use   Vaping Use: Never used  Substance Use Topics   Alcohol use: Yes    Alcohol/week: 7.0 standard drinks of alcohol    Types: 7 Standard drinks or equivalent per week   Drug use: No    ROS: Constitutional:  Negative for fever, chills, weight loss CV: Negative for chest pain, previous MI, hypertension Respiratory:  Negative for shortness of breath, wheezing, sleep apnea, frequent cough GI:  Negative for nausea, vomiting, bloody stool, GERD  Physical exam: BP (!) 141/83   Pulse (!) 109   Ht 5\' 7"  (1.702 m)   Wt 165 lb (74.8 kg)   BMI 25.84 kg/m  GENERAL APPEARANCE:  Well appearing, well developed, well nourished, NAD HEENT:  Atraumatic, normocephalic, oropharynx clear NECK:  Supple without lymphadenopathy or thyromegaly ABDOMEN:  Soft, non-tender, no masses EXTREMITIES:  Moves all extremities well, without clubbing, cyanosis, or edema NEUROLOGIC:  Alert and oriented x 3, normal gait, CN II-XII grossly intact MENTAL STATUS:  appropriate BACK:  Non-tender to palpation, No CVAT SKIN:  Warm, dry, and intact   Results: Results for orders placed or performed in visit on 09/21/22 (from the past 24 hour(s))  Urinalysis, Routine w reflex microscopic     Status: Abnormal   Collection Time: 09/21/22  3:13 PM  Result Value Ref Range   Specific Gravity, UA 1.015 1.005 - 1.030   pH, UA 7.0 5.0 - 7.5   Color, UA Yellow Yellow   Appearance Ur Clear Clear   Leukocytes,UA Trace (A) Negative   Protein,UA Negative Negative/Trace   Glucose, UA Negative  Negative   Ketones, UA Negative Negative   RBC, UA Negative Negative   Bilirubin, UA Negative Negative   Urobilinogen, Ur 0.2 0.2 - 1.0 mg/dL   Nitrite, UA Negative Negative   Microscopic Examination See below:    Narrative   Performed at:  01 - [email or web address]  [facility] [address], [place], [place]  [place] Lab Director: [id number], Phone:  [phone]  Microscopic Examination     Status: Abnormal   Collection Time: 09/21/22  3:13 PM   Urine  Result Value Ref Range   WBC, UA 0-5 0 - 5 /hpf   RBC, Urine None  seen 0 - 2 /hpf   Epithelial Cells (non renal) 0-10 0 - 10 /hpf   Renal Epithel, UA None seen None seen /hpf   Casts None seen None seen /lpf   Cast Type None seen N/A   Crystals None seen N/A   Crystal Type None seen N/A   Mucus, UA None seen Not Estab.   Bacteria, UA Few (A) None seen/Few   Yeast, UA None seen None seen   Trichomonas, UA None seen None seen   Narrative   Performed at:  01 [email or web address]  [facility] [address], [place], [place]  [id number] Lab Director: Corinna LinesAmy French [place], Phone:  [phone]

## 2022-09-27 DIAGNOSIS — Z713 Dietary counseling and surveillance: Secondary | ICD-10-CM | POA: Diagnosis not present

## 2022-09-28 ENCOUNTER — Encounter: Payer: Self-pay | Admitting: Internal Medicine

## 2022-09-28 ENCOUNTER — Other Ambulatory Visit: Payer: Self-pay | Admitting: Internal Medicine

## 2022-09-28 DIAGNOSIS — I10 Essential (primary) hypertension: Secondary | ICD-10-CM

## 2022-10-05 ENCOUNTER — Ambulatory Visit (INDEPENDENT_AMBULATORY_CARE_PROVIDER_SITE_OTHER): Payer: BC Managed Care – PPO | Admitting: Internal Medicine

## 2022-10-05 ENCOUNTER — Encounter: Payer: Self-pay | Admitting: Internal Medicine

## 2022-10-05 VITALS — BP 128/84 | HR 88 | Temp 97.6°F | Resp 16 | Ht 67.0 in | Wt 171.0 lb

## 2022-10-05 DIAGNOSIS — D72824 Basophilia: Secondary | ICD-10-CM | POA: Diagnosis not present

## 2022-10-05 DIAGNOSIS — D72821 Monocytosis (symptomatic): Secondary | ICD-10-CM

## 2022-10-05 DIAGNOSIS — R82998 Other abnormal findings in urine: Secondary | ICD-10-CM

## 2022-10-05 DIAGNOSIS — I10 Essential (primary) hypertension: Secondary | ICD-10-CM | POA: Diagnosis not present

## 2022-10-05 DIAGNOSIS — E871 Hypo-osmolality and hyponatremia: Secondary | ICD-10-CM | POA: Diagnosis not present

## 2022-10-05 LAB — CBC WITH DIFFERENTIAL/PLATELET
Basophils Absolute: 0 10*3/uL (ref 0.0–0.1)
Basophils Relative: 0.6 % (ref 0.0–3.0)
Eosinophils Absolute: 0.1 10*3/uL (ref 0.0–0.7)
Eosinophils Relative: 1.4 % (ref 0.0–5.0)
HCT: 42.1 % (ref 39.0–52.0)
Hemoglobin: 14.3 g/dL (ref 13.0–17.0)
Lymphocytes Relative: 29.8 % (ref 12.0–46.0)
Lymphs Abs: 1.8 10*3/uL (ref 0.7–4.0)
MCHC: 34 g/dL (ref 30.0–36.0)
MCV: 107.1 fl — ABNORMAL HIGH (ref 78.0–100.0)
Monocytes Absolute: 1.2 10*3/uL — ABNORMAL HIGH (ref 0.1–1.0)
Monocytes Relative: 19.5 % — ABNORMAL HIGH (ref 3.0–12.0)
Neutro Abs: 2.9 10*3/uL (ref 1.4–7.7)
Neutrophils Relative %: 48.7 % (ref 43.0–77.0)
Platelets: 302 10*3/uL (ref 150.0–400.0)
RBC: 3.93 Mil/uL — ABNORMAL LOW (ref 4.22–5.81)
RDW: 12.7 % (ref 11.5–15.5)
WBC: 5.9 10*3/uL (ref 4.0–10.5)

## 2022-10-05 LAB — BASIC METABOLIC PANEL
BUN: 13 mg/dL (ref 6–23)
CO2: 30 mEq/L (ref 19–32)
Calcium: 9.3 mg/dL (ref 8.4–10.5)
Chloride: 100 mEq/L (ref 96–112)
Creatinine, Ser: 1.11 mg/dL (ref 0.40–1.50)
GFR: 74.98 mL/min (ref >60.00)
Glucose, Bld: 93 mg/dL (ref 70–99)
Potassium: 3.9 mEq/L (ref 3.5–5.1)
Sodium: 140 mEq/L (ref 135–145)

## 2022-10-05 NOTE — Progress Notes (Signed)
Subjective:  Patient ID: Caleb Obrien, male    DOB: 06/15/1965  Age: 57 y.o. MRN: [id number]  CC: Hypertension   HPI [name] presents for f/up ----  He has been reviewing his labs and is concerned that a recent UA showed leukocyte esterase and for years has had a slight increase in abs monocytes, low RBC, and elevated MCV.  He also thinks that he has an increase in basophils.  He has been diagnosed with prostate cancer.    Outpatient Medications Prior to Visit  Medication Sig Dispense Refill   Albuterol-Budesonide (AIRSUPRA) 90-80 MCG/ACT AERO Inhale 2 puffs into the lungs 4 (four) times daily. 32.1 g 1   ALPRAZolam (XANAX) 0.25 MG tablet TAKE 1/2 TABLET BY MOUTH DAILY AS NEEDED 30 tablet 3   aspirin EC 81 MG tablet Take 1 tablet (81 mg total) by mouth daily. Swallow whole. 90 tablet 1   atorvastatin (LIPITOR) 40 MG tablet Take 1 tablet (40 mg total) by mouth daily. 90 tablet 1   buPROPion ER (WELLBUTRIN SR) 100 MG 12 hr tablet Take 1 tablet (100 mg total) by mouth 2 (two) times daily. 180 tablet 0   carvedilol (COREG) 3.125 MG tablet TAKE 1 TABLET BY MOUTH TWICE A DAY WITH A MEAL 180 tablet 0   cetirizine (ZYRTEC) 10 MG tablet TAKE 1 TABLET(10 MG) BY MOUTH DAILY 30 tablet 11   Cholecalciferol 2000 units TABS Take 1 tablet (2,000 Units total) by mouth daily. 90 tablet 1   dexlansoprazole (DEXILANT) 60 MG capsule TAKE 1 CAPSULE BY MOUTH EVERY DAY 90 capsule 1   emtricitabine-tenofovir (TRUVADA) 200-300 MG tablet Take 1 tablet by mouth once daily 90 tablet 0   indapamide (LOZOL) 1.25 MG tablet Take 1 tablet (1.25 mg total) by mouth daily. 90 tablet 1   irbesartan (AVAPRO) 300 MG tablet TAKE 1 TABLET BY MOUTH DAILY 90 tablet 0   tadalafil (CIALIS) 20 MG tablet TAKE ONE TABLET BY MOUTH DAILY AS NEEDED FOR ERECTILE DYSFUNCTION 30 tablet 1   triamcinolone cream (KENALOG) 0.5 % Apply 1 Application topically 2 (two) times daily. 30 g 0   No facility-administered medications prior  to visit.    ROS Review of Systems  Constitutional: Negative.  Negative for diaphoresis, fatigue and fever.  HENT: Negative.    Eyes: Negative.   Respiratory:  Negative for cough, chest tightness, shortness of breath and wheezing.   Cardiovascular:  Negative for chest pain, palpitations and leg swelling.  Gastrointestinal:  Negative for abdominal pain, constipation, diarrhea, nausea and vomiting.  Endocrine: Negative.   Genitourinary: Negative.  Negative for difficulty urinating.  Musculoskeletal: Negative.  Negative for arthralgias, joint swelling and myalgias.  Skin: Negative.  Negative for rash.  Neurological:  Negative for dizziness, weakness, light-headedness and headaches.  Hematological:  Negative for adenopathy. Does not bruise/bleed easily.  Psychiatric/Behavioral:  Negative for decreased concentration, dysphoric mood and sleep disturbance. The patient is nervous/anxious.     Objective:  BP 128/84 (BP Location: Left Arm, Patient Position: Sitting, Cuff Size: Large)   Pulse 88   Temp 97.6 F (36.4 C) (Oral)   Resp 16   Ht 5\' 7"  (1.702 m)   Wt 171 lb (77.6 kg)   SpO2 96%   BMI 26.78 kg/m   BP Readings from Last 3 Encounters:  10/05/22 128/84  09/21/22 (!) 141/83  09/20/22 128/78    Wt Readings from Last 3 Encounters:  10/05/22 171 lb (77.6 kg)  09/21/22 165 lb (74.8 kg)  09/20/22 171 lb (77.6 kg)    Physical Exam Vitals reviewed.  Constitutional:      Appearance: He is not ill-appearing.  HENT:     Nose: Nose normal.     Mouth/Throat:     Mouth: Mucous membranes are moist.  Eyes:     General: No scleral icterus.    Conjunctiva/sclera: Conjunctivae normal.  Cardiovascular:     Rate and Rhythm: Normal rate and regular rhythm.     Heart sounds: No murmur heard. Pulmonary:     Breath sounds: No stridor. No wheezing, rhonchi or rales.  Abdominal:     General: Abdomen is flat.     Palpations: There is no mass.     Tenderness: There is no abdominal  tenderness. There is no guarding.     Hernia: No hernia is present.  Musculoskeletal:        General: Normal range of motion.     Cervical back: Neck supple.     Right lower leg: No edema.     Left lower leg: No edema.  Lymphadenopathy:     Cervical: No cervical adenopathy.  Skin:    General: Skin is warm and dry.     Findings: No rash.  Neurological:     General: No focal deficit present.     Mental Status: He is alert. Mental status is at baseline.  Psychiatric:        Mood and Affect: Mood normal.        Behavior: Behavior normal.        Thought Content: Thought content normal.        Judgment: Judgment normal.     Lab Results  Component Value Date   WBC 5.9 10/05/2022   HGB 14.3 10/05/2022   HCT 42.1 10/05/2022   PLT 302.0 10/05/2022   GLUCOSE 93 10/05/2022   CHOL 156 06/09/2022   TRIG 99.0 06/09/2022   HDL 53.30 06/09/2022   LDLDIRECT 144.4 01/27/2012   LDLCALC 83 06/09/2022   ALT 38 06/09/2022   AST 26 06/09/2022   NA 140 10/05/2022   K 3.9 10/05/2022   CL 100 10/05/2022   CREATININE 1.11 10/05/2022   BUN 13 10/05/2022   CO2 30 10/05/2022   TSH 3.41 06/09/2022   PSA 5.65 (H) 06/29/2022    NM PET (PSMA) SKULL TO MID THIGH  Result Date: 09/21/2022 CLINICAL DATA:  Prostate carcinoma with biochemical recurrence. EXAM: NUCLEAR MEDICINE PET SKULL BASE TO THIGH TECHNIQUE: 9.73 mCi F18 Piflufolastat (Pylarify) was injected intravenously. Full-ring PET imaging was performed from the skull base to thigh after the radiotracer. CT data was obtained and used for attenuation correction and anatomic localization. COMPARISON:  None Available. FINDINGS: NECK No radiotracer activity in neck lymph nodes. Incidental CT finding: None. CHEST No radiotracer accumulation within mediastinal or hilar lymph nodes. No suspicious pulmonary nodules on the CT scan. Incidental CT finding: None. ABDOMEN/PELVIS Prostate: Focal activity in the RIGHT apical region of the prostate gland SUV max  equal 6.6. Additional scattered more moderate radiotracer activity within the gland Lymph nodes: No abnormal radiotracer accumulation within pelvic or abdominal nodes. Liver: No evidence of liver metastasis. Incidental CT finding: None. SKELETON No focal activity to suggest skeletal metastasis. IMPRESSION: 1. Focal activity in the RIGHT apical region of the prostate gland consistent with primary prostate adenocarcinoma. 2. No evidence of metastatic adenopathy in the pelvis or periaortic retroperitoneum. 3. No evidence of visceral metastasis or skeletal metastasis. Electronically Signed   By: Genevive Bi  M.D.   On: 09/21/2022 08:41    Assessment & Plan:   Hyponatremia- Sodium is normal.  Urine sodium is above 60. 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  H/H and platelets are normal. SPEP is normal.  I informed him that the elevated MCV may be related to alcohol intake. -     CBC with Differential/Platelet; Future -     Protein electrophoresis, serum; Future  Essential hypertension, benign- His blood pressure is adequately well-controlled.  Electrolytes and renal function are normal. -     Basic metabolic panel; Future -     CBC with Differential/Platelet; Future  Leukocytes in urine -     CULTURE, URINE COMPREHENSIVE; Future -     GC/Chlamydia Probe Amp; Future     Follow-up: Return in about 4 months (around 02/04/2023).  Sanda Linger, MD

## 2022-10-08 LAB — CULTURE, URINE COMPREHENSIVE: RESULT:: NO GROWTH

## 2022-10-08 LAB — PROTEIN ELECTROPHORESIS, SERUM
Albumin ELP: 4.4 g/dL (ref 3.8–4.8)
Alpha 1: 0.3 g/dL (ref 0.2–0.3)
Alpha 2: 0.5 g/dL (ref 0.5–0.9)
Beta 2: 0.4 g/dL (ref 0.2–0.5)
Beta Globulin: 0.5 g/dL (ref 0.4–0.6)
Gamma Globulin: 1 g/dL (ref 0.8–1.7)
Total Protein: 7 g/dL (ref 6.1–8.1)

## 2022-10-08 LAB — SODIUM, URINE, RANDOM: Sodium, Ur: 86 mmol/L (ref 28–272)

## 2022-10-08 LAB — GC/CHLAMYDIA PROBE AMP
Chlamydia trachomatis, NAA: NEGATIVE
Neisseria Gonorrhoeae by PCR: NEGATIVE

## 2022-10-11 ENCOUNTER — Other Ambulatory Visit: Payer: Self-pay | Admitting: Urology

## 2022-10-11 DIAGNOSIS — C61 Malignant neoplasm of prostate: Secondary | ICD-10-CM | POA: Diagnosis not present

## 2022-10-14 DIAGNOSIS — G4733 Obstructive sleep apnea (adult) (pediatric): Secondary | ICD-10-CM | POA: Diagnosis not present

## 2022-10-14 NOTE — Patient Instructions (Signed)
SURGICAL WAITING ROOM VISITATION  Patients having surgery or a procedure may have no more than 2 support people in the waiting area - these visitors may rotate.    Children under the age of 51 must have an adult with them who is not the patient.  Due to an increase in RSV and influenza rates and associated hospitalizations, children ages 34 and under may not visit patients in [facility] hospitals.  If the patient needs to stay at the hospital during part of their recovery, the visitor guidelines for inpatient rooms apply. Pre-op nurse will coordinate an appropriate time for 1 support person to accompany patient in pre-op.  This support person may not rotate.    Please refer to the [facility] website for the visitor guidelines for Inpatients (after your surgery is over and you are in a regular room).    Your procedure is scheduled on: 10/24/22   Report to [facility] Main Entrance    Report to admitting at  9 AM   Call this number if you have problems the morning of surgery [phone]   Follow a clear liquid diet the day before surgery.   Do drink liquids:After Midnight.  Water Non-Citrus Juices (without pulp, NO RED-Apple, White grape, White cranberry) Black Coffee (NO MILK/CREAM OR CREAMERS, sugar ok)  Clear Tea (NO MILK/CREAM OR CREAMERS, sugar ok) regular and decaf                             Plain Jell-O (NO RED)                                           Fruit ices (not with fruit pulp, NO RED)                                     Popsicles (NO RED)                                                               Sports drinks like Gatorade (NO RED)             FOLLOW BOWEL PREP AND ANY ADDITIONAL PRE OP INSTRUCTIONS YOU RECEIVED FROM YOUR SURGEON'S OFFICE!!!     Oral Hygiene is also important to reduce your risk of infection.                                    Remember - BRUSH YOUR TEETH THE MORNING OF SURGERY WITH YOUR REGULAR TOOTHPASTE  DENTURES WILL BE REMOVED  PRIOR TO SURGERY PLEASE DO NOT APPLY "Poly grip" OR ADHESIVES!!!   Take these medicines the morning of surgery with A SIP OF WATER:   Alprazolam  Atorvastatin  Bupropion  Carvedilol  Dexlansoprazole  Loratadine                      You may not have any metal on your body including hair pins, jewelry, and body piercing  Do not wear make-up, lotions, powders, cologne, or deodorant              Men may shave face and neck.   Do not bring valuables to the hospital. [facility] IS NOT             RESPONSIBLE   FOR VALUABLES.   Contacts, glasses, dentures or bridgework may not be worn into surgery.   Bring small overnight bag day of surgery.   DO NOT BRING YOUR HOME MEDICATIONS TO THE HOSPITAL. PHARMACY WILL DISPENSE MEDICATIONS LISTED ON YOUR MEDICATION LIST TO YOU DURING YOUR ADMISSION IN THE HOSPITAL!   Special Instructions: Bring a copy of your healthcare power of attorney and living will documents the day of surgery if you haven't scanned them before.              Please read over the following fact sheets you were given: IF YOU HAVE QUESTIONS ABOUT YOUR PRE-OP INSTRUCTIONS PLEASE CALL [name]   If you received a COVID test during your pre-op visit  it is requested that you wear a mask when out in public, stay away from anyone that may not be feeling well and notify your surgeon if you develop symptoms. If you test positive for Covid or have been in contact with anyone that has tested positive in the last 10 days please notify you surgeon.    [place] [place] - Preparing for Surgery Before surgery, you can play an important role.  Because skin is not sterile, your skin needs to be as free of germs as possible.  You can reduce the number of germs on your skin by washing with CHG (chlorahexidine gluconate) soap before surgery.  CHG is an antiseptic cleaner which kills germs and bonds with the skin to continue killing germs even after washing. Please DO NOT use if  you have an allergy to CHG or antibacterial soaps.  If your skin becomes reddened/irritated stop using the CHG and inform your nurse when you arrive at Short Stay. Do not shave (including legs and underarms) for at least 48 hours prior to the first CHG shower.  You may shave your face/neck.  Please follow these instructions carefully:  1.  Shower with CHG Soap the night before surgery and the  morning of surgery.  2.  If you choose to wash your hair, wash your hair first as usual with your normal  shampoo.  3.  After you shampoo, rinse your hair and body thoroughly to remove the shampoo.                             4.  Use CHG as you would any other liquid soap.  You can apply chg directly to the skin and wash.  Gently with a scrungie or clean washcloth.  5.  Apply the CHG Soap to your body ONLY FROM THE NECK DOWN.   Do   not use on face/ open                           Wound or open sores. Avoid contact with eyes, ears mouth and   genitals (private parts).                       Wash face,  Genitals (private parts) with your normal soap.  6.  Wash thoroughly, paying special attention to the area where your    surgery  will be performed.  7.  Thoroughly rinse your body with warm water from the neck down.  8.  DO NOT shower/wash with your normal soap after using and rinsing off the CHG Soap.                9.  Pat yourself dry with a clean towel.            10.  Wear clean pajamas.            11.  Place clean sheets on your bed the night of your first shower and do not  sleep with pets. Day of Surgery : Do not apply any lotions/deodorants the morning of surgery.  Please wear clean clothes to the hospital/surgery center.  FAILURE TO FOLLOW THESE INSTRUCTIONS MAY RESULT IN THE CANCELLATION OF YOUR SURGERY  PATIENT SIGNATURE_________________________________  NURSE SIGNATURE__________________________________  ________________________________________________________________________ WHAT  IS A BLOOD TRANSFUSION? Blood Transfusion Information  A transfusion is the replacement of blood or some of its parts. Blood is made up of multiple cells which provide different functions. Red blood cells carry oxygen and are used for blood loss replacement. White blood cells fight against infection. Platelets control bleeding. Plasma helps clot blood. Other blood products are available for specialized needs, such as hemophilia or other clotting disorders. BEFORE THE TRANSFUSION  Who gives blood for transfusions?  Healthy volunteers who are fully evaluated to make sure their blood is safe. This is blood bank blood. Transfusion therapy is the safest it has ever been in the practice of medicine. Before blood is taken from a donor, a complete history is taken to make sure that person has no history of diseases nor engages in risky social behavior (examples are intravenous drug use or sexual activity with multiple partners). The donor's travel history is screened to minimize risk of transmitting infections, such as malaria. The donated blood is tested for signs of infectious diseases, such as HIV and hepatitis. The blood is then tested to be sure it is compatible with you in order to minimize the chance of a transfusion reaction. If you or a relative donates blood, this is often done in anticipation of surgery and is not appropriate for emergency situations. It takes many days to process the donated blood. RISKS AND COMPLICATIONS Although transfusion therapy is very safe and saves many lives, the main dangers of transfusion include:  Getting an infectious disease. Developing a transfusion reaction. This is an allergic reaction to something in the blood you were given. Every precaution is taken to prevent this. The decision to have a blood transfusion has been considered carefully by your caregiver before blood is given. Blood is not given unless the benefits outweigh the risks. AFTER THE  TRANSFUSION Right after receiving a blood transfusion, you will usually feel much better and more energetic. This is especially true if your red blood cells have gotten low (anemic). The transfusion raises the level of the red blood cells which carry oxygen, and this usually causes an energy increase. The nurse administering the transfusion will monitor you carefully for complications. HOME CARE INSTRUCTIONS  No special instructions are needed after a transfusion. You may find your energy is better. Speak with your caregiver about any limitations on activity for underlying diseases you may have. SEEK MEDICAL CARE IF:  Your condition is not improving after your transfusion. You develop redness or irritation at the intravenous (  IV) site. SEEK IMMEDIATE MEDICAL CARE IF:  Any of the following symptoms occur over the next 12 hours: Shaking chills. You have a temperature by mouth above 102 F (38.9 C), not controlled by medicine. Chest, back, or muscle pain. People around you feel you are not acting correctly or are confused. Shortness of breath or difficulty breathing. Dizziness and fainting. You get a rash or develop hives. You have a decrease in urine output. Your urine turns a dark color or changes to pink, red, or brown. Any of the following symptoms occur over the next 10 days: You have a temperature by mouth above 102 F (38.9 C), not controlled by medicine. Shortness of breath. Weakness after normal activity. The white part of the eye turns yellow (jaundice). You have a decrease in the amount of urine or are urinating less often. Your urine turns a dark color or changes to pink, red, or brown. Document Released: 05/27/2000 Document Revised: 08/22/2011 Document Reviewed: 01/14/2008 [facility] Patient Information 2014 [place], [place].

## 2022-10-14 NOTE — Progress Notes (Signed)
COVID Vaccine received:  []  No [x]  Yes Date of any COVID positive Test in last 90 days:No  PCP - Sanda Linger Cardiologist - N/A  Chest x-ray -  CT chest 03/16/22 Epic EKG -  06/09/22 Epic Stress Test - More than 5years ago ECHO - N/A Cardiac Cath - N/A  Bowel Prep - []  No  [x]   Yes ___Mag citrat and Fleets. Pt. Aware.___  Pacemaker / ICD device [x]  No []  Yes   Spinal Cord Stimulator:[x]  No []  Yes       History of Sleep Apnea? []  No [x]  Yes   CPAP used?- [x]  No []  Yes    Does the patient monitor blood sugar?          []  No []  Yes  [x]  N/A  Patient has: [x]  NO Hx DM   []  Pre-DM                 []  DM1  []   DM2 Does patient have a [employer] or Dexacom? []  No []  Yes   Fasting Blood Sugar Ranges-  Checks Blood Sugar _____ times a day  GLP1 agonist / usual dose - N/A GLP1 instructions:  SGLT-2 inhibitors / usual dose - N/A SGLT-2 instructions:   Blood Thinner / Instructions: N/A Aspirin Instructions:ASA 81mg  the patient. Has been off of it x2 weeks  Comments:   Activity level: Patient is able / unable to climb a flight of stairs without difficulty; [x]  No CP  [x]  No SOB, but would have ___   Patient can perform ADLs without assistance.   Anesthesia review:   Patient denies shortness of breath, fever, cough and chest pain at PAT appointment.  Patient verbalized understanding and agreement to the Pre-Surgical Instructions that were given to them at this PAT appointment. Patient was also educated of the need to review these PAT instructions again prior to his/her surgery.I reviewed the appropriate phone numbers to call if they have any and questions or concerns.

## 2022-10-17 NOTE — Progress Notes (Signed)
COVID Vaccine received:  []  No [x]  Yes Date of any COVID positive Test in last 90 days:   PCP - Sanda Linger Cardiologist -    Chest x-ray -  CT chest 03/16/22 Epic EKG -  06/09/22 Epic Stress Test -  ECHO -  Cardiac Cath -    Bowel Prep - []  No  []   Yes ______   Pacemaker / ICD device []  No []  Yes   Spinal Cord Stimulator:[]  No []  Yes       History of Sleep Apnea? []  No [x]  Yes   CPAP used?- []  No [x]  Yes     Does the patient monitor blood sugar?          []  No []  Yes  []  N/A   Patient has: [x]  NO Hx DM   []  Pre-DM                 []  DM1  []   DM2 Does patient have a [employer] or Dex[employer]? []  No []  Yes   Fasting Blood Sugar Ranges-  Checks Blood Sugar _____ times a day   GLP1 agonist / usual dose -  GLP1 instructions:  SGLT-2 inhibitors / usual dose -  SGLT-2 instructions:    Blood Thinner / Instructions: N/A Aspirin Instructions:   Comments:    Activity level: Patient is able / unable to climb a flight of stairs without difficulty; []  No CP  []  No SOB, but would have ___   Patient can / can not perform ADLs without assistance.    Anesthesia review:    Patient denies shortness of breath, fever, cough and chest pain at PAT appointment.   Patient verbalized understanding and agreement to the Pre-Surgical Instructions that were given to them at this PAT appointment. Patient was also educated of the need to review these PAT instructions again prior to his/her surgery.I reviewed the appropriate phone numbers to call if they have any and questions or concerns.

## 2022-10-18 ENCOUNTER — Encounter (HOSPITAL_COMMUNITY): Payer: Self-pay

## 2022-10-18 ENCOUNTER — Encounter (HOSPITAL_COMMUNITY)
Admission: RE | Admit: 2022-10-18 | Discharge: 2022-10-18 | Disposition: A | Discharge status: Home / Self Care | Payer: BC Managed Care – PPO | Source: Ambulatory Visit | Attending: Urology | Admitting: Urology

## 2022-10-18 ENCOUNTER — Other Ambulatory Visit: Payer: Self-pay

## 2022-10-18 DIAGNOSIS — R278 Other lack of coordination: Secondary | ICD-10-CM | POA: Diagnosis not present

## 2022-10-18 DIAGNOSIS — N3943 Post-void dribbling: Secondary | ICD-10-CM | POA: Diagnosis not present

## 2022-10-18 DIAGNOSIS — Z01812 Encounter for preprocedural laboratory examination: Secondary | ICD-10-CM | POA: Insufficient documentation

## 2022-10-18 HISTORY — DX: Malignant (primary) neoplasm, unspecified: C80.1

## 2022-10-18 HISTORY — DX: Essential (primary) hypertension: I10

## 2022-10-18 HISTORY — DX: Emphysema, unspecified: J43.9

## 2022-10-18 LAB — TYPE AND SCREEN: ABO/RH(D): O POS

## 2022-10-21 NOTE — H&P (Signed)
Office Visit Report     10/11/2022   --------------------------------------------------------------------------------   Caleb Obrien  MRN: [id number]  DOB: 02-01-1966, 57 year old Male  SSN:    PRIMARY CARE:     REFERRING:  Etta Grandchild, MD  PROVIDER:  Heloise Purpura, M.D.  LOCATION:  [facility] [phone]     --------------------------------------------------------------------------------   CC/HPI: CC: Prostate Cancer   Physician requesting consult: Dr. Rosilyn Mings  PCP: Dr. 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   Family history: His father was diagnosed with prostate cancer at age 81 and was treated with brachytherapy. He is alive in his mid 56s. His mother is also alive in her mid 59s.   Imaging studies: PSMA PET scan (09/19/19) - negative for metastatic disease, uptake on right apex of prostate   PMH: He has a history of hyperlipidemia.  PSH: No abdominal surgeries.   TNM stage: cT1c N0 M0  PSA: 4.7  Gleason score: 4+4=8 (GG 4)  Biopsy (08/16/22 - read by Dr. Adine Madura, [facility]): 3/12 cores positive  Left: Benign  Right: 3/6 cores positive - R apex (2%, 3+3=6), R mid (40%, 4+4=8), R lateral mid (9%, 4+4=8)  Prostate volume: 24.4 cc   Nomogram  OC disease: 62%  EPE: 35%  SVI: 5%  LNI: 8%  PFS (5 year, 10 year): 62%, 46%   Urinary function: IPSS is 8. He does have postvoid dribbling and does wear an undergarment when he wears khaki pants.  Erectile function: SHIM score is 18. He will occasionally use tadalafil 20 mg on demand but does not require this medication most of the time.     ALLERGIES: No Allergies    MEDICATIONS: Aspirin  Atorvastatin Calcium  Carvedilol  Irbesartan 150 mg tablet  Vitamin B12   Vitamin C  Vitamin D3  Wellbutrin Sr     GU PSH: No GU PSH      PSH Notes: eye lid 2019   NON-GU PSH: No Non-GU PSH    GU PMH: Prostate Cancer    NON-GU PMH: Anxiety Asthma GERD Hypercholesterolemia Hypertension Sleep Apnea    FAMILY HISTORY: 1 son - Runs in Family Prostate Cancer - Father   SOCIAL HISTORY: Marital Status: Divorced Preferred Language: English; Ethnicity: Not Hispanic Or Latino; Race: White Current Smoking Status: Patient does not smoke anymore. Has not smoked since 05/13/2022.   Tobacco Use Assessment Completed: Used Tobacco in last 30 days? Does drink.  Drinks 3 caffeinated drinks per day. 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    REVIEW OF SYSTEMS:    GU Review Male:   Patient reports frequent urination, hard to postpone urination, get up at night to urinate, and leakage of urine. Patient denies burning/ pain with urination, stream starts and stops, trouble starting your streams, and have to strain to urinate .  Gastrointestinal (Upper):   Patient denies nausea and vomiting.  Gastrointestinal (Lower):   Patient denies diarrhea and constipation.  Constitutional:   Patient denies fever, fatigue, weight loss, and night sweats.  Skin:   Patient denies skin rash/ lesion and itching.  Eyes:   Patient denies blurred vision and double vision.  Ears/ Nose/ Throat:   Patient denies sore throat and sinus problems.  Hematologic/Lymphatic:  Patient denies swollen glands and easy bruising.  Cardiovascular:   Patient denies leg swelling and chest pains.  Respiratory:   Patient denies cough and shortness of breath.  Endocrine:   Patient denies excessive thirst.  Musculoskeletal:   Patient denies back pain and joint pain.  Neurological:   Patient denies headaches and dizziness.  Psychologic:   Patient denies depression and anxiety.   VITAL SIGNS:      10/11/2022 09:30 AM  Weight 165 lb / 74.84 kg  Height 67 in / 170.18 cm  BP 126/86 mmHg  Pulse 90 /min   Temperature 97.5 F / 36.3 C  BMI 25.8 kg/m   GU PHYSICAL EXAMINATION:    Prostate: Prostate about 30 grams. He does have some slight induration toward the right lateral apex without evidence of extraprostatic disease.   MULTI-SYSTEM PHYSICAL EXAMINATION:    Constitutional: Well-nourished. No physical deformities. Normally developed. Good grooming.  Respiratory: No labored breathing, no use of accessory muscles. Clear bilaterally.  Cardiovascular: Normal temperature, normal extremity pulses, no swelling, no varicosities. Regular rate and rhythm.  Gastrointestinal: No mass, no tenderness, no rigidity, non obese abdomen.      Complexity of Data:  Lab Test Review:   PSA  Records Review:   Pathology Reports, Previous Patient Records  X-Ray Review: PET- PSMA Scan: Reviewed Films.     PROCEDURES:          Urinalysis Dipstick Dipstick Cont'd  Color: Yellow Bilirubin: Neg mg/dL  Appearance: Clear Ketones: Neg mg/dL  Specific Gravity: <=1.610 Blood: Neg ery/uL  pH: 7.5 Protein: Neg mg/dL  Glucose: Neg mg/dL Urobilinogen: 0.2 mg/dL    Nitrites: Neg    Leukocyte Esterase: Neg leu/uL    ASSESSMENT:      ICD-10 Details  1 GU:   Prostate Cancer - C61    PLAN:           Schedule Return Visit/Planned Activity: Next Available Appointment - PT Referral             Note: Please schedule patient for preoperative PT prior to radical prostatectomy.    Return Visit/Planned Activity: Other See Visit Notes             Note: Will call to schedule surgery          Document Letter(s):  Created for Patient: Clinical Summary         Notes:   1. High risk prostate cancer: I had a detailed discussion with Caleb Obrien today regarding his prostate cancer situation. Considering his young age and long life expectancy as well as his high risk disease parameters, he is most interested in proceeding with surgical therapy. The patient was counseled about the natural history of prostate cancer and the standard  treatment options that are available for prostate cancer. It was explained to him how his age and life expectancy, clinical stage, Gleason score/prognostic grade group, and PSA (and PSA density) affect his prognosis, the decision to proceed with additional staging studies, as well as how that information influences recommended treatment strategies. We discussed the roles for active surveillance, radiation therapy, surgical therapy, androgen deprivation, as well as ablative therapy and other investigational options for the treatment of prostate cancer as appropriate to his individual cancer situation. We discussed the risks and benefits of these options with regard to their impact on cancer control and also in terms of potential adverse events, complications, and impact on quality of life particularly related to urinary and sexual function. The patient was  encouraged to ask questions throughout the discussion today and all questions were answered to his stated satisfaction. In addition, the patient was provided with and/or directed to appropriate resources and literature for further education about prostate cancer and treatment options. We discussed surgical therapy for prostate cancer including the different available surgical approaches. We discussed, in detail, the risks and expectations of surgery with regard to cancer control, urinary control, and erectile function as well as the expected postoperative recovery process. Additional risks of surgery including but not limited to bleeding, infection, hernia formation, nerve damage, lymphocele formation, bowel/rectal injury potentially necessitating colostomy, damage to the urinary tract resulting in urine leakage, urethral stricture, and the cardiopulmonary risks such as myocardial infarction, stroke, death, venothromboembolism, etc. were explained. The risk of open surgical conversion for robotic/laparoscopic prostatectomy was also discussed.   I did offer him a  radiation oncology consultation but he is adamant that he wishes to proceed with surgical therapy and does not feel this is necessary. He would like to proceed as quickly as possible. He will be scheduled for a bilateral nerve sparing robot-assisted laparoscopic radical prostatectomy and bilateral pelvic lymphadenectomy.   CC: Dr. Sanda Linger        Next Appointment:      Next Appointment: 10/24/2022 11:15 AM    Appointment Type: Surgery     Location: [facility] [phone]    Provider: Heloise Purpura, M.D.    Reason for Visit: WL/OBS RA LAP RAD PROSTATECTOMY LEV 2 AND BPLND WITH AMANDA      E & M CODES: We spent 55 minutes dedicated to evaluation and management time, including face to face interaction, discussions on coordination of care, documentation, result review, and discussion with others as applicable.     * Signed by Heloise Purpura, M.D. on 10/11/22 at 4:20 PM (EDT*

## 2022-10-24 ENCOUNTER — Encounter (HOSPITAL_COMMUNITY)
Admission: RE | Disposition: A | Discharge status: Home / Self Care | Payer: Self-pay | Source: Home / Self Care | Attending: Urology

## 2022-10-24 ENCOUNTER — Observation Stay (HOSPITAL_COMMUNITY)
Admission: RE | Admit: 2022-10-24 | Discharge: 2022-10-25 | Disposition: A | Discharge status: Home / Self Care | Payer: BC Managed Care – PPO | Attending: Urology | Admitting: Urology

## 2022-10-24 ENCOUNTER — Other Ambulatory Visit: Payer: Self-pay

## 2022-10-24 ENCOUNTER — Ambulatory Visit (HOSPITAL_COMMUNITY): Payer: BC Managed Care – PPO | Admitting: Anesthesiology

## 2022-10-24 ENCOUNTER — Encounter (HOSPITAL_COMMUNITY): Payer: Self-pay | Admitting: Urology

## 2022-10-24 DIAGNOSIS — C61 Malignant neoplasm of prostate: Secondary | ICD-10-CM | POA: Diagnosis not present

## 2022-10-24 DIAGNOSIS — Z79899 Other long term (current) drug therapy: Secondary | ICD-10-CM | POA: Diagnosis not present

## 2022-10-24 DIAGNOSIS — Z7982 Long term (current) use of aspirin: Secondary | ICD-10-CM | POA: Insufficient documentation

## 2022-10-24 DIAGNOSIS — J45909 Unspecified asthma, uncomplicated: Secondary | ICD-10-CM | POA: Diagnosis not present

## 2022-10-24 DIAGNOSIS — I10 Essential (primary) hypertension: Secondary | ICD-10-CM | POA: Insufficient documentation

## 2022-10-24 HISTORY — PX: LYMPHADENECTOMY: SHX5960

## 2022-10-24 HISTORY — PX: ROBOT ASSISTED LAPAROSCOPIC RADICAL PROSTATECTOMY: SHX5141

## 2022-10-24 LAB — BASIC METABOLIC PANEL
Anion gap: 6 (ref 5–15)
Anion gap: 10 (ref 5–15)
BUN: 14 mg/dL (ref 6–20)
BUN: 17 mg/dL (ref 6–20)
CO2: 20 mmol/L — ABNORMAL LOW (ref 22–32)
CO2: 26 mmol/L (ref 22–32)
Calcium: 6.1 mg/dL — CL (ref 8.9–10.3)
Calcium: 8.5 mg/dL — ABNORMAL LOW (ref 8.9–10.3)
Chloride: 113 mmol/L — ABNORMAL HIGH (ref 98–111)
Chloride: 98 mmol/L (ref 98–111)
Creatinine, Ser: 0.71 mg/dL (ref 0.61–1.24)
Creatinine, Ser: 0.95 mg/dL (ref 0.61–1.24)
GFR, Estimated: >60 mL/min (ref >60)
GFR, Estimated: >60 mL/min (ref >60)
Glucose, Bld: 71 mg/dL (ref 70–99)
Glucose, Bld: 114 mg/dL — ABNORMAL HIGH (ref 70–99)
Potassium: 2.2 mmol/L — CL (ref 3.5–5.1)
Potassium: 3.2 mmol/L — ABNORMAL LOW (ref 3.5–5.1)
Sodium: 139 mmol/L (ref 135–145)
Sodium: 134 mmol/L — ABNORMAL LOW (ref 135–145)

## 2022-10-24 LAB — HEMOGLOBIN AND HEMATOCRIT, BLOOD
HCT: 36 % — ABNORMAL LOW (ref 39.0–52.0)
Hemoglobin: 12.3 g/dL — ABNORMAL LOW (ref 13.0–17.0)

## 2022-10-24 LAB — TYPE AND SCREEN: Antibody Screen: NEGATIVE

## 2022-10-24 LAB — ABO/RH: ABO/RH(D): O POS

## 2022-10-24 SURGERY — XI ROBOTIC ASSISTED LAPAROSCOPIC RADICAL PROSTATECTOMY LEVEL 2
Anesthesia: General

## 2022-10-24 MED ORDER — SODIUM CHLORIDE 0.9 % IV BOLUS
1000.0000 mL | Freq: Once | INTRAVENOUS | Status: AC
  Administered 2022-10-24: 1000 mL via INTRAVENOUS

## 2022-10-24 MED ORDER — ONDANSETRON HCL 4 MG/2ML IJ SOLN: 4.0000 mg | INTRAMUSCULAR | Status: DC | PRN

## 2022-10-24 MED ORDER — OXYCODONE HCL 5 MG PO TABS: 5.0000 mg | ORAL_TABLET | Freq: Once | ORAL | Status: DC | PRN

## 2022-10-24 MED ORDER — TRIPLE ANTIBIOTIC 3.5-400-5000 EX OINT: 1.0000 | TOPICAL_OINTMENT | Freq: Three times a day (TID) | CUTANEOUS | Status: DC | PRN

## 2022-10-24 MED ORDER — ROCURONIUM BROMIDE 10 MG/ML (PF) SYRINGE
PREFILLED_SYRINGE | INTRAVENOUS | Status: DC | PRN
  Administered 2022-10-24: 10 mg via INTRAVENOUS
  Administered 2022-10-24: 20 mg via INTRAVENOUS
  Administered 2022-10-24: 60 mg via INTRAVENOUS
  Administered 2022-10-24: 20 mg via INTRAVENOUS
  Administered 2022-10-24: 10 mg via INTRAVENOUS

## 2022-10-24 MED ORDER — ONDANSETRON HCL 4 MG/2ML IJ SOLN
INTRAMUSCULAR | Status: AC
  Filled 2022-10-24: qty 2

## 2022-10-24 MED ORDER — LACTATED RINGERS IV SOLN: INTRAVENOUS | Status: DC

## 2022-10-24 MED ORDER — EPHEDRINE 5 MG/ML INJ
INTRAVENOUS | Status: AC
  Filled 2022-10-24: qty 5

## 2022-10-24 MED ORDER — EPHEDRINE SULFATE-NACL 50-0.9 MG/10ML-% IV SOSY
PREFILLED_SYRINGE | INTRAVENOUS | Status: DC | PRN
  Administered 2022-10-24 (×2): 5 mg via INTRAVENOUS

## 2022-10-24 MED ORDER — DIPHENHYDRAMINE HCL 12.5 MG/5ML PO ELIX: 12.5000 mg | ORAL_SOLUTION | Freq: Four times a day (QID) | ORAL | Status: DC | PRN

## 2022-10-24 MED ORDER — LACTATED RINGERS IV SOLN: INTRAVENOUS | Status: DC | PRN

## 2022-10-24 MED ORDER — ACETAMINOPHEN 325 MG PO TABS
650.0000 mg | ORAL_TABLET | ORAL | Status: DC | PRN
  Administered 2022-10-24: 650 mg via ORAL
  Filled 2022-10-24: qty 2

## 2022-10-24 MED ORDER — MIDAZOLAM HCL 2 MG/2ML IJ SOLN
INTRAMUSCULAR | Status: AC
  Filled 2022-10-24: qty 2

## 2022-10-24 MED ORDER — ORAL CARE MOUTH RINSE: 15.0000 mL | Freq: Once | OROMUCOSAL | Status: AC

## 2022-10-24 MED ORDER — MIDAZOLAM HCL 2 MG/2ML IJ SOLN
INTRAMUSCULAR | Status: DC | PRN
  Administered 2022-10-24: 2 mg via INTRAVENOUS

## 2022-10-24 MED ORDER — CEFAZOLIN SODIUM-DEXTROSE 1-4 GM/50ML-% IV SOLN
1.0000 g | Freq: Three times a day (TID) | INTRAVENOUS | Status: AC
  Administered 2022-10-24 – 2022-10-25 (×2): 1 g via INTRAVENOUS
  Filled 2022-10-24 (×3): qty 50

## 2022-10-24 MED ORDER — BUPIVACAINE-EPINEPHRINE 0.25% -1:200000 IJ SOLN
INTRAMUSCULAR | Status: AC
  Filled 2022-10-24: qty 1

## 2022-10-24 MED ORDER — FLEET ENEMA 7-19 GM/118ML RE ENEM
1.0000 | ENEMA | Freq: Once | RECTAL | Status: DC
  Filled 2022-10-24: qty 1

## 2022-10-24 MED ORDER — PROPOFOL 10 MG/ML IV BOLUS
INTRAVENOUS | Status: AC
  Filled 2022-10-24: qty 20

## 2022-10-24 MED ORDER — SUGAMMADEX SODIUM 200 MG/2ML IV SOLN
INTRAVENOUS | Status: DC | PRN
  Administered 2022-10-24: 160 mg via INTRAVENOUS

## 2022-10-24 MED ORDER — HYOSCYAMINE SULFATE 0.125 MG SL SUBL
SUBLINGUAL_TABLET | SUBLINGUAL | Status: AC
  Administered 2022-10-24: 0.125 mg via SUBLINGUAL
  Filled 2022-10-24: qty 1

## 2022-10-24 MED ORDER — ROCURONIUM BROMIDE 10 MG/ML (PF) SYRINGE
PREFILLED_SYRINGE | INTRAVENOUS | Status: AC
  Filled 2022-10-24: qty 10

## 2022-10-24 MED ORDER — FENTANYL CITRATE PF 50 MCG/ML IJ SOSY
25.0000 ug | PREFILLED_SYRINGE | INTRAMUSCULAR | Status: DC | PRN
  Administered 2022-10-24: 25 ug via INTRAVENOUS

## 2022-10-24 MED ORDER — SODIUM CHLORIDE 0.9 % IR SOLN
Status: DC | PRN
  Administered 2022-10-24: 1000 mL

## 2022-10-24 MED ORDER — FENTANYL CITRATE (PF) 250 MCG/5ML IJ SOLN
INTRAMUSCULAR | Status: DC | PRN
  Administered 2022-10-24: 50 ug via INTRAVENOUS
  Administered 2022-10-24: 25 ug via INTRAVENOUS
  Administered 2022-10-24: 100 ug via INTRAVENOUS
  Administered 2022-10-24: 50 ug via INTRAVENOUS

## 2022-10-24 MED ORDER — PROPOFOL 10 MG/ML IV BOLUS
INTRAVENOUS | Status: DC | PRN
  Administered 2022-10-24: 200 mg via INTRAVENOUS

## 2022-10-24 MED ORDER — LIDOCAINE HCL (PF) 2 % IJ SOLN
INTRAMUSCULAR | Status: AC
  Filled 2022-10-24: qty 5

## 2022-10-24 MED ORDER — ZOLPIDEM TARTRATE 5 MG PO TABS: 5.0000 mg | ORAL_TABLET | Freq: Every evening | ORAL | Status: DC | PRN

## 2022-10-24 MED ORDER — LACTATED RINGERS IV SOLN
INTRAVENOUS | Status: DC | PRN
  Administered 2022-10-24: 1000 mL

## 2022-10-24 MED ORDER — DOCUSATE SODIUM 100 MG PO CAPS: 100.0000 mg | ORAL_CAPSULE | Freq: Two times a day (BID) | ORAL | Status: DC

## 2022-10-24 MED ORDER — ONDANSETRON HCL 4 MG/2ML IJ SOLN
INTRAMUSCULAR | Status: DC | PRN
  Administered 2022-10-24: 4 mg via INTRAVENOUS

## 2022-10-24 MED ORDER — PROMETHAZINE HCL 25 MG/ML IJ SOLN: 6.2500 mg | INTRAMUSCULAR | Status: DC | PRN

## 2022-10-24 MED ORDER — AMISULPRIDE (ANTIEMETIC) 5 MG/2ML IV SOLN: 10.0000 mg | Freq: Once | INTRAVENOUS | Status: DC | PRN

## 2022-10-24 MED ORDER — STERILE WATER FOR IRRIGATION IR SOLN
Status: DC | PRN
  Administered 2022-10-24: 1000 mL

## 2022-10-24 MED ORDER — CHLORHEXIDINE GLUCONATE 0.12 % MT SOLN
15.0000 mL | Freq: Once | OROMUCOSAL | Status: AC
  Administered 2022-10-24: 15 mL via OROMUCOSAL

## 2022-10-24 MED ORDER — CEFAZOLIN SODIUM-DEXTROSE 2-4 GM/100ML-% IV SOLN
2.0000 g | INTRAVENOUS | Status: AC
  Administered 2022-10-24: 2 g via INTRAVENOUS
  Filled 2022-10-24: qty 100

## 2022-10-24 MED ORDER — TRAMADOL HCL 50 MG PO TABS: 50.0000 mg | ORAL_TABLET | Freq: Four times a day (QID) | ORAL | 0 refills | Status: DC | PRN

## 2022-10-24 MED ORDER — KCL IN DEXTROSE-NACL 20-5-0.45 MEQ/L-%-% IV SOLN
INTRAVENOUS | Status: DC
  Filled 2022-10-24 (×4): qty 1000

## 2022-10-24 MED ORDER — KETOROLAC TROMETHAMINE 15 MG/ML IJ SOLN
15.0000 mg | Freq: Four times a day (QID) | INTRAMUSCULAR | Status: DC
  Administered 2022-10-24 – 2022-10-25 (×3): 15 mg via INTRAVENOUS
  Filled 2022-10-24 (×4): qty 1

## 2022-10-24 MED ORDER — DEXAMETHASONE SODIUM PHOSPHATE 10 MG/ML IJ SOLN
INTRAMUSCULAR | Status: DC | PRN
  Administered 2022-10-24: 10 mg via INTRAVENOUS

## 2022-10-24 MED ORDER — ACETAMINOPHEN 10 MG/ML IV SOLN: 1000.0000 mg | Freq: Once | INTRAVENOUS | Status: DC | PRN

## 2022-10-24 MED ORDER — HYOSCYAMINE SULFATE 0.125 MG SL SUBL
0.1250 mg | SUBLINGUAL_TABLET | Freq: Four times a day (QID) | SUBLINGUAL | Status: DC | PRN
  Administered 2022-10-24 – 2022-10-25 (×2): 0.125 mg via SUBLINGUAL
  Filled 2022-10-24 (×2): qty 1

## 2022-10-24 MED ORDER — SULFAMETHOXAZOLE-TRIMETHOPRIM 800-160 MG PO TABS: 1.0000 | ORAL_TABLET | Freq: Two times a day (BID) | ORAL | 0 refills | Status: DC

## 2022-10-24 MED ORDER — DOCUSATE SODIUM 100 MG PO CAPS
100.0000 mg | ORAL_CAPSULE | Freq: Two times a day (BID) | ORAL | Status: DC
  Administered 2022-10-24 – 2022-10-25 (×2): 100 mg via ORAL
  Filled 2022-10-24 (×2): qty 1

## 2022-10-24 MED ORDER — BUPIVACAINE-EPINEPHRINE 0.25% -1:200000 IJ SOLN
INTRAMUSCULAR | Status: DC | PRN
  Administered 2022-10-24: 29 mL

## 2022-10-24 MED ORDER — DEXAMETHASONE SODIUM PHOSPHATE 10 MG/ML IJ SOLN
INTRAMUSCULAR | Status: AC
  Filled 2022-10-24: qty 1

## 2022-10-24 MED ORDER — MORPHINE SULFATE (PF) 2 MG/ML IV SOLN
2.0000 mg | INTRAVENOUS | Status: DC | PRN
  Administered 2022-10-24: 2 mg via INTRAVENOUS
  Filled 2022-10-24: qty 1

## 2022-10-24 MED ORDER — FENTANYL CITRATE (PF) 250 MCG/5ML IJ SOLN
INTRAMUSCULAR | Status: AC
  Filled 2022-10-24: qty 5

## 2022-10-24 MED ORDER — LIDOCAINE 2% (20 MG/ML) 5 ML SYRINGE
INTRAMUSCULAR | Status: DC | PRN
  Administered 2022-10-24: 100 mg via INTRAVENOUS

## 2022-10-24 MED ORDER — OXYCODONE HCL 5 MG/5ML PO SOLN: 5.0000 mg | Freq: Once | ORAL | Status: DC | PRN

## 2022-10-24 MED ORDER — MAGNESIUM CITRATE PO SOLN
1.0000 | Freq: Once | ORAL | Status: DC
  Filled 2022-10-24: qty 296

## 2022-10-24 MED ORDER — HEPARIN SODIUM (PORCINE) 1000 UNIT/ML IJ SOLN
INTRAMUSCULAR | Status: AC
  Filled 2022-10-24: qty 1

## 2022-10-24 MED ORDER — DIPHENHYDRAMINE HCL 50 MG/ML IJ SOLN: 12.5000 mg | Freq: Four times a day (QID) | INTRAMUSCULAR | Status: DC | PRN

## 2022-10-24 MED ORDER — FENTANYL CITRATE PF 50 MCG/ML IJ SOSY
PREFILLED_SYRINGE | INTRAMUSCULAR | Status: AC
  Filled 2022-10-24: qty 1

## 2022-10-24 SURGICAL SUPPLY — 68 items
ADH SKN CLS APL DERMABOND .7 (GAUZE/BANDAGES/DRESSINGS) ×2
APL PRP STRL LF DISP 70% ISPRP (MISCELLANEOUS) ×2
APL SWBSTK 6 STRL LF DISP (MISCELLANEOUS) ×2
APPLICATOR COTTON TIP 6 STRL (MISCELLANEOUS) ×2 IMPLANT
APPLICATOR COTTON TIP 6IN STRL (MISCELLANEOUS) ×2
BAG COUNTER SPONGE SURGICOUNT (BAG) IMPLANT
BAG SPNG CNTER NS LX DISP (BAG)
CATH FOLEY 2WAY SLVR 18FR 30CC (CATHETERS) ×3 IMPLANT
CATH ROBINSON RED A/P 16FR (CATHETERS) ×2 IMPLANT
CATH ROBINSON RED A/P 8FR (CATHETERS) ×3 IMPLANT
CATH TIEMANN FOLEY 18FR 5CC (CATHETERS) ×2 IMPLANT
CHLORAPREP W/TINT 26 (MISCELLANEOUS) ×2 IMPLANT
CLIP LIGATING HEM O LOK PURPLE (MISCELLANEOUS) ×2 IMPLANT
COVER SURGICAL LIGHT HANDLE (MISCELLANEOUS) ×2 IMPLANT
COVER TIP SHEARS 8 DVNC (MISCELLANEOUS) ×2 IMPLANT
CUTTER ECHEON FLEX ENDO 45 340 (ENDOMECHANICALS) ×2 IMPLANT
DERMABOND ADVANCED .7 DNX12 (GAUZE/BANDAGES/DRESSINGS) ×2 IMPLANT
DRAIN CHANNEL RND F F (WOUND CARE) IMPLANT
DRAPE ARM DVNC X/XI (DISPOSABLE) ×8 IMPLANT
DRAPE COLUMN DVNC XI (DISPOSABLE) ×2 IMPLANT
DRAPE SURG IRRIG POUCH 19X23 (DRAPES) ×2 IMPLANT
DRIVER NDL LRG 8 DVNC XI (INSTRUMENTS) ×4 IMPLANT
DRIVER NDLE LRG 8 DVNC XI (INSTRUMENTS) ×4 IMPLANT
DRSG TEGADERM 4X4.75 (GAUZE/BANDAGES/DRESSINGS) ×2 IMPLANT
ELECT PENCIL ROCKER SW 15FT (MISCELLANEOUS) ×3 IMPLANT
ELECT REM PT RETURN 15FT ADLT (MISCELLANEOUS) ×2 IMPLANT
FORCEPS BPLR LNG DVNC XI (INSTRUMENTS) ×3 IMPLANT
FORCEPS PROGRASP DVNC XI (FORCEP) ×3 IMPLANT
GAUZE 4X4 16PLY ~~LOC~~+RFID DBL (SPONGE) ×2 IMPLANT
GAUZE SPONGE 4X4 12PLY STRL (GAUZE/BANDAGES/DRESSINGS) ×2 IMPLANT
GLOVE BIO SURGEON STRL SZ 6.5 (GLOVE) ×3 IMPLANT
GLOVE SURG LX STRL 7.5 STRW (GLOVE) ×6 IMPLANT
GOWN SRG XL LVL 4 BRTHBL STRL (GOWNS) ×2 IMPLANT
GOWN STRL NON-REIN XL LVL4 (GOWNS) ×2
GOWN STRL REUS W/ TWL XL LVL3 (GOWN DISPOSABLE) ×4 IMPLANT
GOWN STRL REUS W/TWL XL LVL3 (GOWN DISPOSABLE) ×4
HOLDER FOLEY CATH W/STRAP (MISCELLANEOUS) ×2 IMPLANT
IRRIG SUCT STRYKERFLOW 2 WTIP (MISCELLANEOUS) ×2
IRRIGATION SUCT STRKRFLW 2 WTP (MISCELLANEOUS) ×2 IMPLANT
IV LACTATED RINGERS 1000ML (IV SOLUTION) ×2 IMPLANT
KIT TURNOVER KIT A (KITS) IMPLANT
NDL SAFETY ECLIP 18X1.5 (MISCELLANEOUS) ×3 IMPLANT
PACK ROBOT UROLOGY CUSTOM (CUSTOM PROCEDURE TRAY) ×2 IMPLANT
RELOAD STAPLE 45 4.1 GRN THCK (STAPLE) ×2 IMPLANT
SCISSORS MNPLR CVD DVNC XI (INSTRUMENTS) ×2 IMPLANT
SEAL UNIV 5-12 XI (MISCELLANEOUS) ×9 IMPLANT
SET CYSTO W/LG BORE CLAMP LF (SET/KITS/TRAYS/PACK) IMPLANT
SET TUBE SMOKE EVAC HIGH FLOW (TUBING) ×2 IMPLANT
SOL ELECTROSURG ANTI STICK (MISCELLANEOUS) ×2
SOLUTION ELECTROSURG ANTI STCK (MISCELLANEOUS) ×2 IMPLANT
SPIKE FLUID TRANSFER (MISCELLANEOUS) ×3 IMPLANT
STAPLE RELOAD 45 GRN (STAPLE) ×2 IMPLANT
STAPLE RELOAD 45MM GREEN (STAPLE) ×2
STAPLER POWER ECHELON 45 WIDE (STAPLE) IMPLANT
SUT ETHILON 3 0 PS 1 (SUTURE) ×3 IMPLANT
SUT MNCRL 3 0 RB1 (SUTURE) ×2 IMPLANT
SUT MNCRL 3 0 VIOLET RB1 (SUTURE) ×2 IMPLANT
SUT MNCRL AB 4-0 PS2 18 (SUTURE) ×4 IMPLANT
SUT PDS PLUS AB 0 CT-2 (SUTURE) ×6 IMPLANT
SUT VIC AB 0 CT1 27 (SUTURE) ×4
SUT VIC AB 0 CT1 27XBRD ANTBC (SUTURE) ×6 IMPLANT
SUT VIC AB 2-0 SH 27 (SUTURE) ×2
SUT VIC AB 2-0 SH 27X BRD (SUTURE) ×2 IMPLANT
SYR 27GX1/2 1ML LL SAFETY (SYRINGE) ×2 IMPLANT
TOWEL OR NON WOVEN STRL DISP B (DISPOSABLE) ×3 IMPLANT
TROCAR Z THREAD OPTICAL 12X100 (TROCAR) IMPLANT
TROCAR Z-THREAD FIOS 5X100MM (TROCAR) IMPLANT
WATER STERILE IRR 1000ML POUR (IV SOLUTION) ×2 IMPLANT

## 2022-10-24 NOTE — Progress Notes (Signed)
Patient ID: Caleb Obrien, male   DOB: 04-Oct-1965, 57 y.o.   MRN: [id number]  Post-op note  Subjective: The patient is doing well.  Complains of bladder spasms.  Bladder scan 3 cc.  Objective: Vital signs in last 24 hours: Temp:  [98 F (36.7 C)-98.6 F (37 C)] 98 F (36.7 C) (05/13 1405) Pulse Rate:  [82-100] 100 (05/13 1515) Resp:  [12-19] 14 (05/13 1515) BP: (126-164)/(80-93) 164/81 (05/13 1515) SpO2:  [93 %-100 %] 98 % (05/13 1515) Weight:  [77.1 kg] 77.1 kg (05/13 0956)  Intake/Output from previous day: No intake/output data recorded. Intake/Output this shift: Total I/O In: 1800 [I.V.:1800] Out: 135 [Blood:135]  Physical Exam:  General: Alert and oriented. Abdomen: Soft, Nondistended. Incisions: Clean and dry. GU: Urine clear.  Lab Results: Recent Labs    10/24/22 1445  HGB 12.3*  HCT 36.0*    Assessment/Plan: POD#0   1) Continue to monitor, ambulate, IS, Levsin for bladder spasms   [name]. MD   LOS: 0 days   Crecencio Mc 10/24/2022, 3:39 PM

## 2022-10-24 NOTE — Discharge Instructions (Signed)

## 2022-10-24 NOTE — Interval H&P Note (Signed)
History and Physical Interval Note:  10/24/2022 10:04 AM  Caleb Obrien  has presented today for surgery, with the diagnosis of PROSTATE CANCER.  The various methods of treatment have been discussed with the patient and family. After consideration of risks, benefits and other options for treatment, the patient has consented to  Procedure(s) with comments: XI ROBOTIC ASSISTED LAPAROSCOPIC RADICAL PROSTATECTOMY LEVEL 2 (N/A) - 210 MINUTES NEEDED FOR CASE BILATERAL PELVIC LYMPHADENECTOMY (Bilateral) as a surgical intervention.  The patient's history has been reviewed, patient examined, no change in status, stable for surgery.  I have reviewed the patient's chart and labs.  Questions were answered to the patient's satisfaction.     [employer]

## 2022-10-24 NOTE — Transfer of Care (Signed)
Immediate Anesthesia Transfer of Care Note  Patient: Caleb Obrien  Procedure(s) Performed: XI ROBOTIC ASSISTED LAPAROSCOPIC RADICAL PROSTATECTOMY LEVEL 2 BILATERAL PELVIC LYMPHADENECTOMY (Bilateral)  Patient Location: PACU  Anesthesia Type:General  Level of Consciousness: drowsy  Airway & Oxygen Therapy: Patient Spontanous Breathing and Patient connected to face mask oxygen  Post-op Assessment: Report given to RN and Post -op Vital signs reviewed and stable  Post vital signs: Reviewed and stable  Last Vitals:  Vitals Value Taken Time  BP 131/87 10/24/22 1405  Temp    Pulse 93 10/24/22 1407  Resp 15 10/24/22 1407  SpO2 100 % 10/24/22 1407  Vitals shown include unvalidated device data.  Last Pain:  Vitals:   10/24/22 0956  TempSrc:   PainSc: 0-No pain         Complications: No notable events documented.

## 2022-10-24 NOTE — Anesthesia Procedure Notes (Addendum)
Procedure Name: Intubation Date/Time: 10/24/2022 10:59 AM  Performed by: Florene Route, CRNAPre-anesthesia Checklist: Patient identified, Emergency Drugs available, Suction available and Patient being monitored Patient Re-evaluated:Patient Re-evaluated prior to induction Oxygen Delivery Method: Circle system utilized Preoxygenation: Pre-oxygenation with 100% oxygen Induction Type: IV induction Ventilation: Mask ventilation without difficulty and Oral airway inserted - appropriate to patient size Laryngoscope Size: Miller and 3 Grade View: Grade I Tube type: Oral Tube size: 8.0 mm Number of attempts: 1 Airway Equipment and Method: Stylet Placement Confirmation: ETT inserted through vocal cords under direct vision, positive ETCO2 and breath sounds checked- equal and bilateral Secured at: 22 cm Tube secured with: Tape Dental Injury: Teeth and Oropharynx as per pre-operative assessment

## 2022-10-24 NOTE — Anesthesia Preprocedure Evaluation (Addendum)
Anesthesia Evaluation  Patient identified by MRN, date of birth, ID band Patient awake    Reviewed: Allergy & Precautions, NPO status , Patient's Chart, lab work & pertinent test results  Airway Mallampati: III  TM Distance: >3 FB Neck ROM: Full    Dental no notable dental hx.    Pulmonary asthma , sleep apnea and Continuous Positive Airway Pressure Ventilation , COPD,  COPD inhaler, former smoker   Pulmonary exam normal        Cardiovascular hypertension, Pt. on home beta blockers Normal cardiovascular exam     Neuro/Psych  PSYCHIATRIC DISORDERS Anxiety      Neuromuscular disease    GI/Hepatic ,GERD  Medicated and Controlled,,(+)     substance abuse    Endo/Other  negative endocrine ROS    Renal/GU negative Renal ROS     Musculoskeletal negative musculoskeletal ROS (+)    Abdominal   Peds  Hematology negative hematology ROS (+)   Anesthesia Other Findings PROSTATE CANCER  Reproductive/Obstetrics                              Anesthesia Physical Anesthesia Plan  ASA: 2  Anesthesia Plan: General   Post-op Pain Management:    Induction: Intravenous  PONV Risk Score and Plan: 3 and Ondansetron, Dexamethasone, Midazolam and Treatment may vary due to age or medical condition  Airway Management Planned: Oral ETT  Additional Equipment:   Intra-op Plan:   Post-operative Plan: Extubation in OR  Informed Consent: I have reviewed the patients History and Physical, chart, labs and discussed the procedure including the risks, benefits and alternatives for the proposed anesthesia with the patient or authorized representative who has indicated his/her understanding and acceptance.     Dental advisory given  Plan Discussed with: CRNA  Anesthesia Plan Comments:         Anesthesia Quick Evaluation

## 2022-10-24 NOTE — Op Note (Signed)
Preoperative diagnosis: Clinically localized adenocarcinoma of the prostate (clinical stage T1c N0 M0)  Postoperative diagnosis: Clinically localized adenocarcinoma of the prostate (clinical stage T1c N0 M0)  Procedure:  Robotic assisted laparoscopic radical prostatectomy (bilateral nerve sparing) Bilateral robotic assisted laparoscopic pelvic lymphadenectomy  Surgeon: [name]. M.D.  Assistant: Harrie Foreman, PA-C  An assistant was required for this surgical procedure.  The duties of the assistant included but were not limited to suctioning, passing suture, camera manipulation, retraction. This procedure would not be able to be performed without an Geophysicist/field seismologist.  Anesthesia: General  Complications: None  EBL: 135 mL  IVF:  1500 mL crystalloid  Specimens: Prostate and seminal vesicles Right pelvic lymph nodes Left pelvic lymph nodes  Disposition of specimens: Pathology  Drains: 20 Fr coude catheter # 19 Blake pelvic drain  Indication: Caleb Obrien is a 57 y.o. year old patient with clinically localized prostate cancer.  After a thorough review of the management options for treatment of prostate cancer, he elected to proceed with surgical therapy and the above procedure(s).  We have discussed the potential benefits and risks of the procedure, side effects of the proposed treatment, the likelihood of the patient achieving the goals of the procedure, and any potential problems that might occur during the procedure or recuperation. Informed consent has been obtained.  Description of procedure:  The patient was taken to the operating room and a general anesthetic was administered. He was given preoperative antibiotics, placed in the dorsal lithotomy position, and prepped and draped in the usual sterile fashion. Next a preoperative timeout was performed. A urethral catheter was placed into the bladder and a site was selected near the umbilicus for placement of the camera  port. This was placed using a standard open Hassan technique which allowed entry into the peritoneal cavity under direct vision and without difficulty. An 8 mm robotic port was placed and a pneumoperitoneum established. The camera was then used to inspect the abdomen and there was no evidence of any intra-abdominal injuries or other abnormalities. The remaining abdominal ports were then placed. 8 mm robotic ports were placed in the right lower quadrant, left lower quadrant, and far left lateral abdominal wall. A 5 mm port was placed in the right upper quadrant and a 12 mm port was placed in the right lateral abdominal wall for laparoscopic assistance. All ports were placed under direct vision without difficulty. The surgical cart was then docked.   Utilizing the cautery scissors, the bladder was reflected posteriorly allowing entry into the space of Retzius and identification of the endopelvic fascia and prostate. The periprostatic fat was then removed from the prostate allowing full exposure of the endopelvic fascia. The endopelvic fascia was then incised from the apex back to the base of the prostate bilaterally and the underlying levator muscle fibers were swept laterally off the prostate thereby isolating the dorsal venous complex. The dorsal vein was then stapled and divided with a 45 mm Flex Echelon stapler. Attention then turned to the bladder neck which was divided anteriorly thereby allowing entry into the bladder and exposure of the urethral catheter. The catheter balloon was deflated and the catheter was brought into the operative field and used to retract the prostate anteriorly. The posterior bladder neck was then examined and was divided allowing further dissection between the bladder and prostate posteriorly until the vasa deferentia and seminal vessels were identified. The vasa deferentia were isolated, divided, and lifted anteriorly. The seminal vesicles were dissected down to  their tips with care  to control the seminal vascular arterial blood supply. These structures were then lifted anteriorly and the space between Denonvillier's fascia and the anterior rectum was developed with a combination of sharp and blunt dissection. This isolated the vascular pedicles of the prostate.  The lateral prostatic fascia was then sharply incised allowing release of the neurovascular bundles bilaterally. The vascular pedicles of the prostate were then ligated with Weck clips between the prostate and neurovascular bundles and divided with sharp cold scissor dissection resulting in neurovascular bundle preservation. The neurovascular bundles were then separated off the apex of the prostate and urethra bilaterally.  The urethra was then sharply transected allowing the prostate specimen to be disarticulated. The pelvis was copiously irrigated and hemostasis was ensured. There was no evidence for rectal injury.  Attention then turned to the right pelvic sidewall. The fibrofatty tissue between the external iliac vein, confluence of the iliac vessels, hypogastric artery, and Cooper's ligament was dissected free from the pelvic sidewall with care to preserve the obturator nerve. Weck clips were used for lymphostasis and hemostasis. An identical procedure was performed on the contralateral side and the lymphatic packets were removed for permanent pathologic analysis.  Attention then turned to the urethral anastomosis. A 2-0 Vicryl slip knot was placed between Denonvillier's fascia, the posterior bladder neck, and the posterior urethra to reapproximate these structures. A double-armed 3-0 Monocryl suture was then used to perform a 360 running tension-free anastomosis between the bladder neck and urethra. A new urethral catheter was then placed into the bladder and irrigated. There were no blood clots within the bladder and the anastomosis appeared to be watertight. A #19 Blake drain was then brought through the left lateral 8  mm port site and positioned appropriately within the pelvis. It was secured to the skin with a nylon suture. The surgical cart was then undocked. The right lateral 12 mm port site was closed at the fascial level with a 0 Vicryl suture placed laparoscopically. All remaining ports were then removed under direct vision. The prostate specimen was removed intact within the Endopouch retrieval bag via the periumbilical camera port site. This fascial opening was closed with two running 0 PDS sutures. 0.25% Marcaine was then injected into all port sites and all incisions were reapproximated at the skin level with 4-0 Monocryl subcuticular sutures and Dermabond. The patient appeared to tolerate the procedure well and without complications. The patient was able to be extubated and transferred to the recovery unit in satisfactory condition.   [name] MD

## 2022-10-25 ENCOUNTER — Encounter (HOSPITAL_COMMUNITY): Payer: Self-pay | Admitting: Urology

## 2022-10-25 DIAGNOSIS — I10 Essential (primary) hypertension: Secondary | ICD-10-CM | POA: Diagnosis not present

## 2022-10-25 DIAGNOSIS — J45909 Unspecified asthma, uncomplicated: Secondary | ICD-10-CM | POA: Diagnosis not present

## 2022-10-25 DIAGNOSIS — Z79899 Other long term (current) drug therapy: Secondary | ICD-10-CM | POA: Diagnosis not present

## 2022-10-25 DIAGNOSIS — C61 Malignant neoplasm of prostate: Secondary | ICD-10-CM | POA: Diagnosis not present

## 2022-10-25 DIAGNOSIS — Z7982 Long term (current) use of aspirin: Secondary | ICD-10-CM | POA: Diagnosis not present

## 2022-10-25 LAB — POCT I-STAT, CHEM 8
BUN: 16 mg/dL (ref 6–20)
Calcium, Ion: 1.17 mmol/L (ref 1.15–1.40)
Chloride: 97 mmol/L — ABNORMAL LOW (ref 98–111)
Creatinine, Ser: 1 mg/dL (ref 0.61–1.24)
Glucose, Bld: 117 mg/dL — ABNORMAL HIGH (ref 70–99)
HCT: 39 % (ref 39.0–52.0)
Hemoglobin: 13.3 g/dL (ref 13.0–17.0)
Potassium: 3.3 mmol/L — ABNORMAL LOW (ref 3.5–5.1)
Sodium: 135 mmol/L (ref 135–145)
TCO2: 28 mmol/L (ref 22–32)

## 2022-10-25 LAB — HEMOGLOBIN AND HEMATOCRIT, BLOOD
HCT: 30.6 % — ABNORMAL LOW (ref 39.0–52.0)
Hemoglobin: 10.7 g/dL — ABNORMAL LOW (ref 13.0–17.0)

## 2022-10-25 MED ORDER — CHLORHEXIDINE GLUCONATE CLOTH 2 % EX PADS
6.0000 | MEDICATED_PAD | Freq: Every day | CUTANEOUS | Status: DC
  Administered 2022-10-25: 6 via TOPICAL

## 2022-10-25 MED ORDER — BISACODYL 10 MG RE SUPP
10.0000 mg | Freq: Once | RECTAL | Status: AC
  Administered 2022-10-25: 10 mg via RECTAL
  Filled 2022-10-25: qty 1

## 2022-10-25 MED ORDER — TRAMADOL HCL 50 MG PO TABS: 50.0000 mg | ORAL_TABLET | Freq: Four times a day (QID) | ORAL | Status: DC | PRN

## 2022-10-25 NOTE — Progress Notes (Signed)
  Transition of Care [facility]) Screening Note   Patient Details  Name: AIKAM ELBAZ Date of Birth: [birth year]   [facility] of [facility]) CM/SW Contact:    Lanier Clam, RN Phone Number: 10/25/2022, 11:40 AM    Transition of Care Department [facility]) has reviewed patient and no TOC needs have been identified at this time. We will continue to monitor patient advancement through interdisciplinary progression rounds. If new patient transition needs arise, please place a TOC consult.

## 2022-10-25 NOTE — Progress Notes (Signed)
Patient ID: Caleb Obrien, male   DOB: 03-15-1966, 57 y.o.   MRN: [id number]  1 Day Post-Op Subjective: The patient is doing well.  No nausea or vomiting. Pain is adequately controlled.  Objective: Vital signs in last 24 hours: Temp:  [98 F (36.7 C)-99 F (37.2 C)] 98.5 F (36.9 C) (05/14 0520) Pulse Rate:  [82-102] 93 (05/14 0520) Resp:  [10-19] 19 (05/14 0520) BP: (100-164)/(71-93) 100/71 (05/14 0520) SpO2:  [93 %-100 %] 98 % (05/14 0520) Weight:  [77.1 kg] 77.1 kg (05/13 0956)  Intake/Output from previous day: 05/13 0701 - 05/14 0700 In: 3171.3 [I.V.:3071.3; IV Piggyback:100] Out: 2005 [Urine:1690; Drains:180; Blood:135] Intake/Output this shift: No intake/output data recorded.  Physical Exam:  General: Alert and oriented. CV: RRR Lungs: Clear bilaterally. GI: Soft, Nondistended. Incisions: Clean, dry, and intact Urine: Clear Extremities: Nontender, no erythema, no edema.  Lab Results: Recent Labs    10/24/22 1126 10/24/22 1445 10/25/22 0526  HGB 13.3 12.3* 10.7*  HCT 39.0 36.0* 30.6*      Assessment/Plan: POD# 1 s/p robotic prostatectomy.  1) SL IVF 2) Ambulate, Incentive spirometry 3) Transition to oral pain medication 4) Dulcolax suppository 5) D/C pelvic drain 6) Plan for likely discharge later today   Caleb Obrien. MD   LOS: 0 days   Caleb Obrien 10/25/2022, 7:23 AM

## 2022-10-25 NOTE — Discharge Summary (Signed)
Date of admission: 10/24/2022  Date of discharge: 10/25/2022  Admission diagnosis: Prostate Cancer  Discharge diagnosis: Prostate Cancer  History and Physical: For full details, please see admission history and physical. Briefly, Caleb Obrien is a 57 y.o. gentleman with localized prostate cancer.  After discussing management/treatment options, he elected to proceed with surgical treatment.  Hospital Course: Caleb Obrien was taken to the operating room on 10/24/2022 and underwent a robotic assisted laparoscopic radical prostatectomy. He tolerated this procedure well and without complications. Postoperatively, he was able to be transferred to a regular hospital room following recovery from anesthesia.  He was able to begin ambulating the night of surgery. He remained hemodynamically stable overnight.  He had excellent urine output with appropriately minimal output from his pelvic drain and his pelvic drain was removed on POD #1.  He was transitioned to oral pain medication, tolerated a clear liquid diet, and had met all discharge criteria and was able to be discharged home later on POD#1.  Laboratory values:  Recent Labs    10/24/22 1126 10/24/22 1445 10/25/22 0526  HGB 13.3 12.3* 10.7*  HCT 39.0 36.0* 30.6*    Disposition: Home  Discharge instruction: He was instructed to be ambulatory but to refrain from heavy lifting, strenuous activity, or driving. He was instructed on urethral catheter care.  Discharge medications:   Allergies as of 10/25/2022       Reactions   Amlodipine    cough   Lisinopril    cough   Sudafed [pseudoephedrine Hcl] Other (See Comments)   prostatism        Medication List     STOP taking these medications    aspirin EC 81 MG tablet   Cholecalciferol 50 MCG (2000 UT) Tabs       TAKE these medications    Airsupra 90-80 MCG/ACT Aero Generic drug: Albuterol-Budesonide Inhale 2 puffs into the lungs 4 (four) times daily. What changed:   when to take this reasons to take this   ALPRAZolam 0.25 MG tablet Commonly known as: XANAX TAKE 1/2 TABLET BY MOUTH DAILY AS NEEDED   atorvastatin 40 MG tablet Commonly known as: LIPITOR Take 1 tablet (40 mg total) by mouth daily.   buPROPion ER 100 MG 12 hr tablet Commonly known as: WELLBUTRIN SR Take 1 tablet (100 mg total) by mouth 2 (two) times daily.   carvedilol 3.125 MG tablet Commonly known as: COREG TAKE 1 TABLET BY MOUTH TWICE A DAY WITH A MEAL   dexlansoprazole 60 MG capsule Commonly known as: DEXILANT TAKE 1 CAPSULE BY MOUTH EVERY DAY   docusate sodium 100 MG capsule Commonly known as: COLACE Take 1 capsule (100 mg total) by mouth 2 (two) times daily.   emtricitabine-tenofovir 200-300 MG tablet Commonly known as: TRUVADA Take 1 tablet by mouth once daily   indapamide 1.25 MG tablet Commonly known as: LOZOL Take 1 tablet (1.25 mg total) by mouth daily.   irbesartan 300 MG tablet Commonly known as: AVAPRO TAKE 1 TABLET BY MOUTH DAILY   loratadine 10 MG tablet Commonly known as: CLARITIN Take 10 mg by mouth daily as needed for allergies.   sulfamethoxazole-trimethoprim 800-160 MG tablet Commonly known as: BACTRIM DS Take 1 tablet by mouth 2 (two) times daily. Start the day prior to foley removal appointment   tadalafil 20 MG tablet Commonly known as: CIALIS TAKE ONE TABLET BY MOUTH DAILY AS NEEDED FOR ERECTILE DYSFUNCTION   traMADol 50 MG tablet Commonly known as: Ultram Take 1-2 tablets (  50-100 mg total) by mouth every 6 (six) hours as needed for moderate pain or severe pain.        Followup: He will followup in 1 week for catheter removal and to discuss his surgical pathology results.

## 2022-10-25 NOTE — Anesthesia Postprocedure Evaluation (Signed)
Anesthesia Post Note  Patient: Ceasar Lund  Procedure(s) Performed: XI ROBOTIC ASSISTED LAPAROSCOPIC RADICAL PROSTATECTOMY LEVEL 2 BILATERAL PELVIC LYMPHADENECTOMY (Bilateral)     Patient location during evaluation: PACU Anesthesia Type: General Level of consciousness: awake Pain management: pain level controlled Vital Signs Assessment: post-procedure vital signs reviewed and stable Respiratory status: spontaneous breathing, nonlabored ventilation and respiratory function stable Cardiovascular status: blood pressure returned to baseline and stable Postop Assessment: no apparent nausea or vomiting Anesthetic complications: no   No notable events documented.  Last Vitals:  Vitals:   10/25/22 0054 10/25/22 0520  BP: 125/74 100/71  Pulse: (!) 102 93  Resp: 19 19  Temp: 36.8 C 36.9 C  SpO2: 97% 98%    Last Pain:  Vitals:   10/25/22 0520  TempSrc: Oral  PainSc:                  Catheryn Bacon Vonetta Foulk

## 2022-10-29 LAB — SURGICAL PATHOLOGY

## 2022-11-28 DIAGNOSIS — M6281 Muscle weakness (generalized): Secondary | ICD-10-CM | POA: Diagnosis not present

## 2022-11-28 DIAGNOSIS — M62838 Other muscle spasm: Secondary | ICD-10-CM | POA: Diagnosis not present

## 2022-11-28 DIAGNOSIS — N393 Stress incontinence (female) (male): Secondary | ICD-10-CM | POA: Diagnosis not present

## 2022-11-28 DIAGNOSIS — R8271 Bacteriuria: Secondary | ICD-10-CM | POA: Diagnosis not present

## 2022-12-08 DIAGNOSIS — N393 Stress incontinence (female) (male): Secondary | ICD-10-CM | POA: Diagnosis not present

## 2022-12-08 DIAGNOSIS — M6281 Muscle weakness (generalized): Secondary | ICD-10-CM | POA: Diagnosis not present

## 2022-12-08 DIAGNOSIS — M62838 Other muscle spasm: Secondary | ICD-10-CM | POA: Diagnosis not present

## 2022-12-31 ENCOUNTER — Other Ambulatory Visit: Payer: Self-pay | Admitting: Internal Medicine

## 2022-12-31 DIAGNOSIS — F411 Generalized anxiety disorder: Secondary | ICD-10-CM

## 2022-12-31 DIAGNOSIS — I10 Essential (primary) hypertension: Secondary | ICD-10-CM

## 2023-01-18 ENCOUNTER — Encounter: Payer: Self-pay | Admitting: Internal Medicine

## 2023-01-18 ENCOUNTER — Ambulatory Visit: Payer: BC Managed Care – PPO | Attending: Internal Medicine | Admitting: Internal Medicine

## 2023-01-18 VITALS — BP 120/80 | HR 92 | Resp 14 | Ht 66.0 in | Wt 170.0 lb

## 2023-01-18 DIAGNOSIS — G63 Polyneuropathy in diseases classified elsewhere: Secondary | ICD-10-CM

## 2023-01-18 DIAGNOSIS — E538 Deficiency of other specified B group vitamins: Secondary | ICD-10-CM

## 2023-01-18 DIAGNOSIS — C61 Malignant neoplasm of prostate: Secondary | ICD-10-CM | POA: Diagnosis not present

## 2023-01-18 DIAGNOSIS — R200 Anesthesia of skin: Secondary | ICD-10-CM | POA: Diagnosis not present

## 2023-01-18 LAB — PSA: PSA: 0

## 2023-01-18 NOTE — Progress Notes (Signed)
Office Visit Note  Patient: Caleb Obrien             Date of Birth: 09-Feb-1966           MRN: [id number]             PCP: Etta Grandchild, MD Referring: Etta Grandchild, MD Visit Date: 01/18/2023  Subjective:  New Patient (Initial Visit) (Patient states he has numbness in his second and third digits on his right hand. Patient states sometimes his toes get cold. )   History of Present Illness: [name] is a 57 y.o. male here for evaluation of intermittent numbness and discoloration affecting the fingers of his right hand that has been ongoing for several years and somewhat worse since last winter.  He sees visible discoloration in the second and third finger of his right hand sometimes the whole length of the finger or just the tip of it.  There is associated numbness.  He notices it happen usually when he is walking at a desk for prolonged time where he is usually resting his arm on the surface.  Symptoms resolved within less than 30 minutes never has any residual skin peeling ulcers or digital pitting.  Does not experience any discoloration in his left hand or in his toes.  Does not have any joint pain numbness or weakness in this area outside of the specific episodes.  He has a history of former smoking history of hypertension and hyperlipidemia.  He has been diagnosed with associated emphysema does not have any known peripheral vascular disease.  Denies any skin rashes, new hair loss, lymphadenopathy, oral nasal ulcers, or abnormal bruising or bleeding.  Activities of Daily Living:  Patient reports morning stiffness for 0 minute.   Patient Denies nocturnal pain.  Difficulty dressing/grooming: Denies Difficulty climbing stairs: Denies Difficulty getting out of chair: Denies Difficulty using hands for taps, buttons, cutlery, and/or writing: Denies  Review of Systems  Constitutional:  Negative for fatigue.  HENT:  Negative for mouth sores and mouth dryness.   Eyes:   Negative for dryness.  Respiratory:  Negative for shortness of breath.   Cardiovascular:  Negative for chest pain and palpitations.  Gastrointestinal:  Negative for blood in stool, constipation and diarrhea.  Endocrine: Negative for increased urination.  Genitourinary:  Positive for involuntary urination.  Musculoskeletal:  Negative for joint pain, gait problem, joint pain, joint swelling, myalgias, muscle weakness, morning stiffness, muscle tenderness and myalgias.  Skin:  Negative for color change, rash, hair loss and sensitivity to sunlight.  Allergic/Immunologic: Negative for susceptible to infections.  Neurological:  Negative for dizziness and headaches.  Hematological:  Negative for swollen glands.  Psychiatric/Behavioral:  Negative for depressed mood and sleep disturbance. The patient is not nervous/anxious.     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   GERD with apnea 04/26/2016   High risk homosexual behavior 04/12/2016   Hyperlipidemia with target LDL less than 130 04/28/2015   Drug-induced erectile dysfunction 04/27/2015   Essential hypertension, benign 02/08/2014   Mild persistent asthma 02/08/2014   OSA (obstructive  sleep apnea) 07/12/2013   GAD (generalized anxiety disorder) 02/05/2007    Past Medical History:  Diagnosis Date   ALLERGIC RHINITIS CAUSE UNSPECIFIED 02/24/2009   Allergy    ASTHMA 02/05/2007   BENIGN PROSTATIC HYPERTROPHY, WITH URINARY OBSTRUCTION 05/22/2007   Cancer (HCC)    Prostate   Dermatophytosis of foot  11/02/2009   Emphysema of lung (HCC)    Very early   GANGLION CYST, WRIST, LEFT 03/25/2009   Hemangioma of skin and subcutaneous tissue 06/30/2009   HEPATITIS B, HX OF 02/05/2007   per pt no history   HYPERLIPIDEMIA, BORDERLINE 04/21/2008   Hypertension    PLANTAR WART, RIGHT 01/23/2009   Sleep apnea    uses c-pap    Family History  Problem Relation Age of Onset   Hypertension Mother    Arthritis Mother    Prostate cancer Father    Healthy Sister    Colon cancer Neg Hx    Esophageal cancer Neg Hx    Stomach cancer Neg Hx    Rectal cancer Neg Hx    Past Surgical History:  Procedure Laterality Date   eyelid surgery  04/11/2018   Bil   LYMPHADENECTOMY Bilateral 10/24/2022   Procedure: BILATERAL PELVIC LYMPHADENECTOMY;  Surgeon: Heloise Purpura, MD;  Location: [facility];  Service: Urology;  Laterality: Bilateral;   ROBOT ASSISTED LAPAROSCOPIC RADICAL PROSTATECTOMY N/A 10/24/2022   Procedure: XI ROBOTIC ASSISTED LAPAROSCOPIC RADICAL PROSTATECTOMY LEVEL 2;  Surgeon: Heloise Purpura, MD;  Location: [facility];  Service: Urology;  Laterality: N/A;  210 MINUTES NEEDED FOR CASE   WISDOM TOOTH EXTRACTION     Social History   Social History Narrative   Not on file   Immunization History  Administered Date(s) Administered   HPV 9-valent 11/08/2018, 12/06/2018, 06/03/2019   Hepatitis B, ADULT 05/24/2016, 06/24/2016, 10/06/2016   Influenza Split 05/24/2011, 03/20/2012   Influenza Whole 03/29/2007, 03/27/2008, 03/25/2009   Influenza,inj,Quad PF,6+ Mos 05/22/2013, 02/07/2014, 04/27/2015, 04/12/2017   Influenza-Unspecified 03/13/2020, 04/06/2021, 03/11/2022   PFIZER(Purple Top)SARS-COV-2 Vaccination 08/17/2019, 09/07/2019, 03/29/2020, 03/14/2022   PNEUMOCOCCAL CONJUGATE-20 03/29/2022   Td 11/17/2006   Tdap 01/05/2017   Zoster Recombinant(Shingrix) 08/05/2020, 12/30/2020     Objective: Vital Signs: BP 120/80 (BP Location: Right Arm, Patient Position: Sitting, Cuff Size: Normal)   Pulse 92    Resp 14   Ht 5\' 6"  (1.676 m)   Wt 170 lb (77.1 kg)   BMI 27.44 kg/m    Physical Exam Eyes:     Conjunctiva/sclera: Conjunctivae normal.  Cardiovascular:     Rate and Rhythm: Normal rate and regular rhythm.     Comments: Negative modified Allen's exam Pulmonary:     Effort: Pulmonary effort is normal.     Breath sounds: Normal breath sounds.  Lymphadenopathy:     Cervical: No cervical adenopathy.  Skin:    General: Skin is warm and dry.     Comments: No digital pitting Normal-appearing nailfold capillaroscopy  Neurological:     Mental Status: He is alert.  Psychiatric:        Mood and Affect: Mood normal.      Musculoskeletal Exam:  Shoulders full ROM no tenderness or swelling Elbows full ROM no tenderness or swelling Wrists full ROM no tenderness or swelling Fingers full ROM no tenderness or swelling Knees full ROM no tenderness or swelling Ankles full ROM no tenderness or swelling MTPs full ROM no tenderness or swelling  Investigation: No additional findings.  Imaging: No results found.  Recent Labs: Lab Results  Component  Value Date   WBC 5.9 10/05/2022   HGB 10.7 (L) 10/25/2022   PLT 302.0 10/05/2022   NA 135 10/24/2022   K 3.3 (L) 10/24/2022   CL 97 (L) 10/24/2022   CO2 26 10/24/2022   GLUCOSE 117 (H) 10/24/2022   BUN 16 10/24/2022   CREATININE 1.00 10/24/2022   BILITOT 1.0 06/09/2022   ALKPHOS 85 06/09/2022   AST 26 06/09/2022   ALT 38 06/09/2022   PROT 7.0 10/05/2022   ALBUMIN 4.4 06/09/2022   CALCIUM 8.5 (L) 10/24/2022   GFRAA 95 01/10/2008    Speciality Comments: No specialty comments available.  Procedures:  No procedures performed Allergies: Amlodipine, Lisinopril, and Sudafed [pseudoephedrine hcl]   Assessment / Plan:     Visit Diagnoses: Numbness of fingers  Does not appear to be any major structural changes with normal nailfold capillaries no digital changes and symptoms provoked more by compression it sounds like versus  temperature exposure.  Cannot entirely exclude some mild peripheral arterial disease but I think this is more related to just compressive symptoms.  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  If symptoms get more severe could try addition of calcium channel blocker but for now I think he is okay to monitor and recommended using some type of additional padding or offloading pressure on the affected area.  Prostate cancer (HCC)  Treated by local resection he has follow-up appointment in urology for PSA monitoring.  Was not on any systemic chemotherapy and had no evidence of extensive disease on PET scan that be associated with peripheral symptoms now.  Vitamin B12 deficiency neuropathy (HCC)  With the limited distribution also do not think this is very suggestive for systemic peripheral neuropathy.  Orders: No orders of the defined types were placed in this encounter.  No orders of the defined types were placed in this encounter.    Follow-Up Instructions: Return if symptoms worsen or fail to improve.   Fuller Plan, MD  Note - This record has been created using [employer].  Chart creation errors have been sought, but may not always  have been located. Such creation errors do not reflect on  the standard of medical care.

## 2023-01-18 NOTE — Patient Instructions (Signed)
I do not see any red flags for severe circulation problems. Signs to watch for would be skin damage or changes after episodes or numbness that does not recover. We could follow up just as needed.  There may be mild impairment of circulation related to issues such as age, smoking, cholesterol, or hypertension but I don't think anything severe enough to warrant testing or additional treatment at this time.  I think some is coming from compression of the nerve and/or arteries in the forearm. I attached some range of motion exercises that can help mobilize these structures and the overlying muscles. You can also try using a pad on surfaces if needing to rest your arm for prolonged work.

## 2023-01-19 DIAGNOSIS — G4733 Obstructive sleep apnea (adult) (pediatric): Secondary | ICD-10-CM | POA: Diagnosis not present

## 2023-01-25 DIAGNOSIS — C61 Malignant neoplasm of prostate: Secondary | ICD-10-CM | POA: Diagnosis not present

## 2023-01-25 DIAGNOSIS — N393 Stress incontinence (female) (male): Secondary | ICD-10-CM | POA: Diagnosis not present

## 2023-01-25 DIAGNOSIS — N5201 Erectile dysfunction due to arterial insufficiency: Secondary | ICD-10-CM | POA: Diagnosis not present

## 2023-03-14 ENCOUNTER — Encounter: Payer: Self-pay | Admitting: Internal Medicine

## 2023-03-15 ENCOUNTER — Other Ambulatory Visit: Payer: Self-pay | Admitting: Internal Medicine

## 2023-03-15 DIAGNOSIS — I10 Essential (primary) hypertension: Secondary | ICD-10-CM

## 2023-03-15 DIAGNOSIS — Z7252 High risk homosexual behavior: Secondary | ICD-10-CM

## 2023-03-15 MED ORDER — EMTRICITABINE-TENOFOVIR DF 200-300 MG PO TABS
1.0000 | ORAL_TABLET | Freq: Every day | ORAL | 0 refills | Status: DC
Start: 2023-03-15 — End: 2023-04-13

## 2023-03-15 MED ORDER — INDAPAMIDE 1.25 MG PO TABS
1.2500 mg | ORAL_TABLET | Freq: Every day | ORAL | 0 refills | Status: DC
Start: 2023-03-15 — End: 2023-03-16

## 2023-03-16 ENCOUNTER — Other Ambulatory Visit: Payer: Self-pay | Admitting: Internal Medicine

## 2023-03-16 DIAGNOSIS — I10 Essential (primary) hypertension: Secondary | ICD-10-CM

## 2023-03-20 ENCOUNTER — Ambulatory Visit
Admission: RE | Admit: 2023-03-20 | Discharge: 2023-03-20 | Disposition: A | Discharge status: Home / Self Care | Payer: BC Managed Care – PPO | Source: Ambulatory Visit | Attending: Internal Medicine | Admitting: Internal Medicine

## 2023-03-20 DIAGNOSIS — Z87891 Personal history of nicotine dependence: Secondary | ICD-10-CM

## 2023-03-20 DIAGNOSIS — Z122 Encounter for screening for malignant neoplasm of respiratory organs: Secondary | ICD-10-CM

## 2023-03-24 ENCOUNTER — Encounter: Payer: Self-pay | Admitting: Internal Medicine

## 2023-03-25 ENCOUNTER — Other Ambulatory Visit: Payer: Self-pay | Admitting: Internal Medicine

## 2023-03-25 DIAGNOSIS — I10 Essential (primary) hypertension: Secondary | ICD-10-CM

## 2023-03-25 DIAGNOSIS — E785 Hyperlipidemia, unspecified: Secondary | ICD-10-CM

## 2023-03-27 ENCOUNTER — Telehealth: Payer: Self-pay | Admitting: Acute Care

## 2023-03-27 NOTE — Telephone Encounter (Signed)
Results are not available yet. 03/27/23.

## 2023-03-27 NOTE — Telephone Encounter (Signed)
Calling for [facility] Results

## 2023-03-28 ENCOUNTER — Other Ambulatory Visit: Payer: Self-pay | Admitting: Internal Medicine

## 2023-03-28 DIAGNOSIS — I10 Essential (primary) hypertension: Secondary | ICD-10-CM

## 2023-04-05 ENCOUNTER — Other Ambulatory Visit: Payer: Self-pay | Admitting: Acute Care

## 2023-04-05 DIAGNOSIS — Z122 Encounter for screening for malignant neoplasm of respiratory organs: Secondary | ICD-10-CM

## 2023-04-05 DIAGNOSIS — Z87891 Personal history of nicotine dependence: Secondary | ICD-10-CM

## 2023-04-12 ENCOUNTER — Encounter: Payer: Self-pay | Admitting: Internal Medicine

## 2023-04-12 ENCOUNTER — Ambulatory Visit: Payer: BC Managed Care – PPO | Admitting: Internal Medicine

## 2023-04-12 VITALS — BP 142/86 | HR 89 | Temp 98.6°F | Resp 16 | Ht 66.0 in | Wt 174.4 lb

## 2023-04-12 DIAGNOSIS — R10819 Abdominal tenderness, unspecified site: Secondary | ICD-10-CM

## 2023-04-12 DIAGNOSIS — I10 Essential (primary) hypertension: Secondary | ICD-10-CM

## 2023-04-12 DIAGNOSIS — E538 Deficiency of other specified B group vitamins: Secondary | ICD-10-CM | POA: Diagnosis not present

## 2023-04-12 DIAGNOSIS — I517 Cardiomegaly: Secondary | ICD-10-CM | POA: Diagnosis not present

## 2023-04-12 DIAGNOSIS — G63 Polyneuropathy in diseases classified elsewhere: Secondary | ICD-10-CM

## 2023-04-12 DIAGNOSIS — K635 Polyp of colon: Secondary | ICD-10-CM | POA: Insufficient documentation

## 2023-04-12 DIAGNOSIS — E785 Hyperlipidemia, unspecified: Secondary | ICD-10-CM

## 2023-04-12 DIAGNOSIS — Z7252 High risk homosexual behavior: Secondary | ICD-10-CM

## 2023-04-12 LAB — URINALYSIS, ROUTINE W REFLEX MICROSCOPIC
Bilirubin Urine: NEGATIVE
Hgb urine dipstick: NEGATIVE
Ketones, ur: NEGATIVE
Leukocytes,Ua: NEGATIVE
Nitrite: NEGATIVE
RBC / HPF: NONE SEEN (ref 0–?)
Specific Gravity, Urine: 1.01 (ref 1.000–1.030)
Total Protein, Urine: NEGATIVE
Urine Glucose: NEGATIVE
Urobilinogen, UA: 0.2 (ref 0.0–1.0)
WBC, UA: NONE SEEN (ref 0–?)
pH: 7.5 (ref 5.0–8.0)

## 2023-04-12 LAB — LIPID PANEL
Cholesterol: 155 mg/dL (ref 0–200)
HDL: 46.5 mg/dL (ref >39.00)
LDL Cholesterol: 80 mg/dL (ref 0–99)
NonHDL: 108.61
Total CHOL/HDL Ratio: 3
Triglycerides: 145 mg/dL (ref 0.0–149.0)
VLDL: 29 mg/dL (ref 0.0–40.0)

## 2023-04-12 LAB — CBC WITH DIFFERENTIAL/PLATELET
Basophils Absolute: 0 10*3/uL (ref 0.0–0.1)
Basophils Relative: 0.9 % (ref 0.0–3.0)
Eosinophils Absolute: 0.1 10*3/uL (ref 0.0–0.7)
Eosinophils Relative: 1.4 % (ref 0.0–5.0)
HCT: 39.5 % (ref 39.0–52.0)
Hemoglobin: 13.2 g/dL (ref 13.0–17.0)
Lymphocytes Relative: 23.8 % (ref 12.0–46.0)
Lymphs Abs: 1.2 10*3/uL (ref 0.7–4.0)
MCHC: 33.4 g/dL (ref 30.0–36.0)
MCV: 105.8 fL — ABNORMAL HIGH (ref 78.0–100.0)
Monocytes Absolute: 1 10*3/uL (ref 0.1–1.0)
Monocytes Relative: 20.1 % — ABNORMAL HIGH (ref 3.0–12.0)
Neutro Abs: 2.7 10*3/uL (ref 1.4–7.7)
Neutrophils Relative %: 53.8 % (ref 43.0–77.0)
Platelets: 354 10*3/uL (ref 150.0–400.0)
RBC: 3.73 Mil/uL — ABNORMAL LOW (ref 4.22–5.81)
RDW: 13.5 % (ref 11.5–15.5)
WBC: 5 10*3/uL (ref 4.0–10.5)

## 2023-04-12 LAB — BASIC METABOLIC PANEL
BUN: 17 mg/dL (ref 6–23)
CO2: 31 meq/L (ref 19–32)
Calcium: 9.6 mg/dL (ref 8.4–10.5)
Chloride: 99 meq/L (ref 96–112)
Creatinine, Ser: 0.96 mg/dL (ref 0.40–1.50)
GFR: 88.93 mL/min (ref >60.00)
Glucose, Bld: 97 mg/dL (ref 70–99)
Potassium: 4.4 meq/L (ref 3.5–5.1)
Sodium: 138 meq/L (ref 135–145)

## 2023-04-12 LAB — FOLATE: Folate: 9.7 ng/mL (ref >5.9)

## 2023-04-12 LAB — HEPATIC FUNCTION PANEL
ALT: 41 U/L (ref 0–53)
AST: 33 U/L (ref 0–37)
Albumin: 4.4 g/dL (ref 3.5–5.2)
Alkaline Phosphatase: 75 U/L (ref 39–117)
Bilirubin, Direct: 0.1 mg/dL (ref 0.0–0.3)
Total Bilirubin: 0.4 mg/dL (ref 0.2–1.2)
Total Protein: 7.5 g/dL (ref 6.0–8.3)

## 2023-04-12 LAB — BRAIN NATRIURETIC PEPTIDE: Pro B Natriuretic peptide (BNP): 11 pg/mL (ref 0.0–100.0)

## 2023-04-12 LAB — LIPASE: Lipase: 44 U/L (ref 11.0–59.0)

## 2023-04-12 LAB — VITAMIN B12: Vitamin B-12: 639 pg/mL (ref 211–911)

## 2023-04-12 NOTE — Progress Notes (Unsigned)
Subjective:  Patient ID: Caleb Obrien, male    DOB: 08-07-1965  Age: 57 y.o. MRN: [id number]  CC: Hypertension   HPI SHAHIN POLCZYNSKI presents for f/up ----  Discussed the use of AI scribe software for clinical note transcription with the patient, who gave verbal consent to proceed.  History of Present Illness   The patient, with a history of hypertension, presents for a routine follow-up. He reports no symptoms of dizziness, lightheadedness, chest pain, or shortness of breath. He denies any symptoms suggestive of B12 deficiency such as numbness, weakness, or tingling. He denies any symptoms suggestive of high or low blood pressure, but occasionally experiences lightheadedness upon standing from a squatting position.  The patient denies any leg or feet swelling and any signs of intestinal bleeding. He expresses a need for a follow-up colonoscopy, as it has been five years since his last one, during which polyps were found.  He also expresses concern about a recent lung scan that showed an enlarged heart, a change from a previous scan. Despite this, he denies any symptoms of heart disease such as chest pain, shortness of breath, or leg swelling. He has quit smoking and denies any symptoms of emphysema such as coughing or wheezing.  The patient also reports a week-long history of abdominal pain, localized to the lower abdomen. He denies any changes in urination, nausea, or vomiting. He has been urinating frequently, which he attributes to a previous surgery. He denies any signs of a urinary tract infection such as cloudy or bloody urine.       Outpatient Medications Prior to Visit  Medication Sig Dispense Refill   Albuterol-Budesonide (AIRSUPRA) 90-80 MCG/ACT AERO Inhale 2 puffs into the lungs 4 (four) times daily. (Patient taking differently: Inhale 2 puffs into the lungs 4 (four) times daily as needed (shortness of breath).) 32.1 g 1   ALPRAZolam (XANAX) 0.25 MG tablet TAKE 1/2 TABLET  BY MOUTH DAILY AS NEEDED 30 tablet 3   Ascorbic Acid (VITAMIN C PO) Take by mouth.     atorvastatin (LIPITOR) 40 MG tablet TAKE 1 TABLET BY MOUTH DAILY 90 tablet 0   buPROPion ER (WELLBUTRIN SR) 100 MG 12 hr tablet TAKE 1 TABLET BY MOUTH TWICE A DAY 180 tablet 0   carvedilol (COREG) 3.125 MG tablet TAKE 1 TABLET BY MOUTH TWICE A DAY WITH A MEAL 180 tablet 0   Cyanocobalamin (VITAMIN B12 PO) Take by mouth.     dexlansoprazole (DEXILANT) 60 MG capsule TAKE 1 CAPSULE BY MOUTH EVERY DAY 90 capsule 1   indapamide (LOZOL) 1.25 MG tablet TAKE 1 TABLET BY MOUTH DAILY 90 tablet 0   irbesartan (AVAPRO) 300 MG tablet TAKE 1 TABLET BY MOUTH DAILY 90 tablet 0   MILK THISTLE PO Take by mouth.     tadalafil (CIALIS) 5 MG tablet Take 5 mg by mouth daily.     VITAMIN D PO Take by mouth.     emtricitabine-tenofovir (TRUVADA) 200-300 MG tablet Take 1 tablet by mouth daily. 30 tablet 0   No facility-administered medications prior to visit.    ROS Review of Systems  Constitutional:  Positive for unexpected weight change (wt gain). Negative for appetite change, chills, diaphoresis and fatigue.  HENT: Negative.    Eyes: Negative.   Respiratory: Negative.  Negative for cough, chest tightness, shortness of breath and wheezing.   Cardiovascular:  Negative for chest pain, palpitations and leg swelling.  Gastrointestinal:  Positive for abdominal pain. Negative for blood in  stool, constipation, diarrhea, nausea and vomiting.  Endocrine: Positive for polyuria.  Genitourinary:  Positive for frequency. Negative for dysuria, hematuria, penile swelling, scrotal swelling, testicular pain and urgency.       Suprapubic pain for one week  Musculoskeletal: Negative.  Negative for back pain and myalgias.  Skin: Negative.  Negative for color change and rash.  Neurological: Negative.  Negative for dizziness, weakness and light-headedness.  Hematological:  Negative for adenopathy. Does not bruise/bleed easily.   Psychiatric/Behavioral:  Negative for decreased concentration and dysphoric mood. The patient is nervous/anxious.     Objective:  BP (!) 142/86 (BP Location: Right Arm, Patient Position: Sitting, Cuff Size: Large)   Pulse 89   Temp 98.6 F (37 C) (Oral)   Resp 16   Ht 5\' 6"  (1.676 m)   Wt 174 lb 6.4 oz (79.1 kg)   SpO2 96%   BMI 28.15 kg/m   BP Readings from Last 3 Encounters:  04/12/23 (!) 142/86  01/18/23 120/80  10/25/22 100/71    Wt Readings from Last 3 Encounters:  04/12/23 174 lb 6.4 oz (79.1 kg)  01/18/23 170 lb (77.1 kg)  10/24/22 169 lb 15.6 oz (77.1 kg)    Physical Exam Vitals reviewed.  Constitutional:      Appearance: Normal appearance.  HENT:     Mouth/Throat:     Mouth: Mucous membranes are moist.  Eyes:     General: No scleral icterus.    Conjunctiva/sclera: Conjunctivae normal.  Cardiovascular:     Rate and Rhythm: Normal rate and regular rhythm.     Heart sounds: No murmur heard.    Comments: EKG- NSR, 85 bpm No LVH, Q waves, or ST/T waves  Pulmonary:     Effort: Pulmonary effort is normal.     Breath sounds: No stridor. No wheezing, rhonchi or rales.  Abdominal:     General: Abdomen is flat.     Palpations: There is no mass.     Tenderness: There is no abdominal tenderness. There is no guarding.     Hernia: No hernia is present.  Musculoskeletal:        General: Normal range of motion.     Cervical back: Neck supple.     Right lower leg: No edema.     Left lower leg: No edema.  Lymphadenopathy:     Cervical: No cervical adenopathy.  Skin:    Coloration: Skin is not pale.     Findings: No rash.  Neurological:     General: No focal deficit present.     Mental Status: He is alert. Mental status is at baseline.  Psychiatric:        Mood and Affect: Mood normal.        Behavior: Behavior normal.     Lab Results  Component Value Date   WBC 5.0 04/12/2023   HGB 13.2 04/12/2023   HCT 39.5 04/12/2023   PLT 354.0 04/12/2023    GLUCOSE 97 04/12/2023   CHOL 155 04/12/2023   TRIG 145.0 04/12/2023   HDL 46.50 04/12/2023   LDLDIRECT 144.4 01/27/2012   LDLCALC 80 04/12/2023   ALT 41 04/12/2023   AST 33 04/12/2023   NA 138 04/12/2023   K 4.4 04/12/2023   CL 99 04/12/2023   CREATININE 0.96 04/12/2023   BUN 17 04/12/2023   CO2 31 04/12/2023   TSH 3.41 06/09/2022   PSA 0.0 01/18/2023    CT CHEST LUNG CA SCREEN LOW DOSE W/O CM  Result Date: 04/05/2023  CLINICAL DATA:  Former 24 pack-year smoker, quit 2 years ago. Prostate cancer. EXAM: CT CHEST WITHOUT CONTRAST LOW-DOSE FOR LUNG CANCER SCREENING TECHNIQUE: Multidetector CT imaging of the chest was performed following the standard protocol without IV contrast. RADIATION DOSE REDUCTION: This exam was performed according to the departmental dose-optimization program which includes automated exposure control, adjustment of the mA and/or kV according to patient size and/or use of iterative reconstruction technique. COMPARISON:  03/16/2022. FINDINGS: Cardiovascular: Atherosclerotic calcification of the aorta. Heart is at the upper limits of normal in size. No pericardial effusion. Mediastinum/Nodes: Prominent mediastinal fat in the right paratracheal region, as before. No pathologically enlarged mediastinal or axillary lymph nodes. Hilar regions are difficult to definitively evaluate without IV contrast. Esophagus is grossly unremarkable. Lungs/Pleura: Mild centrilobular emphysema. No suspicious pulmonary nodules. No pleural fluid. Minimal adherent debris in the bronchus intermedius. Upper Abdomen: Visualized portions of the liver, gallbladder, adrenal glands, kidneys, spleen, pancreas, stomach and bowel are grossly unremarkable. No upper abdominal adenopathy. Musculoskeletal: Degenerative changes in the spine. No worrisome lytic or sclerotic lesions. IMPRESSION: 1. Lung-RADS 1, negative. Continue annual screening with low-dose chest CT without contrast in 12 months. 2.  Aortic  atherosclerosis (ICD10-I70.0). 3.  Emphysema (ICD10-J43.9). Electronically Signed   By: Leanna Battles M.D.   On: 04/05/2023 09:48    Assessment & Plan:  Vitamin B12 deficiency neuropathy (HCC) -     Basic metabolic panel; Future -     CBC with Differential/Platelet; Future -     Folate; Future -     Vitamin B12; Future  Essential hypertension, benign- EKG is negative for LVH. BP is not at goal. He is working on his lifestyle modifications. -     CBC with Differential/Platelet; Future -     Hepatic function panel; Future -     EKG 12-Lead -     Urinalysis, Routine w reflex microscopic; Future  Hyperlipidemia with target LDL less than 130 -     Lipid panel; Future -     Hepatic function panel; Future  Suprapubic tenderness -     Urinalysis, Routine w reflex microscopic; Future -     Lipase; Future  Cardiomegaly- Will evaluate with an ECHO. -     Vitamin B1; Future -     Brain natriuretic peptide; Future -     ECHOCARDIOGRAM COMPLETE; Future  Polyp of colon, unspecified part of colon, unspecified type -     Ambulatory referral to Gastroenterology  High risk homosexual behavior -     Emtricitabine-Tenofovir DF; Take 1 tablet by mouth daily.  Dispense: 30 tablet; Refill: 0     Follow-up: Return in about 3 months (around 07/13/2023).  Sanda Linger, MD

## 2023-04-12 NOTE — Patient Instructions (Signed)
Hypertension, Adult High blood pressure (hypertension) is when the force of blood pumping through the arteries is too strong. The arteries are the blood vessels that carry blood from the heart throughout the body. Hypertension forces the heart to work harder to pump blood and may cause arteries to become narrow or stiff. Untreated or uncontrolled hypertension can lead to a heart attack, heart failure, a stroke, kidney disease, and other problems. A blood pressure reading consists of a higher number over a lower number. Ideally, your blood pressure should be below 120/80. The first ("top") number is called the systolic pressure. It is a measure of the pressure in your arteries as your heart beats. The second ("bottom") number is called the diastolic pressure. It is a measure of the pressure in your arteries as the heart relaxes. What are the causes? The exact cause of this condition is not known. There are some conditions that result in high blood pressure. What increases the risk? Certain factors may make you more likely to develop high blood pressure. Some of these risk factors are under your control, including: Smoking. Not getting enough exercise or physical activity. Being overweight. Having too much fat, sugar, calories, or salt (sodium) in your diet. Drinking too much alcohol. Other risk factors include: Having a personal history of heart disease, diabetes, high cholesterol, or kidney disease. Stress. Having a family history of high blood pressure and high cholesterol. Having obstructive sleep apnea. Age. The risk increases with age. What are the signs or symptoms? High blood pressure may not cause symptoms. Very high blood pressure (hypertensive crisis) may cause: Headache. Fast or irregular heartbeats (palpitations). Shortness of breath. Nosebleed. Nausea and vomiting. Vision changes. Severe chest pain, dizziness, and seizures. How is this diagnosed? This condition is diagnosed by  measuring your blood pressure while you are seated, with your arm resting on a flat surface, your legs uncrossed, and your feet flat on the floor. The cuff of the blood pressure monitor will be placed directly against the skin of your upper arm at the level of your heart. Blood pressure should be measured at least twice using the same arm. Certain conditions can cause a difference in blood pressure between your right and left arms. If you have a high blood pressure reading during one visit or you have normal blood pressure with other risk factors, you may be asked to: Return on a different day to have your blood pressure checked again. Monitor your blood pressure at home for 1 week or longer. If you are diagnosed with hypertension, you may have other blood or imaging tests to help your health care provider understand your overall risk for other conditions. How is this treated? This condition is treated by making healthy lifestyle changes, such as eating healthy foods, exercising more, and reducing your alcohol intake. You may be referred for counseling on a healthy diet and physical activity. Your health care provider may prescribe medicine if lifestyle changes are not enough to get your blood pressure under control and if: Your systolic blood pressure is above 130. Your diastolic blood pressure is above 80. Your personal target blood pressure may vary depending on your medical conditions, your age, and other factors. Follow these instructions at home: Eating and drinking  Eat a diet that is high in fiber and potassium, and low in sodium, added sugar, and fat. An example of this eating plan is called the DASH diet. DASH stands for Dietary Approaches to Stop Hypertension. To eat this way: Eat   plenty of fresh fruits and vegetables. Try to fill one half of your plate at each meal with fruits and vegetables. Eat whole grains, such as whole-wheat pasta, brown rice, or whole-grain bread. Fill about one  fourth of your plate with whole grains. Eat or drink low-fat dairy products, such as skim milk or low-fat yogurt. Avoid fatty cuts of meat, processed or cured meats, and poultry with skin. Fill about one fourth of your plate with lean proteins, such as fish, chicken without skin, beans, eggs, or tofu. Avoid pre-made and processed foods. These tend to be higher in sodium, added sugar, and fat. Reduce your daily sodium intake. Many people with hypertension should eat less than 1,500 mg of sodium a day. Do not drink alcohol if: Your health care provider tells you not to drink. You are pregnant, may be pregnant, or are planning to become pregnant. If you drink alcohol: Limit how much you have to: 0-1 drink a day for women. 0-2 drinks a day for men. Know how much alcohol is in your drink. In the [place] one drink equals one 12 oz bottle of beer (355 mL), one 5 oz glass of wine (148 mL), or one 1 oz glass of hard liquor (44 mL). Lifestyle  Work with your health care provider to maintain a healthy body weight or to lose weight. Ask what an ideal weight is for you. Get at least 30 minutes of exercise that causes your heart to beat faster (aerobic exercise) most days of the week. Activities may include walking, swimming, or biking. Include exercise to strengthen your muscles (resistance exercise), such as Pilates or lifting weights, as part of your weekly exercise routine. Try to do these types of exercises for 30 minutes at least 3 days a week. Do not use any products that contain nicotine or tobacco. These products include cigarettes, chewing tobacco, and vaping devices, such as e-cigarettes. If you need help quitting, ask your health care provider. Monitor your blood pressure at home as told by your health care provider. Keep all follow-up visits. This is important. Medicines Take over-the-counter and prescription medicines only as told by your health care provider. Follow directions carefully. Blood  pressure medicines must be taken as prescribed. Do not skip doses of blood pressure medicine. Doing this puts you at risk for problems and can make the medicine less effective. Ask your health care provider about side effects or reactions to medicines that you should watch for. Contact a health care provider if you: Think you are having a reaction to a medicine you are taking. Have headaches that keep coming back (recurring). Feel dizzy. Have swelling in your ankles. Have trouble with your vision. Get help right away if you: Develop a severe headache or confusion. Have unusual weakness or numbness. Feel faint. Have severe pain in your chest or abdomen. Vomit repeatedly. Have trouble breathing. These symptoms may be an emergency. Get help right away. Call 911. Do not wait to see if the symptoms will go away. Do not drive yourself to the hospital. Summary Hypertension is when the force of blood pumping through your arteries is too strong. If this condition is not controlled, it may put you at risk for serious complications. Your personal target blood pressure may vary depending on your medical conditions, your age, and other factors. For most people, a normal blood pressure is less than 120/80. Hypertension is treated with lifestyle changes, medicines, or a combination of both. Lifestyle changes include losing weight, eating a healthy,   low-sodium diet, exercising more, and limiting alcohol. This information is not intended to replace advice given to you by your health care provider. Make sure you discuss any questions you have with your health care provider. Document Revised: 04/06/2021 Document Reviewed: 04/06/2021 Elsevier Patient Education  2024 Elsevier Inc.  

## 2023-04-13 ENCOUNTER — Other Ambulatory Visit: Payer: Self-pay | Admitting: Internal Medicine

## 2023-04-13 ENCOUNTER — Ambulatory Visit: Payer: BC Managed Care – PPO | Admitting: Internal Medicine

## 2023-04-13 DIAGNOSIS — Z7252 High risk homosexual behavior: Secondary | ICD-10-CM

## 2023-04-13 MED ORDER — EMTRICITABINE-TENOFOVIR DF 200-300 MG PO TABS: 1.0000 | ORAL_TABLET | Freq: Every day | ORAL | 0 refills | Status: AC

## 2023-04-17 LAB — VITAMIN B1: Vitamin B1 (Thiamine): 10 nmol/L (ref 8–30)

## 2023-04-21 DIAGNOSIS — G4733 Obstructive sleep apnea (adult) (pediatric): Secondary | ICD-10-CM | POA: Diagnosis not present

## 2023-04-28 ENCOUNTER — Telehealth: Payer: Self-pay | Admitting: Internal Medicine

## 2023-04-28 NOTE — Telephone Encounter (Signed)
Pt would like to transfer care from [facility]inger to [employer] at [facility]. Did not state a reason. Please advise

## 2023-05-04 ENCOUNTER — Ambulatory Visit (HOSPITAL_BASED_OUTPATIENT_CLINIC_OR_DEPARTMENT_OTHER): Payer: BC Managed Care – PPO

## 2023-05-04 DIAGNOSIS — I517 Cardiomegaly: Secondary | ICD-10-CM | POA: Diagnosis not present

## 2023-05-04 LAB — ECHOCARDIOGRAM COMPLETE
Area-P 1/2: 3.99 cm2
S' Lateral: 2.5 cm

## 2023-05-09 NOTE — Telephone Encounter (Signed)
Pt called back and was told of no availability for transfer.

## 2023-05-17 ENCOUNTER — Encounter: Payer: Self-pay | Admitting: Gastroenterology

## 2023-05-17 ENCOUNTER — Ambulatory Visit (AMBULATORY_SURGERY_CENTER): Payer: BC Managed Care – PPO

## 2023-05-17 VITALS — Ht 66.0 in | Wt 175.0 lb

## 2023-05-17 DIAGNOSIS — Z860100 Personal history of colon polyps, unspecified: Secondary | ICD-10-CM

## 2023-05-17 MED ORDER — NA SULFATE-K SULFATE-MG SULF 17.5-3.13-1.6 GM/177ML PO SOLN
1.0000 | Freq: Once | ORAL | 0 refills | Status: AC
Start: 2023-05-17 — End: 2023-05-17

## 2023-05-17 NOTE — Progress Notes (Signed)

## 2023-05-23 ENCOUNTER — Encounter: Payer: Self-pay | Admitting: Certified Registered Nurse Anesthetist

## 2023-05-24 ENCOUNTER — Encounter: Payer: Self-pay | Admitting: Gastroenterology

## 2023-05-24 ENCOUNTER — Ambulatory Visit: Payer: BC Managed Care – PPO | Admitting: Gastroenterology

## 2023-05-24 VITALS — BP 117/76 | HR 76 | Temp 98.0°F | Resp 20 | Ht 66.0 in | Wt 175.0 lb

## 2023-05-24 DIAGNOSIS — D125 Benign neoplasm of sigmoid colon: Secondary | ICD-10-CM

## 2023-05-24 DIAGNOSIS — K648 Other hemorrhoids: Secondary | ICD-10-CM

## 2023-05-24 DIAGNOSIS — K5730 Diverticulosis of large intestine without perforation or abscess without bleeding: Secondary | ICD-10-CM

## 2023-05-24 DIAGNOSIS — Z1211 Encounter for screening for malignant neoplasm of colon: Secondary | ICD-10-CM | POA: Diagnosis not present

## 2023-05-24 DIAGNOSIS — Z860100 Personal history of colon polyps, unspecified: Secondary | ICD-10-CM

## 2023-05-24 MED ORDER — SODIUM CHLORIDE 0.9 % IV SOLN: 500.0000 mL | Freq: Once | INTRAVENOUS | Status: DC

## 2023-05-24 NOTE — Patient Instructions (Signed)
 Educational handout provided to patient related to Hemorrhoids, Polyps, and Diverticulosis  Resume previous diet  Continue present medications  Awaiting pathology results   YOU HAD AN ENDOSCOPIC PROCEDURE TODAY AT THE [facility]:   Refer to the procedure report that was given to you for any specific questions about what was found during the examination.  If the procedure report does not answer your questions, please call your gastroenterologist to clarify.  If you requested that your care partner not be given the details of your procedure findings, then the procedure report has been included in a sealed envelope for you to review at your convenience later.  YOU SHOULD EXPECT: Some feelings of bloating in the abdomen. Passage of more gas than usual.  Walking can help get rid of the air that was put into your GI tract during the procedure and reduce the bloating. If you had a lower endoscopy (such as a colonoscopy or flexible sigmoidoscopy) you may notice spotting of blood in your stool or on the toilet paper. If you underwent a bowel prep for your procedure, you may not have a normal bowel movement for a few days.  Please Note:  You might notice some irritation and congestion in your nose or some drainage.  This is from the oxygen used during your procedure.  There is no need for concern and it should clear up in a day or so.  SYMPTOMS TO REPORT IMMEDIATELY:  Following lower endoscopy (colonoscopy or flexible sigmoidoscopy):  Excessive amounts of blood in the stool  Significant tenderness or worsening of abdominal pains  Swelling of the abdomen that is new, acute  Fever of 100F or higher   For urgent or emergent issues, a gastroenterologist can be reached at any hour by calling [phone]. Do not use MyChart messaging for urgent concerns.    DIET:  We do recommend a small meal at first, but then you may proceed to your regular diet.  Drink plenty of fluids but you should  avoid alcoholic beverages for 24 hours.  ACTIVITY:  You should plan to take it easy for the rest of today and you should NOT DRIVE or use heavy machinery until tomorrow (because of the sedation medicines used during the test).    FOLLOW UP: Our staff will call the number listed on your records the next business day following your procedure.  We will call around 7:15- 8:00 am to check on you and address any questions or concerns that you may have regarding the information given to you following your procedure. If we do not reach you, we will leave a message.     If any biopsies were taken you will be contacted by phone or by letter within the next 1-3 weeks.  Please call us at [phone] if you have not heard about the biopsies in 3 weeks.    SIGNATURES/CONFIDENTIALITY: You and/or your care partner have signed paperwork which will be entered into your electronic medical record.  These signatures attest to the fact that that the information above on your After Visit Summary has been reviewed and is understood.  Full responsibility of the confidentiality of this discharge information lies with you and/or your care-partner.

## 2023-05-24 NOTE — Op Note (Signed)
[facility] Patient Name: Caleb Obrien Procedure Date: 05/24/2023 1:59 PM MRN: [id number] Endoscopist: Viviann Spare P. Adela Lank , MD, [id number] Age: 57 Referring MD:  Date of Birth: 1965-11-05 Gender: Male Account #: [id number] Procedure:                Colonoscopy Indications:              High risk colon cancer surveillance: Personal                            history of colonic polyps - a few adenomas removed                            on last exam 04/2018 Medicines:                Monitored Anesthesia Care Procedure:                Pre-Anesthesia Assessment:                           - Prior to the procedure, a History and Physical                            was performed, and patient medications and                            allergies were reviewed. The patient's tolerance of                            previous anesthesia was also reviewed. The risks                            and benefits of the procedure and the sedation                            options and risks were discussed with the patient.                            All questions were answered, and informed consent                            was obtained. Prior Anticoagulants: The patient has                            taken no anticoagulant or antiplatelet agents. ASA                            Grade Assessment: III - A patient with severe                            systemic disease. After reviewing the risks and                            benefits, the patient was deemed in satisfactory  condition to undergo the procedure.                           After obtaining informed consent, the colonoscope                            was passed under direct vision. Throughout the                            procedure, the patient's blood pressure, pulse, and                            oxygen saturations were monitored continuously. The                            CF [id number] #[id number] was  introduced through the anus                            and advanced to the the cecum, identified by                            appendiceal orifice and ileocecal valve. The                            colonoscopy was performed without difficulty. The                            patient tolerated the procedure well. The quality                            of the bowel preparation was adequate. The                            ileocecal valve, appendiceal orifice, and rectum                            were photographed. Scope In: 2:15:16 PM Scope Out: 2:29:01 PM Scope Withdrawal Time: 0 hours 9 minutes 30 seconds  Total Procedure Duration: 0 hours 13 minutes 45 seconds  Findings:                 The perianal and digital rectal examinations were                            normal.                           Many diverticula were found in the transverse colon                            and left colon.                           A 4 mm polyp was found in the sigmoid colon. The  polyp was sessile. The polyp was removed with a                            cold snare. Resection and retrieval were complete.                           Internal hemorrhoids were found during                            retroflexion. The hemorrhoids were small.                           The exam was otherwise without abnormality. Complications:            No immediate complications. Estimated blood loss:                            Minimal. Estimated Blood Loss:     Estimated blood loss was minimal. Impression:               - Diverticulosis in the transverse colon and in the                            left colon.                           - One 4 mm polyp in the sigmoid colon, removed with                            a cold snare. Resected and retrieved.                           - Internal hemorrhoids.                           - The examination was otherwise normal. Recommendation:           - Patient  has a contact number available for                            emergencies. The signs and symptoms of potential                            delayed complications were discussed with the                            patient. Return to normal activities tomorrow.                            Written discharge instructions were provided to the                            patient.                           - Resume previous diet.                           -  Continue present medications.                           - Await pathology results. Viviann Spare P. Alonzo Loving, MD 05/24/2023 2:33:10 PM This report has been signed electronically.

## 2023-05-24 NOTE — Progress Notes (Signed)
Called to room to assist during endoscopic procedure.  Patient ID and intended procedure confirmed with present staff. Received instructions for my participation in the procedure from the performing physician.  

## 2023-05-24 NOTE — Progress Notes (Signed)
Pt's states no medical or surgical changes since previsit or office visit. 

## 2023-05-24 NOTE — Progress Notes (Signed)
[facility] History and Physical   Primary Care Physician:  Etta Grandchild, MD   Reason for Procedure:   History of colon polyps  Plan:    colonoscopy     HPI: Caleb Obrien is a 57 y.o. male  here for colonoscopy  surveillance - adenomas removed on last colonoscopy 04/2018.   Patient denies any bowel symptoms at this time. No family history of colon cancer known. Otherwise feels well without any cardiopulmonary symptoms.   I have discussed risks / benefits of anesthesia and endoscopic procedure with Ceasar Lund and they wish to proceed with the exams as outlined today.    Past Medical History:  Diagnosis Date   ALLERGIC RHINITIS CAUSE UNSPECIFIED 02/24/2009   Allergy    ASTHMA 02/05/2007   BENIGN PROSTATIC HYPERTROPHY, WITH URINARY OBSTRUCTION 05/22/2007   Cancer (HCC)    Prostate   Dermatophytosis of foot 11/02/2009   Emphysema of lung (HCC)    Very early   GANGLION CYST, WRIST, LEFT 03/25/2009   Hemangioma of skin and subcutaneous tissue 06/30/2009   HYPERLIPIDEMIA, BORDERLINE 04/21/2008   Hypertension    PLANTAR WART, RIGHT 01/23/2009   Sleep apnea    uses c-pap    Past Surgical History:  Procedure Laterality Date   COLONOSCOPY  2019   eyelid surgery  04/11/2018   Bil   LYMPHADENECTOMY Bilateral 10/24/2022   Procedure: BILATERAL PELVIC LYMPHADENECTOMY;  Surgeon: Heloise Purpura, MD;  Location: [facility];  Service: Urology;  Laterality: Bilateral;   POLYPECTOMY  2019   ROBOT ASSISTED LAPAROSCOPIC RADICAL PROSTATECTOMY N/A 10/24/2022   Procedure: XI ROBOTIC ASSISTED LAPAROSCOPIC RADICAL PROSTATECTOMY LEVEL 2;  Surgeon: Heloise Purpura, MD;  Location: [facility];  Service: Urology;  Laterality: N/A;  210 MINUTES NEEDED FOR CASE   WISDOM TOOTH EXTRACTION      Prior to Admission medications   Medication Sig Start Date End Date Taking? Authorizing Provider  Ascorbic Acid (VITAMIN C PO) Take by mouth.   Yes [provider]  ASPIRIN LOW DOSE  81 MG tablet Take 81 mg by mouth daily. 03/25/23  Yes [provider]  atorvastatin (LIPITOR) 40 MG tablet TAKE 1 TABLET BY MOUTH DAILY 03/26/23  Yes Etta Grandchild, MD  carvedilol (COREG) 3.125 MG tablet TAKE 1 TABLET BY MOUTH TWICE A DAY WITH A MEAL 03/26/23  Yes Etta Grandchild, MD  Cyanocobalamin (VITAMIN B12 PO) Take by mouth.   Yes [provider]  dexlansoprazole (DEXILANT) 60 MG capsule TAKE 1 CAPSULE BY MOUTH EVERY DAY 06/18/22  Yes Etta Grandchild, MD  emtricitabine-tenofovir (TRUVADA) 200-300 MG tablet Take 1 tablet by mouth daily. 04/13/23  Yes Etta Grandchild, MD  indapamide (LOZOL) 1.25 MG tablet TAKE 1 TABLET BY MOUTH DAILY 03/16/23  Yes Etta Grandchild, MD  irbesartan (AVAPRO) 300 MG tablet TAKE 1 TABLET BY MOUTH DAILY 03/28/23  Yes Etta Grandchild, MD  MILK THISTLE PO Take by mouth.   Yes [provider]  tadalafil (CIALIS) 5 MG tablet Take 5 mg by mouth daily. 12/31/22  Yes [provider]  VITAMIN D PO Take by mouth.   Yes [provider]  Albuterol-Budesonide (AIRSUPRA) 90-80 MCG/ACT AERO Inhale 2 puffs into the lungs 4 (four) times daily. Patient not taking: Reported on 05/17/2023 09/20/22   Etta Grandchild, MD  ALPRAZolam Prudy Feeler) 0.25 MG tablet TAKE 1/2 TABLET BY MOUTH DAILY AS NEEDED 07/11/22   Etta Grandchild, MD    Current Outpatient Medications  Medication  Sig Dispense Refill   Ascorbic Acid (VITAMIN C PO) Take by mouth.     ASPIRIN LOW DOSE 81 MG tablet Take 81 mg by mouth daily.     atorvastatin (LIPITOR) 40 MG tablet TAKE 1 TABLET BY MOUTH DAILY 90 tablet 0   carvedilol (COREG) 3.125 MG tablet TAKE 1 TABLET BY MOUTH TWICE A DAY WITH A MEAL 180 tablet 0   Cyanocobalamin (VITAMIN B12 PO) Take by mouth.     dexlansoprazole (DEXILANT) 60 MG capsule TAKE 1 CAPSULE BY MOUTH EVERY DAY 90 capsule 1   emtricitabine-tenofovir (TRUVADA) 200-300 MG tablet Take 1 tablet by mouth daily. 30 tablet 0   indapamide (LOZOL) 1.25 MG tablet TAKE  1 TABLET BY MOUTH DAILY 90 tablet 0   irbesartan (AVAPRO) 300 MG tablet TAKE 1 TABLET BY MOUTH DAILY 90 tablet 0   MILK THISTLE PO Take by mouth.     tadalafil (CIALIS) 5 MG tablet Take 5 mg by mouth daily.     VITAMIN D PO Take by mouth.     Albuterol-Budesonide (AIRSUPRA) 90-80 MCG/ACT AERO Inhale 2 puffs into the lungs 4 (four) times daily. (Patient not taking: Reported on 05/17/2023) 32.1 g 1   ALPRAZolam (XANAX) 0.25 MG tablet TAKE 1/2 TABLET BY MOUTH DAILY AS NEEDED 30 tablet 3   Current Facility-Administered Medications  Medication Dose Route Frequency Provider Last Rate Last Admin   0.9 %  sodium chloride infusion  500 mL Intravenous Once Pedro Whiters, Willaim Rayas, MD        Allergies as of 05/24/2023 - Review Complete 05/24/2023  Allergen Reaction Noted   Amlodipine  02/07/2014   Lisinopril  02/07/2014   Sudafed [pseudoephedrine hcl] Other (See Comments) 11/26/2014    Family History  Problem Relation Age of Onset   Hypertension Mother    Arthritis Mother    Prostate cancer Father    Healthy Sister    Colon cancer Neg Hx    Esophageal cancer Neg Hx    Stomach cancer Neg Hx    Rectal cancer Neg Hx     Social History   Socioeconomic History   Marital status: Single    Spouse name: Not on file   Number of children: Not on file   Years of education: Not on file   Highest education level: Bachelor's degree (e.g., BA, AB, BS)  Occupational History   Occupation: HR    Employer: [employer]  Tobacco Use   Smoking status: Former    Current packs/day: 0.00    Types: Cigarettes    Quit date: 10/2017    Years since quitting: 5.6    Passive exposure: Never   Smokeless tobacco: Never  Vaping Use   Vaping status: Never Used  Substance and Sexual Activity   Alcohol use: Yes    Alcohol/week: 10.0 standard drinks of alcohol    Types: 10 Shots of liquor per week   Drug use: No   Sexual activity: Yes    Partners: Male  Other Topics Concern   Not on file   Social History Narrative   Not on file   Social Determinants of Health   Financial Resource Strain: Low Risk  (09/20/2022)   Overall Financial Resource Strain (CARDIA)    Difficulty of Paying Living Expenses: Not hard at all  Food Insecurity: No Food Insecurity (10/24/2022)   Hunger Vital Sign    Worried About Running Out of Food in the Last Year: Never true    Ran Out of Food  in the Last Year: Never true  Transportation Needs: No Transportation Needs (10/24/2022)   PRAPARE - Administrator, Civil Service (Medical): No    Lack of Transportation (Non-Medical): No  Physical Activity: Unknown (09/20/2022)   Exercise Vital Sign    Days of Exercise per Week: 0 days    Minutes of Exercise per Session: Not on file  Stress: No Stress Concern Present (09/20/2022)   [employer] of Occupational Health - Occupational Stress Questionnaire    Feeling of Stress : Only a little  Social Connections: Socially Isolated (09/20/2022)   Social Connection and Isolation Panel [NHANES]    Frequency of Communication with Friends and Family: More than three times a week    Frequency of Social Gatherings with Friends and Family: Twice a week    Attends Religious Services: Never    Database administrator or Organizations: No    Attends Engineer, structural: Not on file    Marital Status: Divorced  Intimate Partner Violence: Not At Risk (10/24/2022)   Humiliation, Afraid, Rape, and Kick questionnaire    Fear of Current or Ex-Partner: No    Emotionally Abused: No    Physically Abused: No    Sexually Abused: No    Review of Systems: All other review of systems negative except as mentioned in the HPI.  Physical Exam: Vital signs BP 117/72   Pulse 83   Temp 98 F (36.7 C) (Temporal)   Ht 5\' 6"  (1.676 m)   Wt 175 lb (79.4 kg)   SpO2 98%   BMI 28.25 kg/m   General:   Alert,  Well-developed, pleasant and cooperative in NAD Lungs:  Clear throughout to auscultation.   Heart:   Regular rate and rhythm Abdomen:  Soft, nontender and nondistended.   Neuro/Psych:  Alert and cooperative. Normal mood and affect. A and O x 3  Harlin Rain, MD [facility]

## 2023-05-25 ENCOUNTER — Telehealth: Payer: Self-pay | Admitting: *Deleted

## 2023-05-25 NOTE — Telephone Encounter (Signed)
Post procedure follow up phone call. No answer at number given.  Left message on voicemail.  

## 2023-05-29 ENCOUNTER — Encounter: Payer: Self-pay | Admitting: Gastroenterology

## 2023-05-29 LAB — SURGICAL PATHOLOGY

## 2023-06-20 ENCOUNTER — Encounter: Payer: Self-pay | Admitting: Gastroenterology

## 2023-06-22 ENCOUNTER — Other Ambulatory Visit: Payer: Self-pay | Admitting: Internal Medicine

## 2023-06-22 DIAGNOSIS — I10 Essential (primary) hypertension: Secondary | ICD-10-CM

## 2023-06-22 DIAGNOSIS — E785 Hyperlipidemia, unspecified: Secondary | ICD-10-CM

## 2023-06-28 DIAGNOSIS — L821 Other seborrheic keratosis: Secondary | ICD-10-CM | POA: Diagnosis not present

## 2023-06-28 DIAGNOSIS — L218 Other seborrheic dermatitis: Secondary | ICD-10-CM | POA: Diagnosis not present

## 2023-06-28 DIAGNOSIS — L718 Other rosacea: Secondary | ICD-10-CM | POA: Diagnosis not present

## 2023-07-03 ENCOUNTER — Other Ambulatory Visit: Payer: Self-pay | Admitting: Internal Medicine

## 2023-07-03 DIAGNOSIS — I10 Essential (primary) hypertension: Secondary | ICD-10-CM

## 2023-07-11 ENCOUNTER — Other Ambulatory Visit: Payer: Self-pay | Admitting: Internal Medicine

## 2023-07-11 DIAGNOSIS — F418 Other specified anxiety disorders: Secondary | ICD-10-CM

## 2023-07-19 ENCOUNTER — Other Ambulatory Visit: Payer: Self-pay | Admitting: Internal Medicine

## 2023-07-21 DIAGNOSIS — C61 Malignant neoplasm of prostate: Secondary | ICD-10-CM | POA: Diagnosis not present

## 2023-07-28 DIAGNOSIS — I10 Essential (primary) hypertension: Secondary | ICD-10-CM | POA: Diagnosis not present

## 2023-07-28 DIAGNOSIS — Z13220 Encounter for screening for lipoid disorders: Secondary | ICD-10-CM | POA: Diagnosis not present

## 2023-07-28 DIAGNOSIS — C61 Malignant neoplasm of prostate: Secondary | ICD-10-CM | POA: Diagnosis not present

## 2023-07-28 DIAGNOSIS — G4733 Obstructive sleep apnea (adult) (pediatric): Secondary | ICD-10-CM | POA: Diagnosis not present

## 2023-07-28 DIAGNOSIS — Z0000 Encounter for general adult medical examination without abnormal findings: Secondary | ICD-10-CM | POA: Diagnosis not present

## 2023-07-28 DIAGNOSIS — F411 Generalized anxiety disorder: Secondary | ICD-10-CM | POA: Diagnosis not present

## 2023-08-17 ENCOUNTER — Telehealth: Payer: Self-pay | Admitting: Genetic Counselor

## 2023-08-17 NOTE — Telephone Encounter (Signed)
 Marland Kitchen

## 2023-09-05 DIAGNOSIS — Z713 Dietary counseling and surveillance: Secondary | ICD-10-CM | POA: Diagnosis not present

## 2023-09-16 ENCOUNTER — Other Ambulatory Visit: Payer: Self-pay | Admitting: Internal Medicine

## 2023-10-10 ENCOUNTER — Inpatient Hospital Stay

## 2023-10-10 ENCOUNTER — Inpatient Hospital Stay: Admitting: Genetic Counselor

## 2023-10-31 DIAGNOSIS — Z79899 Other long term (current) drug therapy: Secondary | ICD-10-CM | POA: Diagnosis not present

## 2023-10-31 DIAGNOSIS — Z2981 Encounter for HIV pre-exposure prophylaxis: Secondary | ICD-10-CM | POA: Diagnosis not present

## 2023-10-31 DIAGNOSIS — Z113 Encounter for screening for infections with a predominantly sexual mode of transmission: Secondary | ICD-10-CM | POA: Diagnosis not present

## 2023-11-09 ENCOUNTER — Other Ambulatory Visit: Payer: Self-pay | Admitting: Genetic Counselor

## 2023-11-09 ENCOUNTER — Encounter: Payer: Self-pay | Admitting: Genetic Counselor

## 2023-11-09 ENCOUNTER — Inpatient Hospital Stay: Attending: Medical Genetics | Admitting: Genetic Counselor

## 2023-11-09 ENCOUNTER — Inpatient Hospital Stay

## 2023-11-09 DIAGNOSIS — Z8042 Family history of malignant neoplasm of prostate: Secondary | ICD-10-CM

## 2023-11-09 DIAGNOSIS — Z1379 Encounter for other screening for genetic and chromosomal anomalies: Secondary | ICD-10-CM

## 2023-11-09 DIAGNOSIS — C61 Malignant neoplasm of prostate: Secondary | ICD-10-CM | POA: Diagnosis not present

## 2023-11-09 DIAGNOSIS — Z800 Family history of malignant neoplasm of digestive organs: Secondary | ICD-10-CM | POA: Diagnosis not present

## 2023-11-09 DIAGNOSIS — Z8546 Personal history of malignant neoplasm of prostate: Secondary | ICD-10-CM | POA: Diagnosis not present

## 2023-11-09 LAB — GENETIC SCREENING ORDER

## 2023-11-10 NOTE — Progress Notes (Signed)
 REFERRING PROVIDER: Florencio Hunting, MD  PRIMARY PROVIDER:  Arcadio Knuckles, MD  PRIMARY REASON FOR VISIT:  1. Prostate cancer (HCC)   2. Family history of prostate cancer   3. Family history of pancreatic cancer    HISTORY OF PRESENT ILLNESS:   Caleb Obrien, a 58 y.o. male, was seen for a [place] cancer genetics consultation at the request of Dr. Rozanne Corners due to a personal and family history of cancer.  Caleb Obrien presents to clinic today to discuss the possibility of a hereditary predisposition to cancer, to discuss genetic testing, and to further clarify his future cancer risks, as well as potential cancer risks for family members.   Caleb Obrien was diagnosed with prostate cancer at age 77 (Gleason 9).   Past Medical History:  Diagnosis Date   ALLERGIC RHINITIS CAUSE UNSPECIFIED 02/24/2009   Allergy    ASTHMA 02/05/2007   BENIGN PROSTATIC HYPERTROPHY, WITH URINARY OBSTRUCTION 05/22/2007   Cancer (HCC)    Prostate   Dermatophytosis of foot 11/02/2009   Emphysema of lung (HCC)    Very early   GANGLION CYST, WRIST, LEFT 03/25/2009   Hemangioma of skin and subcutaneous tissue 06/30/2009   HYPERLIPIDEMIA, BORDERLINE 04/21/2008   Hypertension    PLANTAR WART, RIGHT 01/23/2009   Sleep apnea    uses c-pap    Past Surgical History:  Procedure Laterality Date   COLONOSCOPY  2019   eyelid surgery  04/11/2018   Bil   LYMPHADENECTOMY Bilateral 10/24/2022   Procedure: BILATERAL PELVIC LYMPHADENECTOMY;  Surgeon: Florencio Hunting, MD;  Location: [facility];  Service: Urology;  Laterality: Bilateral;   POLYPECTOMY  2019   ROBOT ASSISTED LAPAROSCOPIC RADICAL PROSTATECTOMY N/A 10/24/2022   Procedure: XI ROBOTIC ASSISTED LAPAROSCOPIC RADICAL PROSTATECTOMY LEVEL 2;  Surgeon: Florencio Hunting, MD;  Location: [facility];  Service: Urology;  Laterality: N/A;  210 MINUTES NEEDED FOR CASE   WISDOM TOOTH EXTRACTION      Social History   Socioeconomic History   Marital status: Single     Spouse name: Not on file   Number of children: Not on file   Years of education: Not on file   Highest education level: Bachelor's degree (e.g., BA, AB, BS)  Occupational History   Occupation: HR    Employer: [employer]  Tobacco Use   Smoking status: Former    Current packs/day: 0.00    Types: Cigarettes    Quit date: 10/2017    Years since quitting: 6.0    Passive exposure: Never   Smokeless tobacco: Never  Vaping Use   Vaping status: Never Used  Substance and Sexual Activity   Alcohol use: Yes    Alcohol/week: 10.0 standard drinks of alcohol    Types: 10 Shots of liquor per week   Drug use: No   Sexual activity: Yes    Partners: Male  Other Topics Concern   Not on file  Social History Narrative   Not on file   Social Drivers of Health   Financial Resource Strain: Low Risk  (09/20/2022)   Overall Financial Resource Strain (CARDIA)    Difficulty of Paying Living Expenses: Not hard at all  Food Insecurity: No Food Insecurity (10/24/2022)   Hunger Vital Sign    Worried About Running Out of Food in the Last Year: Never true    Ran Out of Food in the Last Year: Never true  Transportation Needs: No Transportation Needs (10/24/2022)   PRAPARE - Transportation    Lack of Transportation (  Medical): No    Lack of Transportation (Non-Medical): No  Physical Activity: Unknown (09/20/2022)   Exercise Vital Sign    Days of Exercise per Week: 0 days    Minutes of Exercise per Session: Not on file  Stress: No Stress Concern Present (09/20/2022)   [employer] of Occupational Health - Occupational Stress Questionnaire    Feeling of Stress : Only a little  Social Connections: Socially Isolated (09/20/2022)   Social Connection and Isolation Panel [NHANES]    Frequency of Communication with Friends and Family: More than three times a week    Frequency of Social Gatherings with Friends and Family: Twice a week    Attends Religious Services: Never    Database administrator or  Organizations: No    Attends Engineer, structural: Not on file    Marital Status: Divorced     FAMILY HISTORY:  We obtained a detailed, 4-generation family history.  Significant diagnoses are listed below: Family History  Problem Relation Age of Onset   Hypertension Mother    Arthritis Mother    Prostate cancer Father 68 - 5   Healthy Sister    Bladder Cancer Paternal Uncle 26       met to bones   Lymphoma Maternal Grandmother        dx. >50   Pancreatic cancer Paternal Grandmother 65   Colon cancer Neg Hx    Esophageal cancer Neg Hx    Stomach cancer Neg Hx    Rectal cancer Neg Hx      Caleb Obrien is unaware of previous family history of genetic testing for hereditary cancer risks. There is no reported Ashkenazi Jewish ancestry.  GENETIC COUNSELING ASSESSMENT: Caleb Obrien is a 58 y.o. male with a personal and family history of cancer which is somewhat suggestive of a hereditary predisposition to cancer. We, therefore, discussed and recommended the following at today's visit.   DISCUSSION: We discussed that 5 - 10% of cancer is hereditary, with most cases of prostate cancer associated with BRCA1/2.  There are other genes that can be associated with hereditary prostate cancer syndromes.  We discussed that testing is beneficial for several reasons including knowing how to follow individuals after completing their treatment, identifying whether potential treatment options would be beneficial, and understanding if other family members could be at risk for cancer and allowing them to undergo genetic testing.   We reviewed the characteristics, features and inheritance patterns of hereditary cancer syndromes. We also discussed genetic testing, including the appropriate family members to test, the process of testing, insurance coverage and turn-around-time for results. We discussed the implications of a negative, positive, carrier and/or variant of uncertain significant result.  We recommended Caleb Obrien pursue genetic testing for a panel that includes genes associated with prostate, pancreatic, and bladder cancer.   Caleb Obrien was offered a common hereditary cancer panel (40 genes) and an expanded pan-cancer panel (77 genes). Caleb Obrien was informed of the benefits and limitations of each panel, including that expanded pan-cancer panels contain genes that do not have clear management guidelines at this point in time.  We also discussed that as the number of genes included on a panel increases, the chances of variants of uncertain significance increases. After considering the benefits and limitations of each gene panel, Caleb Obrien elected to have [facility] CancerNext-Expanded Panel.  The CancerNext-Expanded gene panel offered by [facility] and includes sequencing, rearrangement, and RNA analysis for the following 77 genes: AIP, ALK, APC,  ATM, AXIN2, BAP1, BARD1, BMPR1A, BRCA1, BRCA2, BRIP1, CDC73, CDH1, CDK4, CDKN1B, CDKN2A, CEBPA, CHEK2, CTNNA1, DDX41, DICER1, ETV6, FH, FLCN, GATA2, LZTR1, MAX, MBD4, MEN1, MET, MLH1, MSH2, MSH3, MSH6, MUTYH, NF1, NF2, NTHL1, PALB2, PHOX2B, PMS2, POT1, PRKAR1A, PTCH1, PTEN, RAD51C, RAD51D, RB1, RET, RPS20, RUNX1, SDHA, SDHAF2, SDHB, SDHC, SDHD, SMAD4, SMARCA4, SMARCB1, SMARCE1, STK11, SUFU, TMEM127, TP53, TSC1, TSC2, VHL, and WT1 (sequencing and deletion/duplication); EGFR, HOXB13, KIT, MITF, PDGFRA, POLD1, and POLE (sequencing only); EPCAM and GREM1 (deletion/duplication only).    Based on Caleb Obrien personal and family history of cancer, he meets medical criteria for genetic testing. Despite that he meets criteria, he may still have an out of pocket cost. We discussed that if his out of pocket cost for testing is over $100, the laboratory should contact them to discuss self-pay prices, patient pay assistance programs, if applicable, and other billing options.  PLAN: After considering the risks, benefits, and limitations, Mr.  Obrien provided informed consent to pursue genetic testing and the blood sample was sent to [facility] for analysis of the CancerNext-Expanded Panel. Results should be available within approximately 2-3 weeks' time, at which point they will be disclosed by telephone to Caleb Obrien, as will any additional recommendations warranted by these results. Caleb Obrien will receive a summary of his genetic counseling visit and a copy of his results once available. This information will also be available in Epic.   Caleb Obrien questions were answered to his satisfaction today. Our contact information was provided should additional questions or concerns arise. Thank you for the referral and allowing us  to share in the care of your patient.   Fleetwood Pierron, MS, [facility] Genetic Counselor [email or web address] (P) [phone]  40 minutes were spent on the date of the encounter in service to the patient including preparation, face-to-face consultation, documentation and care coordination. The patient was seen alone.  Drs. Gudena and/or Maryalice Smaller were available to discuss this case as needed.   _______________________________________________________________________ For Office Staff:  Number of people involved in session: 1 Was an Intern/ student involved with case: no

## 2023-11-30 ENCOUNTER — Encounter: Payer: Self-pay | Admitting: Genetic Counselor

## 2023-11-30 ENCOUNTER — Telehealth: Payer: Self-pay | Admitting: Genetic Counselor

## 2023-11-30 DIAGNOSIS — Z1379 Encounter for other screening for genetic and chromosomal anomalies: Secondary | ICD-10-CM | POA: Insufficient documentation

## 2023-11-30 NOTE — Telephone Encounter (Addendum)
 I contacted Mr. Arney to discuss his genetic testing results. No pathogenic variants were identified in the 77 genes analyzed. Detailed clinic note to follow.  The test report has been scanned into EPIC and is located under the Molecular Pathology section of the Results Review tab.  A portion of the result report is included below for reference.   Amanpreet Delmont, MS, [facility] Genetic Counselor [email or web address] (P) [phone]

## 2023-12-04 ENCOUNTER — Ambulatory Visit: Payer: Self-pay | Admitting: Genetic Counselor

## 2023-12-04 DIAGNOSIS — Z1379 Encounter for other screening for genetic and chromosomal anomalies: Secondary | ICD-10-CM

## 2023-12-04 NOTE — Progress Notes (Signed)
 HPI:   Mr. Dacus was previously seen in the [facility] clinic due to a personal and family history of cancer and concerns regarding a hereditary predisposition to cancer. Please refer to our prior cancer genetics clinic note for more information regarding our discussion, assessment and recommendations, at the time. Mr. Carnell recent genetic test results were disclosed to him, as were recommendations warranted by these results. These results and recommendations are discussed in more detail below.  FAMILY HISTORY:  We obtained a detailed, 4-generation family history.  Significant diagnoses are listed below:      Family History  Problem Relation Age of Onset   Hypertension Mother     Arthritis Mother     Prostate cancer Father 40 - 54   Healthy Sister     Bladder Cancer Paternal Uncle 47        met to bones   Lymphoma Maternal Grandmother          dx. >50   Pancreatic cancer Paternal Grandmother 91   Colon cancer Neg Hx     Esophageal cancer Neg Hx     Stomach cancer Neg Hx     Rectal cancer Neg Hx             Mr. Rames is unaware of previous family history of genetic testing for hereditary cancer risks. There is no reported Ashkenazi Jewish ancestry.  GENETIC TEST RESULTS:  The [facility] CancerNext-Expanded Panel found no pathogenic mutations.  The CancerNext-Expanded gene panel offered by [facility] and includes sequencing, rearrangement, and RNA analysis for the following 77 genes: AIP, ALK, APC, ATM, AXIN2, BAP1, BARD1, BMPR1A, BRCA1, BRCA2, BRIP1, CDC73, CDH1, CDK4, CDKN1B, CDKN2A, CEBPA, CHEK2, CTNNA1, DDX41, DICER1, ETV6, FH, FLCN, GATA2, LZTR1, MAX, MBD4, MEN1, MET, MLH1, MSH2, MSH3, MSH6, MUTYH, NF1, NF2, NTHL1, PALB2, PHOX2B, PMS2, POT1, PRKAR1A, PTCH1, PTEN, RAD51C, RAD51D, RB1, RET, RPS20, RUNX1, SDHA, SDHAF2, SDHB, SDHC, SDHD, SMAD4, SMARCA4, SMARCB1, SMARCE1, STK11, SUFU, TMEM127, TP53, TSC1, TSC2, VHL, and WT1 (sequencing and  deletion/duplication); EGFR, HOXB13, KIT, MITF, PDGFRA, POLD1, and POLE (sequencing only); EPCAM and GREM1 (deletion/duplication only).   The test report has been scanned into EPIC and is located under the Molecular Pathology section of the Results Review tab.  A portion of the result report is included below for reference. Genetic testing reported out on 11/19/2023.        Even though a pathogenic variant was not identified, possible explanations for the cancer in the family may include: There may be no hereditary risk for cancer in the family. The cancers in Mr. Plain and/or his family may be due to other genetic or environmental factors. There may be a gene mutation in one of these genes that current testing methods cannot detect, but that chance is small. There could be another gene that has not yet been discovered, or that we have not yet tested, that is responsible for the cancer diagnoses in the family.  It is also possible there is a hereditary cause for the cancer in the family that Mr. Riera did not inherit.  Therefore, it is important to remain in touch with cancer genetics in the future so that we can continue to offer Mr. Lapoint the most up to date genetic testing.   ADDITIONAL GENETIC TESTING:  We discussed with Mr. Lankford that his genetic testing was fairly extensive.  If there are genes identified to increase cancer risk that can be analyzed in the future, we would be happy to discuss  and coordinate this testing at that time.    CANCER SCREENING RECOMMENDATIONS:  Mr. Whyte test result is considered negative (normal).  This means that we have not identified a hereditary cause for his personal and family history of cancer at this time.   An individual's cancer risk and medical management are not determined by genetic test results alone. Overall cancer risk assessment incorporates additional factors, including personal medical history, family history, and any  available genetic information that may result in a personalized plan for cancer prevention and surveillance. Therefore, it is recommended he continue to follow the cancer management and screening guidelines provided by his oncology and primary healthcare provider.  RECOMMENDATIONS FOR FAMILY MEMBERS:   Since he did not inherit a mutation in a cancer predisposition gene included on this panel, his son could not have inherited a mutation from him in one of these genes. Other members of the family may still carry a pathogenic variant in one of these genes that Mr. Meddaugh did not inherit. Based on the family history, we recommend his father have genetic counseling and testing.  His son is recommended to begin prostate cancer screening at age 57 (10 years prior to his age at diagnosis).  FOLLOW-UP:  Cancer genetics is a rapidly advancing field and it is possible that new genetic tests will be appropriate for him and/or his family members in the future. We encouraged him to remain in contact with cancer genetics on an annual basis so we can update his personal and family histories and let him know of advances in cancer genetics that may benefit this family.   Our contact number was provided. Mr. Arp questions were answered to his satisfaction, and he knows he is welcome to call us  at anytime with additional questions or concerns.   Shaina Gullatt, MS, [facility] Genetic Counselor [email or web address] (P) [phone]

## 2024-01-01 ENCOUNTER — Encounter: Payer: Self-pay | Admitting: *Deleted

## 2024-01-01 NOTE — Telephone Encounter (Signed)
 Supply order for 2 months faxed to [facility] [phone]. Received a receipt of confirmation.

## 2024-01-01 NOTE — Telephone Encounter (Signed)
 Pt called and scheduled an appt. MD did not have any till September and pt is needing supplies before that. Please advise.

## 2024-01-01 NOTE — Telephone Encounter (Signed)
 Yes , please give patient supplies until he can be seen. CD

## 2024-01-05 DIAGNOSIS — G4733 Obstructive sleep apnea (adult) (pediatric): Secondary | ICD-10-CM | POA: Diagnosis not present

## 2024-01-26 DIAGNOSIS — Z113 Encounter for screening for infections with a predominantly sexual mode of transmission: Secondary | ICD-10-CM | POA: Diagnosis not present

## 2024-01-26 DIAGNOSIS — Z79899 Other long term (current) drug therapy: Secondary | ICD-10-CM | POA: Diagnosis not present

## 2024-01-26 DIAGNOSIS — E7800 Pure hypercholesterolemia, unspecified: Secondary | ICD-10-CM | POA: Diagnosis not present

## 2024-01-26 DIAGNOSIS — Z2981 Encounter for HIV pre-exposure prophylaxis: Secondary | ICD-10-CM | POA: Diagnosis not present

## 2024-01-29 ENCOUNTER — Other Ambulatory Visit (HOSPITAL_BASED_OUTPATIENT_CLINIC_OR_DEPARTMENT_OTHER): Payer: Self-pay | Admitting: Family Medicine

## 2024-01-29 DIAGNOSIS — E7800 Pure hypercholesterolemia, unspecified: Secondary | ICD-10-CM

## 2024-01-31 DIAGNOSIS — R051 Acute cough: Secondary | ICD-10-CM | POA: Diagnosis not present

## 2024-02-08 ENCOUNTER — Encounter: Payer: Self-pay | Admitting: Neurology

## 2024-02-13 ENCOUNTER — Encounter: Payer: Self-pay | Admitting: Neurology

## 2024-02-13 ENCOUNTER — Ambulatory Visit (INDEPENDENT_AMBULATORY_CARE_PROVIDER_SITE_OTHER): Admitting: Neurology

## 2024-02-13 VITALS — BP 118/76 | HR 86 | Ht 67.0 in | Wt 179.0 lb

## 2024-02-13 DIAGNOSIS — G4733 Obstructive sleep apnea (adult) (pediatric): Secondary | ICD-10-CM | POA: Diagnosis not present

## 2024-02-13 DIAGNOSIS — K219 Gastro-esophageal reflux disease without esophagitis: Secondary | ICD-10-CM

## 2024-02-13 NOTE — Progress Notes (Signed)
 SABRA

## 2024-02-13 NOTE — Patient Instructions (Signed)
 Living With Sleep Apnea Sleep apnea is a condition that affects your breathing while you're sleeping. Your tongue or the tissue in your throat may block the flow of air while you sleep. You may have shallow breathing or stop breathing for short periods of time. The breaks in breathing interrupt the deep sleep that you need to feel rested. Even if you don't wake up from the gaps in breathing, you may feel tired during the day. People with sleep apnea may snore loudly. You may have a headache in the morning and feel anxious or depressed. How can sleep apnea affect me? Sleep apnea increases your chances of being very tired during the day. This is called daytime fatigue. Sleep apnea can also increase your risk of: Heart attack. Stroke. Obesity. Type 2 diabetes. Heart failure. Irregular heartbeat. High blood pressure. If you are very tired during the day, you may be more likely to: Not do well in school or at work. Fall asleep while driving. Have trouble paying attention. Develop depression or anxiety. Have problems having sex. This is called sexual dysfunction. What actions can I take to manage sleep apnea? Sleep apnea treatment  If you were given a device to open your airway while you sleep, use it only as told by your health care provider. You may be given: An oral appliance. This is a mouthpiece that shifts your lower jaw forward. A continuous positive airway pressure (CPAP) device. This blows air through a mask. A nasal expiratory positive airway pressure (EPAP) device. This has valves that you put into each nostril. A bi-level positive airway pressure (BIPAP) device. This blows air through a mask when you breathe in and breathe out. You may need surgery if other treatments don't work for you. Sleep habits Go to sleep and wake up at the same time every day. This helps set your internal clock for sleeping. If you stay up later than usual on weekends, try to get up in the morning  within 2 hours of the time you usually wake up. Try to get at least 7-9 hours of sleep each night. Stop using a computer, tablet, and mobile phone a few hours before bedtime. Do not take long naps during the day. If you nap, limit it to 30 minutes. Have a relaxing bedtime routine. Reading or listening to music may relax you and help you sleep. Use your bedroom only for sleep. Keep your television and computer out of your bedroom. Keep your bedroom cool, dark, and quiet. Use a supportive mattress and pillows. Follow your provider's instructions for other changes to sleep habits. Nutrition Do not eat big meals in the evening. Do not have caffeine in the later part of the day. The effects of caffeine can last for more than 5 hours. Follow your provider's instructions for any changes to what you eat and drink. Lifestyle Do not drink alcohol before bedtime. Alcohol can cause you to fall asleep at first, but then it can cause you to wake up in the middle of the night and have trouble getting back to sleep. Do not smoke, vape, or use nicotine or tobacco. Medicines Take over-the-counter and prescription medicines only as told by your provider. Do not use over-the-counter sleep medicine. You may become dependent on this medicine, and it can make sleep apnea worse. Do not take medicines, such as sedatives and narcotics, unless told to by your provider. Activity Exercise on most days, but avoid exercising in the evening. Exercising near bedtime can  interfere with sleeping. If possible, spend time outside every day. Natural light helps with your internal clock. General information Lose weight if you need to. Stay at a healthy weight. If you are having surgery, make sure to tell your provider that you have sleep apnea. You may need to bring your device with you. Keep all follow-up visits. Your provider will want to check on your condition. Where to find more information National Heart, Lung, and  Blood Institute: [place].[employer] This information is not intended to replace advice given to you by your health care provider. Make sure you discuss any questions you have with your health care provider. Document Revised: 09/21/2022 Document Reviewed: 09/21/2022 Elsevier Patient Education  2024 Elsevier Inc.    Dr Joshua, MD    Today's ASSESSMENT AND PLAN :   58 -year- old male OSA patient and compliant CPAP user for almost 8 years , here with his first and only CPAP machine :    1) Well controlled OSA on CPAP, compliant user,  machine is working fine and he is not in the position to change to a new one, due to costs-  high deductible.   2) some weight gain over 5 years but BMI is 28 room 26 .   3) no ESS of significance. ( 1/ 24 points ) . Good quality sleep/    May have a RV with NP in 12 months or earlier if  his CPAP displays a message  of end of motor life  being reached.  In that case , needs HST repeat.

## 2024-02-13 NOTE — Progress Notes (Addendum)
 Provider:  Dedra Gores, MD  Primary Care Physician/ Referring Provider:  Dr Regino          Chief Complaint according to patient   Patient presents with:                HISTORY OF PRESENT ILLNESS:  Caleb Obrien is a 58 y.o. male patient who is here for revisit 02/13/2024 for  CPAP follow up , last seen in 2023.  Had a negative ONO at the time, has not had a new CPAP in 7 years   Chief concern according to patient :  My CPAP works fine, my sleep is good, no interval medical history, still being treated for GERD,  ESS 1 point  FSS 15 points,   Medication reviewed.  CPAP compliance is good , 77% for hours of use. No sleep aid needed.    Caleb Obrien is a 58 y.o. male and is here for a regular yearly CPAP follow-up.  The patient has been a compliant CPAP user his blood pressure is well controlled he is not diabetic and in the meantime since his last visit with me he has quit smoking in May 2019.  He also has undergone a blepharoplasty to correct ptosis.  He states that there are no difficulties with using CPAP and the results is very positive.  He uses the machine 30 out of 30 days with 75% compliance for 4 hours or more of consecutive use.  Average user time is 5 hours 29 minutes, he is using an AutoSet between 5 and 10 cmH2O with 3 cm EPR and the residual AHI is 0.5 which speaks for a good resolution of apnea.  The 95th percentile pressure is 7.8 and there are equal numbers of central and obstructive apneas within the residual count.  He has minimal air leakage at this pressure the amount is not varying very much from night to night. He uses nasal pillows.   [name] underwent a sleep study as an  attended [facility]- 2015,  about 3 years ago and afterwards was referred to for dental treatment with a mandibular advancement device. His claim was denied by his health insurance and resulted in a lot of costs to him. He never had the device made. He has used a dental  device that he bought on the Internet, but he is not sure that it has helped. He is known to be a loud snorer and his snoring may not have been reduced at all as he describes, he also reports that he had choking and apnea attacks waking him out of sleep and these have continued while he used a new device. Snoring has been ongoing as has choking . He has not had a good night sleep, not refreshing nor of restoring quality. He is excessivly sleepy while driving, has tried a lot of caffeine through out the day and scheduled stops - with minimal success. His overnight sleep deteriorated further while using caffeine.       Review of Systems: Out of a complete 14 system review, the patient complains of only the following symptoms, and all other reviewed systems are negative.:   SLEEPINESS ?  How likely are you to doze in the following situations: 0 = not likely, 1 = slight chance, 2 = moderate chance, 3 = high chance  Sitting and Reading? Watching Television? Sitting inactive in a public place (theater or meeting)? Lying down in the afternoon  when circumstances permit? Sitting and talking to someone? Sitting quietly after lunch without alcohol? In a car, while stopped for a few minutes in traffic? As a passenger in a car for an hour without a break?  Total =  1        Social History   Socioeconomic History   Marital status: Single    Spouse name: Not on file   Number of children: Not on file   Years of education: Not on file   Highest education level: Bachelor's degree (e.g., BA, AB, BS)  Occupational History   Occupation: HR    Employer: [employer]  Tobacco Use   Smoking status: Former    Current packs/day: 0.00    Types: Cigarettes    Quit date: 10/2017    Years since quitting: 6.3    Passive exposure: Never   Smokeless tobacco: Never  Vaping Use   Vaping status: Never Used  Substance and Sexual Activity   Alcohol use: Yes    Alcohol/week: 3.0 standard  drinks of alcohol    Types: 3 Shots of liquor per week    Comment: occasionally   Drug use: No   Sexual activity: Yes    Partners: Male  Other Topics Concern   Not on file  Social History Narrative   Not on file   Social Drivers of Health   Financial Resource Strain: Low Risk  (09/20/2022)   Overall Financial Resource Strain (CARDIA)    Difficulty of Paying Living Expenses: Not hard at all  Food Insecurity: No Food Insecurity (10/24/2022)   Hunger Vital Sign    Worried About Running Out of Food in the Last Year: Never true    Ran Out of Food in the Last Year: Never true  Transportation Needs: No Transportation Needs (10/24/2022)   PRAPARE - Administrator, Civil Service (Medical): No    Lack of Transportation (Non-Medical): No  Physical Activity: Unknown (09/20/2022)   Exercise Vital Sign    Days of Exercise per Week: 0 days    Minutes of Exercise per Session: Not on file  Stress: No Stress Concern Present (09/20/2022)   [employer] of Occupational Health - Occupational Stress Questionnaire    Feeling of Stress : Only a little  Social Connections: Socially Isolated (09/20/2022)   Social Connection and Isolation Panel    Frequency of Communication with Friends and Family: More than three times a week    Frequency of Social Gatherings with Friends and Family: Twice a week    Attends Religious Services: Never    Database administrator or Organizations: No    Attends Engineer, structural: Not on file    Marital Status: Divorced    Family History  Problem Relation Age of Onset   Hypertension Mother    Arthritis Mother    Prostate cancer Father 55 - 37   Healthy Sister    Bladder Cancer Paternal Uncle 45       met to bones   Lymphoma Maternal Grandmother        dx. >50   Pancreatic cancer Paternal Grandmother 46   Colon cancer Neg Hx    Esophageal cancer Neg Hx    Stomach cancer Neg Hx    Rectal cancer Neg Hx     Past Medical History:  Diagnosis  Date   ALLERGIC RHINITIS CAUSE UNSPECIFIED 02/24/2009   Allergy    ASTHMA 02/05/2007   BENIGN PROSTATIC HYPERTROPHY, WITH URINARY OBSTRUCTION 05/22/2007  Cancer [facility])    Prostate   Dermatophytosis of foot 11/02/2009   Emphysema of lung (HCC)    Very early   GANGLION CYST, WRIST, LEFT 03/25/2009   Hemangioma of skin and subcutaneous tissue 06/30/2009   HYPERLIPIDEMIA, BORDERLINE 04/21/2008   Hypertension    PLANTAR WART, RIGHT 01/23/2009   Sleep apnea    uses c-pap    Past Surgical History:  Procedure Laterality Date   COLONOSCOPY  2019   eyelid surgery  04/11/2018   Bil   LYMPHADENECTOMY Bilateral 10/24/2022   Procedure: BILATERAL PELVIC LYMPHADENECTOMY;  Surgeon: Renda Glance, MD;  Location: [facility];  Service: Urology;  Laterality: Bilateral;   POLYPECTOMY  2019   ROBOT ASSISTED LAPAROSCOPIC RADICAL PROSTATECTOMY N/A 10/24/2022   Procedure: XI ROBOTIC ASSISTED LAPAROSCOPIC RADICAL PROSTATECTOMY LEVEL 2;  Surgeon: Renda Glance, MD;  Location: [facility];  Service: Urology;  Laterality: N/A;  210 MINUTES NEEDED FOR CASE   WISDOM TOOTH EXTRACTION       Current Outpatient Medications on File Prior to Visit  Medication Sig Dispense Refill   Albuterol -Budesonide  (AIRSUPRA ) 90-80 MCG/ACT AERO Inhale 2 puffs into the lungs 4 (four) times daily. (Patient taking differently: Inhale 2 puffs into the lungs 4 (four) times daily as needed.) 32.1 g 1   ALPRAZolam  (XANAX ) 0.25 MG tablet TAKE 1/2 TABLET BY MOUTH DAILY AS NEEDED 30 tablet 0   Ascorbic Acid (VITAMIN C PO) Take by mouth.     atorvastatin  (LIPITOR) 40 MG tablet TAKE 1 TABLET BY MOUTH DAILY 90 tablet 0   Cyanocobalamin  (VITAMIN B12 PO) Take by mouth.     dexlansoprazole  (DEXILANT ) 60 MG capsule TAKE 1 CAPSULE BY MOUTH EVERY DAY 90 capsule 1   emtricitabine -tenofovir  (TRUVADA) 200-300 MG tablet Take 1 tablet by mouth daily. 30 tablet 0   indapamide  (LOZOL ) 1.25 MG tablet TAKE 1 TABLET BY MOUTH DAILY 90 tablet 0   irbesartan   (AVAPRO ) 300 MG tablet TAKE 1 TABLET BY MOUTH DAILY 90 tablet 0   MILK THISTLE PO Take by mouth.     tadalafil  (CIALIS ) 5 MG tablet Take 5 mg by mouth daily.     VITAMIN D  PO Take by mouth.     No current facility-administered medications on file prior to visit.    Allergies  Allergen Reactions   Amlodipine      cough   Lisinopril      cough   Sudafed [Pseudoephedrine Hcl] Other (See Comments)    prostatism     DIAGNOSTIC DATA (LABS, IMAGING, TESTING) - I reviewed patient records, labs, notes, testing and imaging myself where available.  Lab Results  Component Value Date   WBC 5.0 04/12/2023   HGB 13.2 04/12/2023   HCT 39.5 04/12/2023   MCV 105.8 (H) 04/12/2023   PLT 354.0 04/12/2023      Component Value Date/Time   NA 138 04/12/2023 0911   K 4.4 04/12/2023 0911   CL 99 04/12/2023 0911   CO2 31 04/12/2023 0911   GLUCOSE 97 04/12/2023 0911   BUN 17 04/12/2023 0911   CREATININE 0.96 04/12/2023 0911   CALCIUM  9.6 04/12/2023 0911   PROT 7.5 04/12/2023 0911   ALBUMIN 4.4 04/12/2023 0911   AST 33 04/12/2023 0911   ALT 41 04/12/2023 0911   ALKPHOS 75 04/12/2023 0911   BILITOT 0.4 04/12/2023 0911   GFRNONAA >60 10/24/2022 1124   GFRAA 95 01/10/2008 0931   Lab Results  Component Value Date   CHOL 155 04/12/2023   HDL 46.50 04/12/2023  LDLCALC 80 04/12/2023   LDLDIRECT 144.4 01/27/2012   TRIG 145.0 04/12/2023   CHOLHDL 3 04/12/2023   No results found for: HGBA1C Lab Results  Component Value Date   VITAMINB12 639 04/12/2023   Lab Results  Component Value Date   TSH 3.41 06/09/2022    PHYSICAL EXAM:  Vitals:   02/13/24 0915  BP: 118/76  Pulse: 86   No data found. Body mass index is 28.04 kg/m.   Wt Readings from Last 3 Encounters:  02/13/24 179 lb (81.2 kg)  05/24/23 175 lb (79.4 kg)  05/17/23 175 lb (79.4 kg)     Ht Readings from Last 3 Encounters:  02/13/24 5' 7 (1.702 m)  05/24/23 5' 6 (1.676 m)  05/17/23 5' 6 (1.676 m)       General: The patient is awake, alert and appears not in acute distress and groomed. Head: Normocephalic, atraumatic.  Neck is supple.Mallampati  2 neck circumference: 17  from 16.5 .  Nasal airflow partially constricted ,  TMJ click evident . Retrognathia is  Not seen.  Cardiovascular:  Regular rate and rhythm, without murmurs or carotid bruit, and without distended neck veins. Respiratory: Lungs are clear to auscultation. Skin:  Without evidence of edema, or rash Trunk: BMI is 28 . The patient's posture is erect    Neurologic exam : The patient is awake and alert, oriented to place and time.   Attention span & concentration ability appears normal.  Speech is fluent, without dysarthria, dysphonia or aphasia.  Mood and affect are appropriate.   Cranial nerves: Pupils are equal and briskly reactive to light. Extraocular movements  in vertical and horizontal planes intact and without nystagmus.  Facial motor strength is symmetric and tongue and uvula move midline. Shoulder shrug was symmetrical.   Motor: Strengths are 5/5 throughout. Muscle bulk and tone are normal. No tremors.  Coordination: No ataxia or dysmetria.  Sensory: Grossly intact throughout to all modalities. Reflexes: Normal and symmetric throughout.   ASSESSMENT AND PLAN :   58 y.o. year old male OSA patient and compliant CPAP user for almost 8 years , here with his first and only CPAP machine :    1) Well controlled OSA on CPAP, compliant user,  machine is working fine and he is not in the position to change to a new one, due to costs-  high deductible.   2) some weight gain over 5 years but BMI is 28 room 26 .   3) no ESS of significance. ( 1/ 24 points ) . Good quality sleep/    May have a RV with NP in 12 months or earlier if  his CPAP displays a message  of end of motor life  being reached.  In that case , needs HST repeat.    Addendum : The patients primary care physician has changed and he is now  seeing Dr Elfrieda Haggard, MD   I would like to thank Dr Haggard for allowing me to meet with this pleasant patient.   Sleep Clinic Patients are generally offered input on sleep hygiene, life style changes and how to improve compliance with medical treatment where applicable. Review and reiteration of good sleep hygiene measures is offered to any sleep clinic patient, be it in the first consultation or with any follow up visits.  Any patient with sleepiness should be cautioned not to drive, work at heights, or operate dangerous or heavy equipment when feeling tired or sleepy.   The patient will be  seen in follow-up in the sleep clinic at [facility] for discussion of test results, sleep related symptoms and treatment compliance review, further management strategies, etc.   The referring provider will be notified of the test results.   The patient's condition requires frequent monitoring and adjustments in the treatment plan, reflecting the ongoing complexity of care.  This provider is the continuing focal point for all needed services for this condition.  After spending a total time of  20  minutes face to face and time for  history taking, physical and neurologic examination, review of laboratory studies,  personal review of imaging studies, reports and results of other testing and review of referral information / records as far as provided in visit,   Electronically signed by: Dedra Gores, MD 02/13/2024 10:03 AM  [facility] Board certified by The [employer] of Sleep Medicine and Diplomate of the [employer] of Sleep Medicine. Board certified In Neurology through the ABPN, Fellow of the [employer] of Neurology.

## 2024-02-14 DIAGNOSIS — C61 Malignant neoplasm of prostate: Secondary | ICD-10-CM | POA: Diagnosis not present

## 2024-02-21 DIAGNOSIS — N5201 Erectile dysfunction due to arterial insufficiency: Secondary | ICD-10-CM | POA: Diagnosis not present

## 2024-02-21 DIAGNOSIS — C61 Malignant neoplasm of prostate: Secondary | ICD-10-CM | POA: Diagnosis not present

## 2024-02-23 DIAGNOSIS — Z23 Encounter for immunization: Secondary | ICD-10-CM | POA: Diagnosis not present

## 2024-02-26 ENCOUNTER — Ambulatory Visit (HOSPITAL_BASED_OUTPATIENT_CLINIC_OR_DEPARTMENT_OTHER)
Admission: RE | Admit: 2024-02-26 | Discharge: 2024-02-26 | Disposition: A | Discharge status: Home / Self Care | Payer: Self-pay | Source: Ambulatory Visit | Attending: Family Medicine | Admitting: Family Medicine

## 2024-02-26 DIAGNOSIS — E7800 Pure hypercholesterolemia, unspecified: Secondary | ICD-10-CM | POA: Insufficient documentation

## 2024-03-19 DIAGNOSIS — L309 Dermatitis, unspecified: Secondary | ICD-10-CM | POA: Diagnosis not present

## 2024-03-20 ENCOUNTER — Inpatient Hospital Stay
Admission: RE | Admit: 2024-03-20 | Discharge: 2024-03-20 | Disposition: A | Source: Ambulatory Visit | Attending: Acute Care | Admitting: Acute Care

## 2024-03-20 ENCOUNTER — Encounter: Payer: Self-pay | Admitting: Neurology

## 2024-03-20 DIAGNOSIS — Z87891 Personal history of nicotine dependence: Secondary | ICD-10-CM | POA: Diagnosis not present

## 2024-03-20 DIAGNOSIS — Z122 Encounter for screening for malignant neoplasm of respiratory organs: Secondary | ICD-10-CM

## 2024-03-22 ENCOUNTER — Other Ambulatory Visit: Payer: Self-pay

## 2024-03-22 DIAGNOSIS — Z122 Encounter for screening for malignant neoplasm of respiratory organs: Secondary | ICD-10-CM

## 2024-03-22 DIAGNOSIS — Z87891 Personal history of nicotine dependence: Secondary | ICD-10-CM

## 2024-03-28 ENCOUNTER — Telehealth: Payer: Self-pay

## 2024-03-28 NOTE — Telephone Encounter (Signed)
 Copied from CRM [id number]. Topic: General - Other >> Mar 25, 2024  1:19 PM Caleb Obrien wrote: Reason for CRM:   Pt contacting office regarding the ordered CT of chest by [place]. Pt has changed PCPs and is no longer seeing Dr. Joshua. He would like the results from scan to go to his new PCP Dr. Elfrieda Michael.   He would like Dr. Joshua removed from his chart. Advised he would need to contact PCP office, but he would like confirmation that he will need to reach out to have PCP changed. After getting off call, realized the PCP has already been changed.  CB#  [phone]  PCP has been changed will fax to new pcp office

## 2024-04-12 ENCOUNTER — Other Ambulatory Visit: Payer: Self-pay | Admitting: Internal Medicine

## 2024-04-30 DIAGNOSIS — Z79899 Other long term (current) drug therapy: Secondary | ICD-10-CM | POA: Diagnosis not present

## 2024-04-30 DIAGNOSIS — Z113 Encounter for screening for infections with a predominantly sexual mode of transmission: Secondary | ICD-10-CM | POA: Diagnosis not present

## 2024-04-30 DIAGNOSIS — I10 Essential (primary) hypertension: Secondary | ICD-10-CM | POA: Diagnosis not present

## 2024-04-30 DIAGNOSIS — Z2981 Encounter for HIV pre-exposure prophylaxis: Secondary | ICD-10-CM | POA: Diagnosis not present

## 2024-05-08 DIAGNOSIS — J020 Streptococcal pharyngitis: Secondary | ICD-10-CM | POA: Diagnosis not present

## 2025-02-18 ENCOUNTER — Telehealth: Admitting: Adult Health
